# Patient Record
Sex: Male | Born: 1935 | Race: White | Hispanic: No | Smoking: Never smoker
Health system: Southern US, Community
[De-identification: ages and names within clinical notes are randomized; demographics above are authoritative.]

## PROBLEM LIST (undated history)

## (undated) DIAGNOSIS — M1990 Unspecified osteoarthritis, unspecified site: Secondary | ICD-10-CM

## (undated) DIAGNOSIS — C439 Malignant melanoma of skin, unspecified: Secondary | ICD-10-CM

## (undated) DIAGNOSIS — I10 Essential (primary) hypertension: Secondary | ICD-10-CM

## (undated) DIAGNOSIS — Z950 Presence of cardiac pacemaker: Secondary | ICD-10-CM

## (undated) DIAGNOSIS — N189 Chronic kidney disease, unspecified: Secondary | ICD-10-CM

## (undated) DIAGNOSIS — I82409 Acute embolism and thrombosis of unspecified deep veins of unspecified lower extremity: Secondary | ICD-10-CM

## (undated) DIAGNOSIS — D689 Coagulation defect, unspecified: Secondary | ICD-10-CM

## (undated) DIAGNOSIS — I4891 Unspecified atrial fibrillation: Secondary | ICD-10-CM

## (undated) HISTORY — PX: INSERT / REPLACE / REMOVE PACEMAKER: SUR710

## (undated) HISTORY — PX: CHOLECYSTECTOMY: SHX55

---

## 2004-12-12 ENCOUNTER — Ambulatory Visit: Payer: Self-pay | Admitting: Internal Medicine

## 2006-06-12 ENCOUNTER — Ambulatory Visit: Payer: Self-pay | Admitting: Internal Medicine

## 2007-07-12 ENCOUNTER — Ambulatory Visit: Payer: Self-pay | Admitting: Internal Medicine

## 2010-11-22 ENCOUNTER — Ambulatory Visit: Payer: Self-pay

## 2010-11-22 ENCOUNTER — Inpatient Hospital Stay: Payer: Self-pay | Admitting: Surgery

## 2010-11-28 LAB — PATHOLOGY REPORT

## 2011-04-07 ENCOUNTER — Ambulatory Visit: Payer: Self-pay | Admitting: Internal Medicine

## 2011-05-03 ENCOUNTER — Ambulatory Visit: Payer: Self-pay | Admitting: Gastroenterology

## 2011-09-04 ENCOUNTER — Encounter: Payer: Self-pay | Admitting: Sports Medicine

## 2011-10-04 ENCOUNTER — Encounter: Payer: Self-pay | Admitting: Sports Medicine

## 2012-09-17 ENCOUNTER — Inpatient Hospital Stay: Payer: Self-pay | Admitting: Internal Medicine

## 2012-09-17 LAB — CBC
HGB: 11.5 g/dL — ABNORMAL LOW (ref 13.0–18.0)
MCH: 33.5 pg (ref 26.0–34.0)
MCHC: 32.6 g/dL (ref 32.0–36.0)
Platelet: 133 10*3/uL — ABNORMAL LOW (ref 150–440)
RBC: 3.44 10*6/uL — ABNORMAL LOW (ref 4.40–5.90)
WBC: 9.1 10*3/uL (ref 3.8–10.6)

## 2012-09-17 LAB — COMPREHENSIVE METABOLIC PANEL
Albumin: 3.5 g/dL (ref 3.4–5.0)
Alkaline Phosphatase: 67 U/L (ref 50–136)
Anion Gap: 6 — ABNORMAL LOW (ref 7–16)
BUN: 30 mg/dL — ABNORMAL HIGH (ref 7–18)
Bilirubin,Total: 0.7 mg/dL (ref 0.2–1.0)
Calcium, Total: 8.6 mg/dL (ref 8.5–10.1)
Co2: 23 mmol/L (ref 21–32)
EGFR (African American): 43 — ABNORMAL LOW
Osmolality: 292 (ref 275–301)
Potassium: 4.2 mmol/L (ref 3.5–5.1)
Sodium: 143 mmol/L (ref 136–145)

## 2012-09-17 LAB — PROTIME-INR
INR: 2.4
Prothrombin Time: 25.8 secs — ABNORMAL HIGH (ref 11.5–14.7)

## 2012-09-17 LAB — TROPONIN I: Troponin-I: <0.02 ng/mL

## 2012-09-17 LAB — APTT: Activated PTT: 37.1 secs — ABNORMAL HIGH (ref 23.6–35.9)

## 2012-09-17 LAB — PRO B NATRIURETIC PEPTIDE: B-Type Natriuretic Peptide: 2393 pg/mL — ABNORMAL HIGH (ref 0–450)

## 2012-09-18 LAB — BASIC METABOLIC PANEL
Anion Gap: 10 (ref 7–16)
Calcium, Total: 8.7 mg/dL (ref 8.5–10.1)
Creatinine: 2.09 mg/dL — ABNORMAL HIGH (ref 0.60–1.30)
EGFR (African American): 34 — ABNORMAL LOW
EGFR (Non-African Amer.): 29 — ABNORMAL LOW
Potassium: 3.9 mmol/L (ref 3.5–5.1)
Sodium: 144 mmol/L (ref 136–145)

## 2012-09-18 LAB — URINALYSIS, COMPLETE
Bacteria: NONE SEEN
Bilirubin,UR: NEGATIVE
Blood: NEGATIVE
Ketone: NEGATIVE
Nitrite: NEGATIVE
Ph: 5 (ref 4.5–8.0)
Specific Gravity: 1.014 (ref 1.003–1.030)
Squamous Epithelial: NONE SEEN
WBC UR: 2 /HPF (ref 0–5)

## 2012-09-18 LAB — CK TOTAL AND CKMB (NOT AT ARMC)
CK, Total: 47 U/L (ref 35–232)
CK, Total: 60 U/L (ref 35–232)
CK-MB: 0.9 ng/mL (ref 0.5–3.6)
CK-MB: 1.3 ng/mL (ref 0.5–3.6)
CK-MB: 1.9 ng/mL (ref 0.5–3.6)

## 2012-09-18 LAB — PROTIME-INR
INR: 1.7
Prothrombin Time: 19.7 secs — ABNORMAL HIGH (ref 11.5–14.7)

## 2012-09-18 LAB — CBC WITH DIFFERENTIAL/PLATELET
Basophil #: 0 10*3/uL (ref 0.0–0.1)
Eosinophil %: 1.3 %
HCT: 32.5 % — ABNORMAL LOW (ref 40.0–52.0)
HGB: 10.7 g/dL — ABNORMAL LOW (ref 13.0–18.0)
Lymphocyte %: 8.2 %
MCHC: 32.8 g/dL (ref 32.0–36.0)
Monocyte #: 1.1 x10 3/mm — ABNORMAL HIGH (ref 0.2–1.0)
Neutrophil #: 9.7 10*3/uL — ABNORMAL HIGH (ref 1.4–6.5)
Platelet: 126 10*3/uL — ABNORMAL LOW (ref 150–440)
RBC: 3.22 10*6/uL — ABNORMAL LOW (ref 4.40–5.90)
RDW: 15.2 % — ABNORMAL HIGH (ref 11.5–14.5)
WBC: 12 10*3/uL — ABNORMAL HIGH (ref 3.8–10.6)

## 2012-09-18 LAB — TROPONIN I: Troponin-I: 0.52 ng/mL — ABNORMAL HIGH

## 2012-10-02 ENCOUNTER — Emergency Department: Payer: Self-pay | Admitting: Emergency Medicine

## 2012-10-02 LAB — CBC
HCT: 33.6 % — ABNORMAL LOW (ref 40.0–52.0)
MCH: 33.7 pg (ref 26.0–34.0)
MCV: 100 fL (ref 80–100)
RBC: 3.36 10*6/uL — ABNORMAL LOW (ref 4.40–5.90)
RDW: 15.1 % — ABNORMAL HIGH (ref 11.5–14.5)
WBC: 16.5 10*3/uL — ABNORMAL HIGH (ref 3.8–10.6)

## 2013-08-06 DIAGNOSIS — C4359 Malignant melanoma of other part of trunk: Secondary | ICD-10-CM | POA: Insufficient documentation

## 2013-11-07 DIAGNOSIS — G629 Polyneuropathy, unspecified: Secondary | ICD-10-CM

## 2013-11-07 DIAGNOSIS — G608 Other hereditary and idiopathic neuropathies: Secondary | ICD-10-CM | POA: Insufficient documentation

## 2013-11-07 DIAGNOSIS — I10 Essential (primary) hypertension: Secondary | ICD-10-CM | POA: Insufficient documentation

## 2013-11-07 DIAGNOSIS — M179 Osteoarthritis of knee, unspecified: Secondary | ICD-10-CM | POA: Insufficient documentation

## 2013-11-07 DIAGNOSIS — M1710 Unilateral primary osteoarthritis, unspecified knee: Secondary | ICD-10-CM | POA: Insufficient documentation

## 2014-07-24 ENCOUNTER — Observation Stay: Payer: Self-pay | Admitting: Internal Medicine

## 2014-07-27 DIAGNOSIS — E782 Mixed hyperlipidemia: Secondary | ICD-10-CM | POA: Insufficient documentation

## 2014-09-25 NOTE — Op Note (Signed)
PATIENT NAME:  Howard Harmon, Howard Harmon MR#:  [id number] DATE OF BIRTH:  March 01, 1930  DATE OF PROCEDURE:  09/18/2012  SURGEON: Isaias Cowman, MD  PRIMARY CARE PHYSICIAN: Rusty Aus, MD  PREPROCEDURE DIAGNOSIS: Complete heart block.   PROCEDURE: Dual-chamber pacemaker implantation.  POSTPROCEDURE DIAGNOSIS: Atrial sensing with ventricular pacing.   INDICATION: The patient is an 81 year old gentleman with history of hypertension,  hyperlipidemia, DVT and pulmonary emboli. The patient presented to [facility] Emergency Room with a 3-day history of generalized weakness and shortness of breath. EKG revealed complete heart block with a ventricular escape rate in the 30s. The procedure, risks, benefits and alternatives of permanent pacemaker implantation were explained to the patient and informed written consent was obtained.   DESCRIPTION OF PROCEDURE: He was brought to the Operating Room in a fasting state. The left pectoral region was prepped and draped in the usual sterile manner. Anesthesia was obtained with 1% Xylocaine locally. A 6 cm incision was performed over the left pectoral region. Pacemaker pocket was generated by electrocautery and blunt dissection. Access was obtained to the left subclavian vein by fine-needle aspiration. Right ventricular and right atrial leads were positioned in the right ventricular apex and right atrial appendage under fluoroscopic guidance. After proper thresholds were obtained, the leads were sutured in place. The leads were then connected to a dual-chamber rate responsive pacemaker generator (Medtronic Adapta [id number]). The pacemaker pocket was irrigated with gentamicin solution. The pacemaker generator was positioned into the pocket and the pocket was closed with 2-0 and 4-0 Vicryl, respectively. Steri-Strips and pressure dressing were applied.   ____________________________ Isaias Cowman, MD ap:jm D: 09/18/2012 13:38:52 ET T: 09/18/2012 13:44:42  ET JOB#: [id number]  cc: Isaias Cowman, MD, <Dictator> Isaias Cowman MD ELECTRONICALLY SIGNED 10/14/2012 13:42

## 2014-09-25 NOTE — H&P (Signed)
PATIENT NAME:  Howard Harmon, Howard Harmon MR#:  [id number] DATE OF BIRTH:  1930/05/28  DATE OF ADMISSION:  09/17/2012  PRIMARY CARE PHYSICIAN: Emily Filbert, MD   CHIEF COMPLAINT: Generalized weakness, bradycardia.   HISTORY OF PRESENT ILLNESS: The patient is an 81 year old white male who has a history of hypertension, history of neuropathy, history of PE, DVT, hyperlipidemia, history of tachycardia who reports that he was feeling very tired and fatigued for the past 2 to 3 days. He has not had any shortness of breath or chest pain. His daughter is a Marine scientist, so she decided to check his pulse and his vitals, and he was noted to be very bradycardic, so she brought him to the ED.  The patient in the ED was noted to have idioventricular rhythm with heart rate in the 30s, likely has third-degree heart block. The ED physician spoke to Dr. Humphrey Rolls, who was on call, who came and saw the patient.  He recommends the patient be admitted to the ICU and, if needed, can have transcutaneous pacemaker overnight as well as IV dobutamine, if needed. The patient currently feels okay, denies any chest pains or palpitations. No syncope, denies any abdominal pain, nausea, vomiting, diarrhea or fevers.   PAST MEDICAL HISTORY: Significant for: 1. Hypertension.  2. Has chronic neuropathy.  3. History of having DVT and PE.  4. History of hyperlipidemia.  5. History of tachycardia.   PAST SURGICAL HISTORY:  Status post cholecystectomy.   ALLERGIES: None.   MEDICATIONS: Amlodipine 5 daily, metoprolol 12.5 p.o. daily, prednisone 2.5 mg daily, tramadol 200, 1 tab p.o. b.i.d., vitamin B12 1000 mcg daily, vitamin C 1 tab p.o. daily, vitamin D3 1 tab p.o. daily, warfarin 6 mg Monday through Friday, warfarin 3 mg on Saturday and Sunday.   SOCIAL HISTORY: History of smoking a long time ago, does not smoke. He lives with his wife. No alcohol or drug use.   FAMILY HISTORY: Positive for hypertension.   REVIEW OF SYSTEMS:   CONSTITUTIONAL:  Complains of fatigue, weakness. No pain. No weight loss, no weight gain.  EYES: No blurred or double vision. No redness. No inflammation. No glaucoma. No cataracts.  ENT: No tinnitus. No ear pain. No hearing loss. No seasonal or year-round allergies. No difficulty swallowing.  RESPIRATORY: Denies any cough, wheezing. No hemoptysis.  CARDIOVASCULAR: Denies any chest pains, orthopnea or edema. Has a history of tachycardia. Denies any syncope.  GASTROINTESTINAL: No nausea, vomiting, diarrhea. No abdominal pain. No hematemesis. No melena.  GENITOURINARY: Denies any dysuria, hematuria, renal calculus or frequency.  ENDOCRINE:  Denies any polyuria, nocturia or thyroid problems.  HEMATOLOGIC/LYMPHATIC: Denies anemia, easy bruisability or bleeding.  SKIN: No acne. No rash. No changes in mole, hair or skin.  MUSCULOSKELETAL: Denies any pain in the neck, back or shoulder.  NEUROLOGIC: No numbness. No CVA. No TIA.  PSYCHIATRIC: No anxiety. No insomnia. No ADD.   PHYSICAL EXAMINATION: VITAL SIGNS: Temperature 98.6, pulse 39, respirations 20, blood pressure 150/68, O2 99%.  GENERAL: The patient is an elderly white male, well developed.  HEENT: Head atraumatic, normocephalic. Pupils are equally round, reactive to light and accommodation. Extraocular movements are intact. There is no conjunctival pallor. No scleral icterus. Nasal exam shows no drainage or ulceration. Oropharynx: Clear without any exudate.  NECK: No thyromegaly. No carotid bruits.  CARDIOVASCULAR: Regular rate and rhythm. No murmurs, rubs, clicks or gallops. PMI is not displaced.  ABDOMEN: Soft, nontender, nondistended. Positive bowel sounds x 4.  EXTREMITIES: No clubbing, cyanosis or  edema.  SKIN: No rash.  LYMPHATICS: No lymph nodes palpable.  VASCULAR: Good DP, PT pulses.  PSYCHIATRIC: Not anxious or depressed.  NEUROLOGICAL: Awake, alert, oriented x 3. No focal deficits. Cranial nerves II through XII are grossly intact.    EVALUATIONS: EKG shows idioventricular rhythm with some right bundle branch block, inferior infarct.   ASSESSMENT AND PLAN: The patient is an 80 year old white male with history of hypertension, history of tachycardia, PE, DVT who presents with weakness, noted to have severe bradycardia, likely severe bradycardia with third-degree AV block.  The patient was seen by Cardiology.   1. Third-degree arteriovenous block:  At this time the patient, based on his diagnosis, is critically ill and is at risk of cardiac arrest. We will monitor him in the ICU. He may need external pacing.  Also, if his heart drops can use dobutamine, if needed. He was seen by Dr. Humphrey Rolls, of cardiology, who recommends  consulting  Dr. Saralyn Pilar in the a.m. for a pacemaker. He states that, if needed, he can put in a temporary pacemaker in the cath lab as well in the morning.  2. Hypertension:  We will hold his antihypertensives, including his beta blocker. 3. History of pulmonary embolus:  Hold Coumadin.  Vitamin K will be given tonight. The patient may need FFP if decided to put pacemaker in tomorrow. We will repeat INR in the a.m.   CODE STATUS:  FULL.     TIME SPENT: 45 minutes of critical care time spent due to patient's severity of illness; at this time he is critical.   ____________________________ Lafonda Mosses. Posey Pronto, MD shp:cb D: 09/17/2012 22:35:02 ET T: 09/17/2012 23:21:28 ET JOB#: [id number]  cc: Mykeal Carrick H. Posey Pronto, MD, <Dictator> Alric Seton MD ELECTRONICALLY SIGNED 09/29/2012 13:02

## 2014-09-25 NOTE — Discharge Summary (Signed)
PATIENT NAME:  Howard Harmon, HERSCH MR#:  [id number] DATE OF BIRTH:  [birth year]  DATE OF ADMISSION:  09/17/2012 DATE OF DISCHARGE:  09/19/2012  DISCHARGE DIAGNOSES: 1.  Complete heart block with syncope.  2.  Hypertension.  3.  Chronic renal insufficiency.  4.  Severe peripheral neuropathy.   DISCHARGE MEDICATIONS: Amlodipine 5 mg daily, Keflex 250 mg t.i.d., warfarin 6 mg at bedtime, AcipHex 20 mg daily, tramadol 200 mg b.i.d., Vitron-C daily.   REASON FOR ADMISSION: An 81 year old male presents with bradycardia, weakness and syncope. Please see H and P for HPI, past medical history and physical exam.   HOSPITAL COURSE: The patient was admitted, a temporary pacemaker placed. He underwent biventricular pacemaker placement per Dr. Saralyn Pilar without apparent complication.  He did well, felt better.    DISPOSITION:  Discharged to home on the Keflex, starting Coumadin as an outpatient.  ____________________________ Rusty Aus, MD mfm:cb D: 10/01/2012 19:16:36 ET T: 10/01/2012 20:40:48 ET JOB#: [id number]  cc: Rusty Aus, MD, <Dictator> Shirelle Tootle Roselee Culver MD ELECTRONICALLY SIGNED 10/08/2012 8:10

## 2014-09-25 NOTE — Consult Note (Signed)
PATIENT NAME:  Howard Harmon, FRISBEE MR#:  [id number] DATE OF BIRTH:  1929/07/29  DATE OF CONSULTATION:  09/17/2012  CONSULTING PHYSICIAN:  Dionisio David, MD  HISTORY OF PRESENT ILLNESS:  This is an 81 year old white male with a past medical history of hypertension, hyperlipidemia, history of DVT and history of pulmonary emboli. Came into the hospital because for the last 3 days he been feeling very weak, short of breath and occasional dizziness, but no syncope or passing out spells. In the Emergency Room, the patient was found to have heart rate of 32. EKG was done, which revealed idioventricular rhythm at a rate of 32 beats per minute; thus, I was asked to evaluate the patient. The patient denies any chest pain, shortness of breath, but just feels weak, and he is alert and oriented.   PAST MEDICAL HISTORY:  As mentioned, history of hypertension, hyperlipidemia, history of pulmonary emboli, history of DVT diagnosed on Oct 22, 1998, history of pulmonary emboli July 26, 2003 and has been on Coumadin.     MEDICATIONS:  Metoprolol 25 mg half tablet a day, amlodipine 5 mg once a day, Coumadin 6 mg 1 tablet Monday through Friday and half tablet Saturday and Sunday.  Other medications are vitamins.   SOCIAL HISTORY:  Denies EtOH abuse or smoking.   FAMILY HISTORY:  Unremarkable.   PHYSICAL EXAMINATION: GENERAL:  He is alert, oriented x3.  VITAL SIGNS:  His heart rate right now is 35, appears to be idioventricular rhythm. His blood pressure right now 120/70, respirations 14. He is afebrile.  NECK:  Revealed no JVD.  LUNGS:  Good air entry. No rales.  ABDOMEN:  Soft, nontender, positive bowel sounds.  EXTREMITIES:  No pedal edema.  NEUROLOGIC:  The patient appears to be intact.   LABORATORY DATA, DIAGNOSTICS:  EKG shows heart rate 32, idioventricular rhythm with third degree AV block. BUN and creatinine are 30/1.72, glucose is 119. His troponin is less than 0.02. His BNP is 2393. Magnesium is 2. His  hemoglobin is 11.5, hematocrit 35.3, white count 9.1. His INR is 2.4, PT is 25.8.   ASSESSMENT AND PLAN:  The patient has third degree AV block with a heart rate of 32, idioventricular rhythm. The patient appears to be alert and oriented, maintaining his blood pressure. This has been going on for 2 to 3 days. We will advise holding metoprolol, also advise holding Coumadin. His INR is 2.4. We will give vitamin K 10 mg IV. Also, he probably needs a temporary pacemaker, but since he is maintaining his blood pressure, we will get a consult with Dr. Saralyn Pilar and put a permanent pacemaker in.   Thank you very much for the referral.    ____________________________ Dionisio David, MD sak:dmm D: 09/17/2012 22:04:00 ET T: 09/17/2012 22:22:21 ET JOB#: [id number]  cc: Dionisio David, MD, <Dictator> Dionisio David MD ELECTRONICALLY SIGNED 09/27/2012 8:50

## 2014-10-04 NOTE — H&P (Signed)
PATIENT NAME:  Howard Harmon, Howard Harmon MR#:  [id number] DATE OF BIRTH:  11-26-1929  DATE OF ADMISSION:  07/24/2014  PRIMARY CARE PHYSICIAN: Rusty Aus, MD  PRIMARY CARDIOLOGIST: Javier Docker. Ubaldo Glassing, MD  REFERRING EMERGENCY ROOM PHYSICIAN: Yetta Numbers. Karma Greaser, MD   CHIEF COMPLAINT: Chest pain.   HISTORY OF PRESENTING ILLNESS: This is an 81 year old male who has a history of hypertension, chronic neuropathy, DVT, hyperlipidemia, who lives home with wife and active for his life. Yesterday, when he was sitting in the chair, he accidently was sliding off from the front, so he tried to support his body by holding the chair at his back and preventing himself from falling down on the floor. He did not have any major injuries. Today (morning), he noticed the left side of his chest was hurting and the pain was 7 to 8 out of 10, sharp with each deep breath and constant since morning until now. The pain goes down to 3 to 4 out of 10 if he does not take deep breaths and take normal breaths. Concerning for this, family brought him to the Emergency Room. His first troponin is negative, but because of his other history, ER physician decided to bring him in for admission to rule out cardiac event. On further questioning, he denies any cough or fever. He denies any shortness of breath with this episode.   REVIEW OF SYSTEMS:  CONSTITUTIONAL: Negative for fever, fatigue, weakness, pain, or weight loss.  EYES: No blurring, double vision, discharge, or redness.  EARS, NOSE, THROAT: No tinnitus, ear pain, or hearing loss.  RESPIRATORY: No cough, wheezing, hemoptysis, or shortness of breath.  CARDIOVASCULAR: The patient has some chest pain. No orthopnea, edema, arrhythmia, or palpitations.  GASTROINTESTINAL: No nausea, vomiting, diarrhea, or abdominal pain.  GENITOURINARY: No dysuria, hematuria, or increased frequency.  ENDOCRINE: No heat or cold intolerance. No excessive sweating.  SKIN: No acne, rashes, or lesions.   MUSCULOSKELETAL: No pain or swelling in the joints.  NEUROLOGICAL: No numbness, weakness, tremor, or vertigo.  PSYCHIATRIC: No anxiety, insomnia, or bipolar disorder.   PAST MEDICAL HISTORY:  1.  Hypertension.  2.  Chronic neuropathy.  3.  History of DVT and PE.  4.  Hyperlipidemia.  5.  History of bradycardia, status post pacemaker.   PAST SURGICAL HISTORY: Status post cholecystectomy and status post pacemaker placement.   FAMILY HISTORY: Positive for hypertension.   SOCIAL HISTORY: He never smoked. No drinking or illegal drug use. Lives with wife and requires a cane to walk.   HOME MEDICATIONS: Still need to be completed by pharmacy technician.   PHYSICAL EXAMINATION:  VITAL SIGNS: In the ER, temperature 97.7, pulse 70, respirations 20, blood pressure 117/81, and pulse oximetry is 96% on room air.  GENERAL: The patient is fully alert and oriented to time, place, and person. Does not appear in any acute distress.  HEENT: Head and neck atraumatic. Conjunctivae are pink. Oral mucosa moist.  NECK: Supple. No JVD.  RESPIRATORY: Bilateral equal and clear air entry.  CARDIOVASCULAR: S1, S2 present, regular. No murmur. Mild tenderness on his left upper chest. He has a pacemaker present.  ABDOMEN: Soft, nontender. Bowel sounds present. No organomegaly felt.  SKIN: No acne, rashes, or lesions.  NEUROLOGICAL: Power 5/5. No tremor or rigidity. Follows commands. Moves all 4 limbs. Sensation is intact. Legs: No edema.  JOINTS: No swelling or tenderness.  PSYCHIATRIC: He does not appear in any acute psychiatric illness at this time.   IMPORTANT LABORATORY DATA: Echocardiogram  in April 2014 shows left ventricular ejection fraction 75%, impaired relaxation pattern of diastolic filling, moderate elevated pulmonary artery systolic pressure. Other labs: Glucose 119, BUN  16, creatinine 1.56, sodium 141, potassium 4.5, chloride 109, CO2 of 25, calcium 8.7, and magnesium 1.8. Lipase is 102. Total  protein 6.8, albumin 3.2, bilirubin 1.1, alkaline phosphatase 69, SGOT 26, and SGPT 26. Troponin less than 0.02. WBC 15.6, hemoglobin 11.4, platelet count 202,000, MCV is 97. INR is 2.8 and activated PTT 44.1. His creatinine in the past was 1.72 in April 2014.   ASSESSMENT AND PLAN: An 81 year old male who has a past history of hypertension, chronic neuropathy, deep vein thrombosis and pulmonary embolism, hyperlipidemia, tachycardia, and chronic renal failure, who came to the Emergency Room with some chest pain.  1.  Chest pain: We will admit to telemetry, follow serial troponins, and get a cardiology evaluation per his primary cardiologist. Most likely this might be muscular pain, but because of his age, hypertension, and hyperlipidemia issues would like to make sure.  2.  Hypertension: We will continue furosemide as he is taking at home.  3.  Hyperlipidemia: He takes Omega-Q at home. We will check his lipid panel over here.  4.  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 We will continue over here and pharmacy to adjust the dose.  5.  Chronic renal failure: This appears to be stable. We will continue monitoring.   CODE STATUS: FULL CODE. The patient's healthcare POWER OF ATTORNEY would be his wife or his daughter will make decisions. He would like to have all resuscitation in any adverse event.   TOTAL TIME SPENT ON THIS ADMISSION: 50 minutes.    ____________________________ Ceasar Lund Anselm Jungling, MD vgv:ts D: 07/24/2014 21:06:12 ET T: 07/24/2014 21:19:55 ET JOB#: [id number]  cc: Ceasar Lund. Anselm Jungling, MD, <Dictator> Rusty Aus, MD Javier Docker. Ubaldo Glassing, MD Vaughan Basta MD ELECTRONICALLY SIGNED 08/17/2014 12:51

## 2014-10-04 NOTE — H&P (Signed)
PATIENT NAME:  Howard Harmon, Howard Harmon MR#:  [id number] DATE OF BIRTH:  May 16, 1930  DATE OF ADMISSION:  07/24/2014  ADDENDUM  HOME MEDICATIONS:  The patient takes: 1.  Coumadin 6 mg once a day Monday through Friday and takes 1/2 tablet on Saturday and Sunday.  2.  Vitamin D3 one tablet once a day.  3.  Vitamin C 1 tablet once a day.   4.  Vitamin B12 at 1000 mcg once a day.  5.  Tramadol 50 mg oral tablet 2 tablets 3 times a day.  6.  Omega Q 1 capsule once a day in the morning.  7.  Furosemide 20 mg oral once a day as needed for edema.  8.  Aciphex 20 mg oral delayed-release 2 times a day.     ____________________________ Ceasar Lund Anselm Jungling, MD vgv:at D: 07/24/2014 21:26:56 ET T: 07/24/2014 21:51:21 ET JOB#: [id number]  cc: Ceasar Lund. Anselm Jungling, MD, <Dictator> Vaughan Basta MD ELECTRONICALLY SIGNED 08/17/2014 12:51

## 2014-10-04 NOTE — Discharge Summary (Signed)
PATIENT NAME:  Howard Harmon, CAMMARATA MR#:  [id number] DATE OF BIRTH:  03-15-1930  DATE OF ADMISSION:  07/24/2014 DATE OF DISCHARGE:  07/26/2014  DISCHARGE DIAGNOSES:  1.  Chest pain, likely musculoskeletal. 2.  Hypertension.  3.  Chronic kidney disease.   HISTORY OF PRESENT ILLNESS: Please see initial history and physical for details. Briefly, the patient admitted with chest pain after he slipped out of his chair. Chest x-ray and troponins were negative. The patient was seen by cardiology. Most likely this is musculoskeletal pain. He was treated with tramadol. His symptoms improved and he ambulated well.   PLAN: Will be to discharge him home with outpatient follow up with cardiology as well as primary care. He may need a functional stress test in the future given his other risk factors, however, he remains stable.   His only new medication will be Norco in addition to tramadol for his pain.   FOLLOW UP:  He will follow up with Dr. Ubaldo Glassing within 1-2 weeks.   TIME SPENT: This discharge took 35 minutes.   ____________________________ Cheral Marker. Ola Spurr, MD dpf:mc D: 07/26/2014 11:13:15 ET T: 07/26/2014 11:58:52 ET JOB#: [id number]  cc: Cheral Marker. Ola Spurr, MD, <Dictator> Derrik Mceachern Ola Spurr MD ELECTRONICALLY SIGNED 07/28/2014 21:27

## 2014-10-04 NOTE — Consult Note (Signed)
PATIENT NAME:  Howard Harmon, RUNKLE MR#:  [id number] DATE OF BIRTH:  Apr 09, 1930  DATE OF CONSULTATION:  07/25/2014  REFERRING PHYSICIAN:  Doylene Canning, MD  CONSULTING PHYSICIAN:  Alver Leete D. Clayborn Bigness, MD  PRIMARY CARE PHYSICIAN:  Emily Filbert, MD  CARDIOLOGIST:  Javier Docker. Ubaldo Glassing, MD  INDICATION: Chest pain.  HISTORY OF PRESENT ILLNESS:  The patient is an 81 year old male with a history of hypertension, chronic neuropathy, DVT, hyperlipidemia, who lives with his wife, states that he fell while sitting on a chair and accidentally slid off. The patient complains of chest pain and arm pain, especially when he takes a deep breath.  There is no pain with palpating his chest.  He has a history of a permanent pacemaker and was concerned that the pacemaker being pulled out of place.  He denies any blackout spells or syncope. Cardiac enzymes were negative.  EKG was unremarkable, but he was admitted for evaluation because of the left-sided chest pain.   REVIEW OF SYSTEMS: No blackout spells or syncope. No nausea or vomiting. No fever, no chills, no sweats, no weight loss or weight gain. No hemoptysis or hematemesis. No bright red blood per rectum. No vision change or hearing change. Denies sputum production. Denies cough. Complains of recent fall and chest pain.   PAST MEDICAL HISTORY: Hypertension, chronic neuropathy, deep vein thrombosis, hyperlipidemia, bradycardia.   PAST SURGICAL HISTORY: Permanent pacemaker placement, cholecystectomy.   FAMILY HISTORY: Hypertension.   SOCIAL HISTORY: No smoking or alcohol consumption. Lives with his wife. He uses a cane to walk.  MEDICATION:  Reportedly were Coumadin 6 mg Monday through Friday and 1/2 tablet Saturday and Sunday; vitamin D, vitamin C, vitamin B12, all once a day; tramadol 50 mg 2 tablets 3 times a day,  Omega 1 capsule every day, Lasix 20 mg a day, Aciphex 20 mg twice a day.   PHYSICAL EXAMINATION:  VITAL SIGNS: Blood pressure 115/80. Pulse was 70 and  regular, respiratory rate 16, afebrile.  HEENT: Normocephalic, atraumatic. Pupils equal and reactive to light.  NECK: Supple. No significant JVD, bruits or adenopathy.  LUNGS: Clear to auscultation and percussion. No significant wheeze, rhonchi, or rale.  HEART: Regular rate and rhythm. No significant murmur, gallops, or rubs.  ABDOMEN: Benign.  SKIN:  Normal.  LABORATORY DATA:: Echocardiogram in 2014 showed an ejection fraction of 75%.  Glucose 119, BUN of 16, creatinine 1.56, sodium 141, potassium 4.5, chloride 109, CO2 of 25. LFTs negative. Troponin less than 0.2. White count 15.6, hemoglobin 11.4, platelet count of 202,000. INR 2.8, PTT 44, creatinine 1.72 in the past, now is 1.56.   CHEST X-RAY: with pacemaker in place.   ASSESSMENT: Chest pain, atypical, hypertension, hyperlipidemia, bradycardia, and recurrent deep vein thrombosis, chronic renal insufficiency, a recent fall.   PLAN:  1.  Agree with admit and observation for chest pain symptoms, especially with deep inspiration. I am worried that this is a musculoskeletal pain syndrome and pain medications and physical therapy would be helpful as well as muscle relaxants.  I do not recommend invasive evaluation including cardiac catheterization. Would consider a functional study may be a stress test at some point, but I believe this is all related to his fall.  2.  Continue hypertension control with usual medications.  3.  Continue pacemaker control. We will consider evaluation and pacemaker interrogation.  4.  Continue lipid management. He takes Omega-Q, consider statin therapy.  5.  Continue Coumadin for deep vein thrombosis therapy.  6.  Renal insufficiency. Appears  to be at least stage III, would recommend nephrology input. Continue current therapy, increase ambulation. Consider physical therapy for what I consider to be musculoskeletal pain. Do not recommend any invasive cardiac studies at this  point.   ____________________________ Loran Senters. Clayborn Bigness, MD ddc:DT D: 07/25/2014 15:59:43 ET T: 07/25/2014 16:31:06 ET JOB#: [id number]  cc: Ellar Hakala D. Clayborn Bigness, MD, <Dictator> Yolonda Kida MD ELECTRONICALLY SIGNED 08/23/2014 17:32

## 2014-10-04 NOTE — Consult Note (Signed)
Chief Complaint:  Subjective/Chief Complaint Patient states chest pain better but  complains of shoulder discomfort. patient had a fall from a chair and complains of generalized soreness.   VITAL SIGNS/ANCILLARY NOTES: **Vital Signs.:   21-Feb-16 05:32  Vital Signs Type Routine  Temperature Temperature (F) 97.9  Celsius 36.6  Temperature Source oral  Pulse Pulse 78  Respirations Respirations 20  Systolic BP Systolic BP 139  Diastolic BP (mmHg) Diastolic BP (mmHg) 76  Mean BP 97  Pulse Ox % Pulse Ox % 96  Pulse Ox Activity Level  At rest  Oxygen Delivery 1L  *Intake and Output.:   Daily 21-Feb-16 07:00  Grand Totals Intake:  480 Output:  200    Net:  280 24 Hr.:  280  Oral Intake      In:  480  Urine ml     Out:  200  Length of Stay Totals Intake:  480 Output:  200    Net:  280   Brief Assessment:  GEN well developed, well nourished, no acute distress   Cardiac Regular   Respiratory normal resp effort  clear BS   Gastrointestinal Normal   Gastrointestinal details normal Soft  Nontender  Nondistended  No masses palpable   Lab Results: Routine Chem:  20-Feb-16 02:14   Glucose, Serum  127  BUN  22  Creatinine (comp)  1.74  Sodium, Serum 142  Potassium, Serum 4.5  Chloride, Serum  110  CO2, Serum 25  Calcium (Total), Serum 8.6  Anion Gap 7  Osmolality (calc) 288  eGFR (African American)  49  eGFR (Non-African American)  40 (eGFR values <41mL/min/1.73 m2 may be an indication of chronic kidney disease (CKD). Calculated eGFR, using the MRDR Study equation, is useful in  patients with stable renal function. The eGFR calculation will not be reliable in acutely ill patients when serum creatinine is changing rapidly. It is not useful in patients on dialysis. The eGFR calculation may not be applicable to patients at the low and high extremes of body sizes, pregnant women, and vegetarians.)  Cardiac:  20-Feb-16 02:14   CPK-MB, Serum 0.6 (Result(s) reported on 25 Jul 2014 at 02:55AM.)  Troponin I < 0.02 (0.00-0.05 0.05 ng/mL or less: NEGATIVE  Repeat testing in 3-6 hrs  if clinically indicated. >0.05 ng/mL: POTENTIAL  MYOCARDIAL INJURY. Repeat  testing in 3-6 hrs if  clinically indicated. NOTE: An increase or decrease  of 30% or more on serial  testing suggests a  clinically important change)  Routine Hem:  20-Feb-16 02:14   WBC (CBC)  13.7  RBC (CBC)  3.42  Hemoglobin (CBC)  10.4  Hematocrit (CBC)  32.4  Platelet Count (CBC) 167  MCV 95  MCH 30.5  MCHC 32.1  RDW  17.1  Neutrophil % 79.3  Lymphocyte % 8.9  Monocyte % 11.4  Eosinophil % 0.2  Basophil % 0.2  Neutrophil #  10.8  Lymphocyte # 1.2  Monocyte #  1.6  Eosinophil # 0.0  Basophil # 0.0 (Result(s) reported on 25 Jul 2014 at 02:31AM.)   Assessment/Plan:  Assessment/Plan:  Assessment status post fall  musculoskeletal pain  sick sinus syndrome  bradycardia  DJD  hypertension  hyperlipidemia  history of DVT  renal insufficiency .   Plan continue pain control as necessary for shoulder discomfort  recommend increase incentive  spirometry  pain control for chest discomfort   consider evaluation by Nephrology for renal insufficiency  continue hypertension control  continue therapy for hyperlipidemia  increase activity  continue anticoagulation for history of DVT  consider functional study as an outpatient  outpatient follow-up with Cardiology as an outpatient   Electronic Signatures: Yolonda Kida (MD)  (Signed 21-Feb-16 11:42)  Authored: Chief Complaint, VITAL SIGNS/ANCILLARY NOTES, Brief Assessment, Lab Results, Assessment/Plan   Last Updated: 21-Feb-16 11:42 by Lujean Amel D (MD)

## 2014-10-12 ENCOUNTER — Inpatient Hospital Stay
Admission: EM | Admit: 2014-10-12 | Discharge: 2014-10-15 | DRG: 378 | Disposition: A | Discharge status: Home / Self Care | Payer: Medicare Other | Attending: Internal Medicine | Admitting: Internal Medicine

## 2014-10-12 ENCOUNTER — Emergency Department: Payer: Medicare Other

## 2014-10-12 DIAGNOSIS — K648 Other hemorrhoids: Secondary | ICD-10-CM | POA: Diagnosis present

## 2014-10-12 DIAGNOSIS — K5751 Diverticulosis of both small and large intestine without perforation or abscess with bleeding: Principal | ICD-10-CM | POA: Diagnosis present

## 2014-10-12 DIAGNOSIS — Z85820 Personal history of malignant melanoma of skin: Secondary | ICD-10-CM

## 2014-10-12 DIAGNOSIS — M1990 Unspecified osteoarthritis, unspecified site: Secondary | ICD-10-CM | POA: Diagnosis present

## 2014-10-12 DIAGNOSIS — D500 Iron deficiency anemia secondary to blood loss (chronic): Secondary | ICD-10-CM | POA: Diagnosis present

## 2014-10-12 DIAGNOSIS — K635 Polyp of colon: Secondary | ICD-10-CM | POA: Diagnosis present

## 2014-10-12 DIAGNOSIS — Z86711 Personal history of pulmonary embolism: Secondary | ICD-10-CM

## 2014-10-12 DIAGNOSIS — Z79899 Other long term (current) drug therapy: Secondary | ICD-10-CM | POA: Diagnosis not present

## 2014-10-12 DIAGNOSIS — N183 Chronic kidney disease, stage 3 unspecified: Secondary | ICD-10-CM | POA: Diagnosis present

## 2014-10-12 DIAGNOSIS — K922 Gastrointestinal hemorrhage, unspecified: Secondary | ICD-10-CM | POA: Diagnosis present

## 2014-10-12 DIAGNOSIS — D62 Acute posthemorrhagic anemia: Secondary | ICD-10-CM | POA: Diagnosis present

## 2014-10-12 DIAGNOSIS — E785 Hyperlipidemia, unspecified: Secondary | ICD-10-CM | POA: Diagnosis present

## 2014-10-12 DIAGNOSIS — Z86718 Personal history of other venous thrombosis and embolism: Secondary | ICD-10-CM

## 2014-10-12 DIAGNOSIS — G629 Polyneuropathy, unspecified: Secondary | ICD-10-CM | POA: Diagnosis present

## 2014-10-12 DIAGNOSIS — I129 Hypertensive chronic kidney disease with stage 1 through stage 4 chronic kidney disease, or unspecified chronic kidney disease: Secondary | ICD-10-CM | POA: Diagnosis present

## 2014-10-12 HISTORY — DX: Essential (primary) hypertension: I10

## 2014-10-12 HISTORY — DX: Coagulation defect, unspecified: D68.9

## 2014-10-12 HISTORY — DX: Malignant melanoma of skin, unspecified: C43.9

## 2014-10-12 LAB — COMPREHENSIVE METABOLIC PANEL
ALT: 19 U/L (ref 17–63)
AST: 22 U/L (ref 15–41)
Albumin: 3.4 g/dL — ABNORMAL LOW (ref 3.5–5.0)
Alkaline Phosphatase: 52 U/L (ref 38–126)
Anion gap: 9 (ref 5–15)
BUN: 31 mg/dL — ABNORMAL HIGH (ref 6–20)
CALCIUM: 9.1 mg/dL (ref 8.9–10.3)
CO2: 24 mmol/L (ref 22–32)
CREATININE: 1.66 mg/dL — AB (ref 0.61–1.24)
Chloride: 110 mmol/L (ref 101–111)
GFR calc non Af Amer: 37 mL/min — ABNORMAL LOW (ref >60)
GFR, EST AFRICAN AMERICAN: 43 mL/min — AB (ref >60)
GLUCOSE: 133 mg/dL — AB (ref 65–99)
Potassium: 4.7 mmol/L (ref 3.5–5.1)
Sodium: 143 mmol/L (ref 135–145)
Total Bilirubin: 0.9 mg/dL (ref 0.3–1.2)
Total Protein: 5.9 g/dL — ABNORMAL LOW (ref 6.5–8.1)

## 2014-10-12 LAB — CBC
HEMATOCRIT: 30 % — AB (ref 40.0–52.0)
HEMOGLOBIN: 9.5 g/dL — AB (ref 13.0–18.0)
MCH: 30.1 pg (ref 26.0–34.0)
MCHC: 31.8 g/dL — ABNORMAL LOW (ref 32.0–36.0)
MCV: 94.7 fL (ref 80.0–100.0)
Platelets: 176 10*3/uL (ref 150–440)
RBC: 3.17 MIL/uL — ABNORMAL LOW (ref 4.40–5.90)
RDW: 17.2 % — AB (ref 11.5–14.5)
WBC: 11.3 10*3/uL — AB (ref 3.8–10.6)

## 2014-10-12 LAB — PROTIME-INR
INR: 1.12
Prothrombin Time: 14.6 seconds (ref 11.4–15.0)

## 2014-10-12 LAB — ABO/RH: ABO/RH(D): A POS

## 2014-10-12 LAB — TYPE AND SCREEN
ABO/RH(D): A POS
ANTIBODY SCREEN: NEGATIVE

## 2014-10-12 LAB — TROPONIN I: TROPONIN I: 0.04 ng/mL — AB (ref <0.031)

## 2014-10-12 LAB — APTT: aPTT: 25 seconds (ref 24–36)

## 2014-10-12 LAB — HEMOGLOBIN: Hemoglobin: 9 g/dL — ABNORMAL LOW (ref 13.0–18.0)

## 2014-10-12 MED ORDER — SODIUM CHLORIDE 0.9 % IV BOLUS (SEPSIS)
500.0000 mL | Freq: Once | INTRAVENOUS | Status: AC
  Administered 2014-10-12: 500 mL via INTRAVENOUS

## 2014-10-12 MED ORDER — ONDANSETRON HCL 4 MG/2ML IJ SOLN: 4.0000 mg | Freq: Four times a day (QID) | INTRAMUSCULAR | Status: DC | PRN

## 2014-10-12 MED ORDER — ALBUTEROL SULFATE (2.5 MG/3ML) 0.083% IN NEBU
2.5000 mg | INHALATION_SOLUTION | RESPIRATORY_TRACT | Status: DC | PRN
  Filled 2014-10-12: qty 3

## 2014-10-12 MED ORDER — ACETAMINOPHEN 650 MG RE SUPP
650.0000 mg | Freq: Four times a day (QID) | RECTAL | Status: DC | PRN
  Filled 2014-10-12: qty 1

## 2014-10-12 MED ORDER — ACETAMINOPHEN 325 MG PO TABS
650.0000 mg | ORAL_TABLET | Freq: Four times a day (QID) | ORAL | Status: DC | PRN
  Filled 2014-10-12: qty 2

## 2014-10-12 MED ORDER — PANTOPRAZOLE SODIUM 40 MG IV SOLR
40.0000 mg | Freq: Two times a day (BID) | INTRAVENOUS | Status: DC
  Administered 2014-10-13 – 2014-10-14 (×4): 40 mg via INTRAVENOUS
  Filled 2014-10-12 (×6): qty 40

## 2014-10-12 MED ORDER — ONDANSETRON HCL 4 MG PO TABS
4.0000 mg | ORAL_TABLET | Freq: Four times a day (QID) | ORAL | Status: DC | PRN
  Filled 2014-10-12: qty 1

## 2014-10-12 NOTE — ED Notes (Signed)
Pt c/o rectal bleeding with diarrhea for the past 2 days with generalized weakness

## 2014-10-12 NOTE — H&P (Signed)
[facility] [place] NAME: Howard Harmon    MR#:  [id number]  DATE OF BIRTH:  11-20-1929  DATE OF ADMISSION:  10/12/2014  PRIMARY CARE PHYSICIAN: Rusty Aus., MD   REQUESTING/REFERRING PHYSICIAN: Dr, Edd Fabian.  CHIEF COMPLAINT:   Chief Complaint  Patient presents with  . Rectal Bleeding  . Weakness    HISTORY OF PRESENT ILLNESS:  Howard Harmon  is a 81 y.o. male with a known history of DVT, PE, CKD and hypertension. The patient has a history of DVT and PE for 10 years, has been on Coumadin. The patient has had the intermittent rectal bleeding 6 weeks ago, so the Coumadin was discontinued. However, patient does still have a intermittent rectal bleeding, which is fresh blood. The patient has a lot of fresh rectal bleeding multiple times for the past 2 days. He feels dizzy and weak. So he was sent to the ED for further evaluation. The patient hemoglobin is 9.5, but it was 11.3 at the end of April. The patient denies any other symptoms.  PAST MEDICAL HISTORY:   Past Medical History  Diagnosis Date  . Blood clotting tendency   . Melanoma   . Hypertension     PAST SURGICAL HISTORY:   Past Surgical History  Procedure Laterality Date  . Cholecystectomy      SOCIAL HISTORY:   History  Substance Use Topics  . Smoking status: Never Smoker   . Smokeless tobacco: Not on file  . Alcohol Use: No    FAMILY HISTORY:  History reviewed. No pertinent family history. the patient's sister has Crohn's disease.  DRUG ALLERGIES:  No Known Allergies  REVIEW OF SYSTEMS:  CONSTITUTIONAL: No fever, has a generalized weakness and dizziness.  EYES: No blurred or double vision.  EARS, NOSE, AND THROAT: No tinnitus or ear pain.  RESPIRATORY: No cough, shortness of breath, wheezing or hemoptysis.  CARDIOVASCULAR: No chest pain, orthopnea, edema.  GASTROINTESTINAL: No nausea, vomiting, diarrhea or abdominal pain.  GENITOURINARY: No dysuria,  hematuria.  ENDOCRINE: No polyuria, nocturia,  HEMATOLOGY: No anemia, easy bruising or bleeding SKIN: No rash or lesion. MUSCULOSKELETAL: No joint pain or arthritis.   NEUROLOGIC: No tingling, numbness, weakness.  PSYCHIATRY: No anxiety or depression.   MEDICATIONS AT HOME:   Prior to Admission medications   Medication Sig Start Date End Date Taking? Authorizing Provider  ALOE PO Take 90 mLs by mouth daily.   Yes Historical Provider, MD  amLODipine (NORVASC) 5 MG tablet Take 5 mg by mouth daily.   Yes Historical Provider, MD  Cholecalciferol (VITAMIN D3 PO) Take 1 tablet by mouth daily.   Yes Historical Provider, MD  Coenzyme Q10 (CO Q 10 PO) Take 1 capsule by mouth daily.   Yes Historical Provider, MD  Iron-Vitamin C 65-125 MG TABS Take 1 tablet by mouth 2 (two) times daily.   Yes Historical Provider, MD  RABEprazole (ACIPHEX) 20 MG tablet Take 20 mg by mouth 2 (two) times daily.   Yes Historical Provider, MD  traMADol (ULTRAM) 50 MG tablet Take 100 mg by mouth 3 (three) times daily as needed for moderate pain.   Yes Historical Provider, MD  vitamin B-12 (CYANOCOBALAMIN) 1000 MCG tablet Take 1,000 mcg by mouth daily.   Yes Historical Provider, MD      VITAL SIGNS:  Blood pressure 129/72, pulse 69, temperature 97.7 F (36.5 C), temperature source Oral, resp. rate 19, height 5\' 11"  (1.803 m), weight 84.55 kg (186 lb  6.4 oz), SpO2 100 %.  PHYSICAL EXAMINATION:  GENERAL:  81 y.o.-year-old patient lying in the bed with no acute distress.  EYES: Pupils equal, round, reactive to light and accommodation. No scleral icterus. Extraocular muscles intact.  HEENT: Head atraumatic, normocephalic. Oropharynx and nasopharynx clear.  NECK:  Supple, no jugular venous distention. No thyroid enlargement, no tenderness.  LUNGS: Normal breath sounds bilaterally, no wheezing, rales,rhonchi or crepitation. No use of accessory muscles of respiration.  CARDIOVASCULAR: S1, S2 normal. No murmurs, rubs, or  gallops.  ABDOMEN: Soft, nontender, nondistended. Bowel sounds present. No organomegaly or mass.  EXTREMITIES: No pedal edema, cyanosis, or clubbing.  NEUROLOGIC: Cranial nerves II through XII are intact. Muscle strength 5/5 in all extremities. Sensation intact. Gait not checked.  PSYCHIATRIC: The patient is alert and oriented x 3.  SKIN: No obvious rash, lesion, or ulcer.   LABORATORY PANEL:   CBC  Recent Labs Lab 10/12/14 1555  WBC 11.3*  HGB 9.5*  HCT 30.0*  PLT 176   ------------------------------------------------------------------------------------------------------------------  Chemistries   Recent Labs Lab 10/12/14 1555  NA 143  K 4.7  CL 110  CO2 24  GLUCOSE 133*  BUN 31*  CREATININE 1.66*  CALCIUM 9.1  AST 22  ALT 19  ALKPHOS 52  BILITOT 0.9   ------------------------------------------------------------------------------------------------------------------  Cardiac Enzymes  Recent Labs Lab 10/12/14 1829  TROPONINI 0.04*   ------------------------------------------------------------------------------------------------------------------  RADIOLOGY:  Dg Chest Portable 1 View  10/12/2014   CLINICAL DATA:  Rectal bleeding and generalized weakness  EXAM: PORTABLE CHEST - 1 VIEW  COMPARISON:  Portable exam 3546 hours compared to 07/24/2014  FINDINGS: LEFT subclavian transvenous pacemaker leads project over RIGHT atrium and RIGHT ventricle.  Upper normal heart size.  Tortuous aorta.  Mediastinal contours and pulmonary vascularity normal.  Minimal chronic RIGHT basilar atelectasis.  Calcified granuloma LEFT upper lobe.  No definite pulmonary infiltrate, pleural effusion, or pneumothorax.  Bones demineralized with RIGHT glenohumeral degenerative changes noted.  IMPRESSION: No acute abnormalities.  Minimal chronic RIGHT basilar atelectasis.   Electronically Signed   By: Lavonia Dana M.D.   On: 10/12/2014 16:45    EKG:  No orders found for this or any previous  visit.  IMPRESSION AND PLAN:   Rectal bleeding. I will keep patient on nothing by mouth with IV fluid to support, start Protonix IV twice a day, get GI consult and follow-up hemoglobin. The patient may need a PRBC transfusion if hemoglobin continues to drop. Anemia. Follow-up hemoglobin and PRBC transfusion when necessary. CKG stage III. Stable. History of a DVT and PE. The patient has been off Coumadin for 6 weeks. Hypertension. I will hold by mouth hypertension medication. The patient may need IV hydralazine when necessary.    All the records are reviewed and case discussed with ED provider. Management plans discussed with the patient, family and they are in agreement.  CODE STATUS: Full code  TOTAL TIME TAKING CARE OF THIS PATIENT: 68  minutes.    Demetrios Loll M.D on 10/12/2014 at 7:53 PM  Between 7am to 6pm - Pager - [phone]  After 6pm go to [email or web address] - password [email or web address]  [facility]  Office  [phone]  CC: Primary care physician; Rusty Aus., MD

## 2014-10-12 NOTE — ED Notes (Signed)
MD at bedside. 

## 2014-10-12 NOTE — ED Notes (Signed)
Pt c/o bright red blood X 2 days. Denies diarrhea, denies nausea and vomiting. Pt states that when he even sits down to urinate that bright red blood comes out of rectum. Weakness with walking only. Denies SOB, denies CP. Color WNL.

## 2014-10-12 NOTE — ED Provider Notes (Signed)
[facility] Emergency Department Provider Note  ____________________________________________  Time seen: Approximately 4:25 PM  I have reviewed the triage vital signs and the nursing notes.   HISTORY  Chief Complaint Rectal Bleeding and Weakness    HPI Howard Harmon is a 81 y.o. male with history of hypertension, chronic neuropathy, deep vein thrombosis and pulmonary embolism not currently anticoagulated, hyperlipidemia, tachycardia, s/p pacemaker, and chronic renal failure, history of prior GI bleed presents with 2 days of bright red blood per rectum. He feels generally weak. Onset gradual. Severity 7 out of 10. No modifying factors. No associated symptoms other than generalized weakness. No chest pain or difficulty breathing, no hematemesis, no abdominal pain. Was previously anticoagulated however this was discontinued approximately 6 weeks ago due to prior GI bleeding.   Past Medical History  Diagnosis Date  . Blood clotting tendency   . Melanoma   . Hypertension     Patient Active Problem List   Diagnosis Date Noted  . GIB (gastrointestinal bleeding) 10/12/2014  . Anemia 10/12/2014  . CKD (chronic kidney disease) stage 3, GFR 30-59 ml/min 10/12/2014    Past Surgical History  Procedure Laterality Date  . Cholecystectomy      Current Outpatient Rx  Name  Route  Sig  Dispense  Refill  . ALOE PO   Oral   Take 90 mLs by mouth daily.         Marland Kitchen amLODipine (NORVASC) 5 MG tablet   Oral   Take 5 mg by mouth daily.         . Cholecalciferol (VITAMIN D3 PO)   Oral   Take 1 tablet by mouth daily.         . Coenzyme Q10 (CO Q 10 PO)   Oral   Take 1 capsule by mouth daily.         . Iron-Vitamin C 65-125 MG TABS   Oral   Take 1 tablet by mouth 2 (two) times daily.         . RABEprazole (ACIPHEX) 20 MG tablet   Oral   Take 20 mg by mouth 2 (two) times daily.         . traMADol (ULTRAM) 50 MG tablet   Oral   Take 100 mg by  mouth 3 (three) times daily as needed for moderate pain.         . vitamin B-12 (CYANOCOBALAMIN) 1000 MCG tablet   Oral   Take 1,000 mcg by mouth daily.           Allergies Review of patient's allergies indicates no known allergies.  History reviewed. No pertinent family history.  Social History History  Substance Use Topics  . Smoking status: Never Smoker   . Smokeless tobacco: Not on file  . Alcohol Use: No    Review of Systems Constitutional: No fever/chills Eyes: No visual changes. ENT: No sore throat. Cardiovascular: Denies chest pain. Respiratory: Denies shortness of breath. Gastrointestinal: No abdominal pain.  No nausea, no vomiting.  No diarrhea.  No constipation. Genitourinary: Negative for dysuria. Musculoskeletal: Negative for back pain. Skin: Negative for rash. Neurological: Negative for headaches, focal weakness or numbness.  10-point ROS otherwise negative.  ____________________________________________   PHYSICAL EXAM:   Filed Vitals:   10/12/14 1555 10/12/14 1606 10/12/14 1630 10/12/14 1700  BP: 97/47 126/80 114/65 99/73  Pulse: 144  83 54  Temp: 98.1 F (36.7 C)     TempSrc: Oral     Resp: 20 17  23 18  Height: 5\' 11"  (1.803 m)     Weight: 188 lb (85.276 kg)     SpO2: 94%  98% 100%    VITAL SIGNS: ED Triage Vitals  Enc Vitals Group     BP 10/12/14 1555 97/47 mmHg     Pulse Rate 10/12/14 1555 144     Resp 10/12/14 1555 20     Temp 10/12/14 1555 98.1 F (36.7 C)     Temp Source 10/12/14 1555 Oral     SpO2 10/12/14 1555 94 %     Weight 10/12/14 1555 188 lb (85.276 kg)     Height 10/12/14 1555 5\' 11"  (1.803 m)     Head Cir --      Peak Flow --      Pain Score --      Pain Loc --      Pain Edu? --      Excl. in [place]? --     Constitutional: Alert and oriented. Well appearing and in no acute distress. Eyes: Conjunctivae are normal. PERRL. EOMI. Head: Atraumatic. Nose: No congestion/rhinnorhea. Mouth/Throat: Mucous membranes are  moist.  Oropharynx non-erythematous. Neck: No stridor.   Cardiovascular: Normal rate, regular rhythm. Grossly normal heart sounds.  Good peripheral circulation. Respiratory: Normal respiratory effort.  No retractions. Lungs CTAB. Gastrointestinal: Soft and nontender. No distention. No abdominal bruits. No CVA tenderness. Rectal: Brown stool in the rectal vault is strongly guaiac positive Genitourinary: Deferred Musculoskeletal: No lower extremity tenderness nor edema.  No joint effusions. Neurologic:  Normal speech and language. No gross focal neurologic deficits are appreciated. Speech is normal. No gait instability. Skin:  Skin is warm, dry and intact. No rash noted. Psychiatric: Mood and affect are normal. Speech and behavior are normal.  ____________________________________________   LABS (all labs ordered are listed, but only abnormal results are displayed)  Labs Reviewed  CBC - Abnormal; Notable for the following:    WBC 11.3 (*)    RBC 3.17 (*)    Hemoglobin 9.5 (*)    HCT 30.0 (*)    MCHC 31.8 (*)    RDW 17.2 (*)    All other components within normal limits  COMPREHENSIVE METABOLIC PANEL - Abnormal; Notable for the following:    Glucose, Bld 133 (*)    BUN 31 (*)    Creatinine, Ser 1.66 (*)    Total Protein 5.9 (*)    Albumin 3.4 (*)    GFR calc non Af Amer 37 (*)    GFR calc Af Amer 43 (*)    All other components within normal limits  PROTIME-INR  APTT  TROPONIN I  BASIC METABOLIC PANEL  CBC  TYPE AND SCREEN  ABO/RH   ____________________________________________  EKG  ED ECG REPORT   Date: 10/12/2014  EKG Time: 1603  Rate: 82  Rhythm: Demand pacemaker, right bundle branch block  Axis: Normal  Intervals:right bundle branch block  ST&T Change: No acute ST segment change  ____________________________________________  RADIOLOGY  CXR: IMPRESSION: No acute abnormalities.  Minimal chronic RIGHT basilar  atelectasis. ____________________________________________   PROCEDURES  Procedure(s) performed: None  Critical Care performed: No  ____________________________________________   INITIAL IMPRESSION / ASSESSMENT AND PLAN / ED COURSE  Pertinent labs & imaging results that were available during my care of the patient were reviewed by me and considered in my medical decision making (see chart for details).  Howard Harmon is a 81 y.o. male with history of hypertension, chronic neuropathy, deep vein thrombosis and  pulmonary embolism not currently anticoagulated, hyperlipidemia, tachycardia, s/p pacemaker, and chronic renal failure, history of prior GI bleed presents with 2 days of bright red blood per rectum.  ----------------------------------------- 5:46 PM on 10/12/2014 -----------------------------------------  Hemoglobin is 9.5 which is nearly a 1 point drop from 1 week ago. He does have guaiac positive stool in rectal vault, concerning for continued lower GI bleed. Hospitalist will admit. ____________________________________________   FINAL CLINICAL IMPRESSION(S) / ED DIAGNOSES  Final diagnoses:  Lower GI bleed      Joanne Gavel, MD 10/12/14 1747

## 2014-10-13 LAB — CBC
HCT: 28.8 % — ABNORMAL LOW (ref 40.0–52.0)
HEMOGLOBIN: 8.9 g/dL — AB (ref 13.0–18.0)
MCH: 29.6 pg (ref 26.0–34.0)
MCHC: 31 g/dL — AB (ref 32.0–36.0)
MCV: 95.6 fL (ref 80.0–100.0)
Platelets: 175 10*3/uL (ref 150–440)
RBC: 3.02 MIL/uL — ABNORMAL LOW (ref 4.40–5.90)
RDW: 16.8 % — ABNORMAL HIGH (ref 11.5–14.5)
WBC: 13.8 10*3/uL — AB (ref 3.8–10.6)

## 2014-10-13 LAB — BASIC METABOLIC PANEL
ANION GAP: 12 (ref 5–15)
BUN: 29 mg/dL — ABNORMAL HIGH (ref 6–20)
CO2: 20 mmol/L — ABNORMAL LOW (ref 22–32)
Calcium: 8.8 mg/dL — ABNORMAL LOW (ref 8.9–10.3)
Chloride: 111 mmol/L (ref 101–111)
Creatinine, Ser: 1.73 mg/dL — ABNORMAL HIGH (ref 0.61–1.24)
GFR calc Af Amer: 41 mL/min — ABNORMAL LOW (ref >60)
GFR, EST NON AFRICAN AMERICAN: 35 mL/min — AB (ref >60)
Glucose, Bld: 152 mg/dL — ABNORMAL HIGH (ref 65–99)
POTASSIUM: 4.4 mmol/L (ref 3.5–5.1)
SODIUM: 143 mmol/L (ref 135–145)

## 2014-10-13 MED ORDER — TRAMADOL HCL 50 MG PO TABS
50.0000 mg | ORAL_TABLET | Freq: Four times a day (QID) | ORAL | Status: DC | PRN
  Administered 2014-10-13 – 2014-10-15 (×4): 50 mg via ORAL
  Filled 2014-10-13 (×5): qty 1

## 2014-10-13 NOTE — Consult Note (Signed)
GI Inpatient Consult Note  Reason for Consult: rectal bleeding   Attending Requesting Consult:   Howard Harmon  History of Present Illness: Howard Harmon is a 81 y.o. male with PMhx notable for DVT and PE, now off anti-coag due to GI bleeding, p/w severeal days of rectal bleeding. Bright red, 5 times per day for 4 days. Less overnight but on darks stool this am.  Reports stopping anti-coag few months ago due to GI bleeding and tried starting asa one month ago but now stopped that also.  No abd pain. Does also report loose stools for 4 - 5 days.  Blood mostly on outside of stool. No NSAIDS.   Here in hosp, noted to have drop in Hgb to 9.    Last colonoscopy 5 years ago or so he thinks.   No fam hx GI malignancy.   Past Medical History:  Past Medical History  Diagnosis Date  . Blood clotting tendency   . Melanoma   . Hypertension     Problem List: Patient Active Problem List   Diagnosis Date Noted  . GIB (gastrointestinal bleeding) 10/12/2014  . Anemia 10/12/2014  . CKD (chronic kidney disease) stage 3, GFR 30-59 ml/min 10/12/2014    Past Surgical History: Past Surgical History  Procedure Laterality Date  . Cholecystectomy      Allergies: No Known Allergies  Home Medications: Prescriptions prior to admission  Medication Sig Dispense Refill Last Dose  . ALOE PO Take 90 mLs by mouth daily.   10/12/2014 at Unknown time  . amLODipine (NORVASC) 5 MG tablet Take 5 mg by mouth daily.   10/12/2014 at Unknown time  . Cholecalciferol (VITAMIN D3 PO) Take 1 tablet by mouth daily.   10/12/2014 at Unknown time  . Coenzyme Q10 (CO Q 10 PO) Take 1 capsule by mouth daily.   10/12/2014 at Unknown time  . Iron-Vitamin C 65-125 MG TABS Take 1 tablet by mouth 2 (two) times daily.   10/12/2014 at Unknown time  . RABEprazole (ACIPHEX) 20 MG tablet Take 20 mg by mouth 2 (two) times daily.   10/12/2014 at Unknown time  . traMADol (ULTRAM) 50 MG tablet Take 100 mg by mouth 3 (three) times daily as needed for  moderate pain.   10/12/2014 at 0700  . vitamin B-12 (CYANOCOBALAMIN) 1000 MCG tablet Take 1,000 mcg by mouth daily.   10/12/2014 at Unknown time   Home medication reconciliation was completed with the patient.   Scheduled Inpatient Medications:   . pantoprazole (PROTONIX) IV  40 mg Intravenous Q12H    Continuous Inpatient Infusions:     PRN Inpatient Medications:  acetaminophen **OR** acetaminophen, albuterol, ondansetron **OR** ondansetron (ZOFRAN) IV, traMADol  Family History:   The patient's family history is negative for inflammatory bowel disorders, GI malignancy, or solid organ transplantation.  Social History:   reports that he has never smoked. He does not have any smokeless tobacco history on file. He reports that he does not drink alcohol.   Review of Systems: Constitutional: Weight is stable.  Eyes: No changes in vision. ENT: No oral lesions, sore throat.  GI: see HPI.  Heme/Lymph: No easy bruising.  CV: No chest pain.  GU: No hematuria.  Integumentary: No rashes.  Neuro: No headaches.  Psych: No depression/anxiety.  Endocrine: No heat/cold intolerance.  Allergic/Immunologic: No urticaria.  Resp: No cough, SOB.  Musculoskeletal: No joint swelling.    Physical Examination: BP 102/66 mmHg  Pulse 92  Temp(Src) 98 F (36.7 C) (Oral)  Resp 18  Ht 5\' 11"  (1.803 m)  Wt 186 lb 6.4 oz (84.55 kg)  BMI 26.01 kg/m2  SpO2 94% Gen: NAD, alert and oriented x 4 HEENT: PEERLA, EOMI, Neck: supple, no JVD or thyromegaly Chest: CTA bilaterally, no wheezes, crackles, or other adventitious sounds CV: RRR, no m/g/c/r Abd: soft, NT, ND, +BS in all four quadrants; no HSM, guarding, ridigity, or rebound tenderness Ext: no edema, well perfused with 2+ pulses, Skin: no rash or lesions noted Lymph: no LAD  Data: Lab Results  Component Value Date   WBC 13.8* 10/13/2014   HGB 8.9* 10/13/2014   HCT 28.8* 10/13/2014   MCV 95.6 10/13/2014   PLT 175 10/13/2014    Recent  Labs Lab 10/12/14 1555 10/12/14 2043 10/13/14 0504  HGB 9.5* 9.0* 8.9*   Lab Results  Component Value Date   NA 143 10/13/2014   K 4.4 10/13/2014   CL 111 10/13/2014   CO2 20* 10/13/2014   BUN 29* 10/13/2014   CREATININE 1.73* 10/13/2014   Lab Results  Component Value Date   ALT 19 10/12/2014   AST 22 10/12/2014   ALKPHOS 52 10/12/2014   BILITOT 0.9 10/12/2014    Recent Labs Lab 10/12/14 1555 10/12/14 1829  APTT  --  25  INR 1.12  --    Assessment/Plan: Howard Harmon is a 81 y.o. male 4 days of rectal bleeding.   Has hx of DVT and PE and should be on anti-caog but off because of severe LGI bleeds. NO colonoscopy in past several years.  Hgb down to 9.   Suspect the bleeding has resolved, that dark stool today was old blood.   Recommendations: - clear liquids - colon on Thursday - EGD also on Thur since needs to go back on anti-coag, r/o upper source given one dark stool.  - follow Hgb until stable.  - bleeding scan for further significan bleeding.  - consider hematology consult as outpt for hypercoag w/u ( says he has not seen hematology)   Thank you for the consult. Please call with questions or concerns.  Howard, Grace Blight, MD

## 2014-10-13 NOTE — Progress Notes (Signed)
Initial Nutrition Assessment   INTERVENTION:  Medical Food Supplement Therapy: Will recommend supplement on follow once diet able to be progressed  NUTRITION DIAGNOSIS:  Inadequate oral intake related to inability to eat as evidenced by NPO status.  GOAL:  Goal for diet progression as medically able within 5-7 days  MONITOR: Energy Intake Electrolyte and renal Profile Digestive system Anthropometrics  REASON FOR ASSESSMENT:  Malnutrition Screening Tool   ASSESSMENT:  Pt admitted with 4-5 weeks GI bleed, noted bloody stools documented yesterday; GI consulted. PMHx: HTN, melanoma, blood clotting tendency  PO Intake: Pt NPO currently. Pt reports since gall bladder removal 2-3 years ago, pt having taste aversions to food and has been eating less, reported 50-75% less than before. Pt reports drinking 1 Boost per day, but stopping when bleeding started as pt worried that was what may have contributed to bleeding.  Medications: Protonix Labs: Electrolyte and Renal Profile:    Recent Labs Lab 10/12/14 1555 10/13/14 0504  BUN 31* 29*  CREATININE 1.66* 1.73*  NA 143 143  K 4.7 4.4   Glucose Profile: No results for input(s): GLUCAP in the last 72 hours. Protein Profile:  Recent Labs Lab 10/12/14 1555  ALBUMIN 3.4*   Anemia Profile: Hgb 8.9  Height:  Ht Readings from Last 1 Encounters:  10/12/14 5\' 11"  (1.803 m)   Pt reports weight of 230lbs 3 years ago. Pt reports recent weight of 188lbs at MD office outpatient last week.  Weight:  Wt Readings from Last 1 Encounters:  10/12/14 186 lb 6.4 oz (84.55 kg)    Ideal Body Weight:     Wt Readings from Last 10 Encounters:  10/12/14 186 lb 6.4 oz (84.55 kg)    BMI:  Body mass index is 26.01 kg/(m^2).   Nutrition-Focused physical exam completed. Findings are WDL for fat depletion, muscle depletion, and edema.   Estimated Nutritional Needs:  Kcal:  2063-2439kcals, BEE: 1563kcals, (IF 1.1-1.3)(AF 1.2)    Protein:  85-102g/kg (1.0-1.2g/kg)  Fluid:  2115-2592mL of fluid (25-60mL/kg)  Diet Order:  Diet NPO time specified Except for: Sips with Meds  EDUCATION NEEDS:  No education needs identified at this time   Intake/Output Summary (Last 24 hours) at 10/13/14 1107 Last data filed at 10/13/14 0757  Gross per 24 hour  Intake      0 ml  Output      0 ml  Net      0 ml    Last BM:  5/9 bloody  MODERATE Care Level  Dwyane Luo, RD, LDN Pager [phone]

## 2014-10-13 NOTE — Progress Notes (Signed)
Clear liquid per Dr. Rayann Heman

## 2014-10-13 NOTE — Progress Notes (Signed)
Howard Harmon is a 81 y.o. male   SUBJECTIVE:  Patient with 4-5 weeks of GI bleeding, stopped Coumadin one month ago and recently put back on aspirin as he had not been bleeding however the bleeding resumed and continues this morning. Hemoglobin has dropped from 12 down to 8.9 over the last month, hemoglobin of 10.5 one week ago. No abdominal pain, no nausea, no nonsteroidal use.  ______________________________________________________________________  ROS: Review of systems is unremarkable for any active cardiac,respiratory, GI, GU, hematologic, neurologic or psychiatric systems, 10 systems reviewed.  . pantoprazole (PROTONIX) IV  40 mg Intravenous Q12H   acetaminophen **OR** acetaminophen, albuterol, ondansetron **OR** ondansetron (ZOFRAN) IV   Past Medical History  Diagnosis Date  . Blood clotting tendency   . Melanoma   . Hypertension     Past Surgical History  Procedure Laterality Date  . Cholecystectomy      PHYSICAL EXAM:  BP 123/54 mmHg  Pulse 63  Temp(Src) 98.7 F (37.1 C) (Oral)  Resp 18  Ht 5\' 11"  (1.803 m)  Wt 84.55 kg (186 lb 6.4 oz)  BMI 26.01 kg/m2  SpO2 97%  Wt Readings from Last 3 Encounters:  10/12/14 84.55 kg (186 lb 6.4 oz)           BP Readings from Last 3 Encounters:  10/12/14 123/54    Constitutional: NAD Neck: supple, no thyromegaly Respiratory: CTA, no rales or wheezes Cardiovascular: RRR, no murmur, no gallop Abdomen: soft, good BS, nontender Extremities: no edema Neuro: alert and oriented, no focal motor or sensory deficits  ASSESSMENT/PLAN:  Labs and imaging studies were reviewed  GI bleeding-GI consult, IV Protonix, off aspirin and Coumadin History DVT/PE-off Coumadin Osteoarthritis-tramadol when necessary CKD-baseline creatinine 1.6

## 2014-10-13 NOTE — Progress Notes (Signed)
Spoke with patient and daughter Howard Harmon concerning discharge planning during morning rounds. Patient lives at home with spouse who drives. Patient states that he has 4 point cane and Rolator walker at home and that both he and his spouse are on home O2. Patient was attending outpatient PT at [facility]. No CM needs anticipated at this time will continue to follow.

## 2014-10-13 NOTE — Progress Notes (Signed)
Discontinue order for Pulse Ox per Dr. Sabra Heck

## 2014-10-14 LAB — CBC WITH DIFFERENTIAL/PLATELET
Basophils Absolute: 0 10*3/uL (ref 0–0.1)
Basophils Relative: 0 %
EOS PCT: 1 %
Eosinophils Absolute: 0.1 10*3/uL (ref 0–0.7)
HCT: 26.2 % — ABNORMAL LOW (ref 40.0–52.0)
Hemoglobin: 8.5 g/dL — ABNORMAL LOW (ref 13.0–18.0)
LYMPHS ABS: 1.6 10*3/uL (ref 1.0–3.6)
Lymphocytes Relative: 18 %
MCH: 30.7 pg (ref 26.0–34.0)
MCHC: 32.4 g/dL (ref 32.0–36.0)
MCV: 94.6 fL (ref 80.0–100.0)
Monocytes Absolute: 1 10*3/uL (ref 0.2–1.0)
Monocytes Relative: 11 %
Neutro Abs: 6.4 10*3/uL (ref 1.4–6.5)
Neutrophils Relative %: 70 %
Platelets: 151 10*3/uL (ref 150–440)
RBC: 2.76 MIL/uL — AB (ref 4.40–5.90)
RDW: 16.8 % — ABNORMAL HIGH (ref 11.5–14.5)
WBC: 9.1 10*3/uL (ref 3.8–10.6)

## 2014-10-14 LAB — BASIC METABOLIC PANEL
Anion gap: 7 (ref 5–15)
BUN: 23 mg/dL — ABNORMAL HIGH (ref 6–20)
CO2: 26 mmol/L (ref 22–32)
CREATININE: 1.45 mg/dL — AB (ref 0.61–1.24)
Calcium: 8.6 mg/dL — ABNORMAL LOW (ref 8.9–10.3)
Chloride: 109 mmol/L (ref 101–111)
GFR, EST AFRICAN AMERICAN: 50 mL/min — AB (ref >60)
GFR, EST NON AFRICAN AMERICAN: 43 mL/min — AB (ref >60)
Glucose, Bld: 108 mg/dL — ABNORMAL HIGH (ref 65–99)
Potassium: 3.8 mmol/L (ref 3.5–5.1)
Sodium: 142 mmol/L (ref 135–145)

## 2014-10-14 MED ORDER — POLYETHYLENE GLYCOL 3350 17 GM/SCOOP PO POWD
1.0000 | Freq: Once | ORAL | Status: AC
  Administered 2014-10-14: 255 g via ORAL
  Filled 2014-10-14: qty 255

## 2014-10-14 NOTE — Progress Notes (Signed)
Patient ID: Howard Harmon, male   DOB: September 30, 1929, 81 y.o.   MRN: [id number] Howard Harmon is a 81 y.o. male   SUBJECTIVE:  Patient with 4-5 weeks of GI bleeding, stopped Coumadin one month ago and recently put back on aspirin as he had not been bleeding however the bleeding resumed and continues this morning. Hemoglobin has dropped from 12 down to 8.9 over the last month, hemoglobin of 10.5 one week ago. No abdominal pain, no nausea, no nonsteroidal use. No bleeding overnight, generally weak ______________________________________________________________________  ROS: Review of systems is unremarkable for any active cardiac,respiratory, GI, GU, hematologic, neurologic or psychiatric systems, 10 systems reviewed.  . pantoprazole (PROTONIX) IV  40 mg Intravenous Q12H   acetaminophen **OR** acetaminophen, albuterol, ondansetron **OR** ondansetron (ZOFRAN) IV, traMADol   Past Medical History  Diagnosis Date  . Blood clotting tendency   . Melanoma   . Hypertension     Past Surgical History  Procedure Laterality Date  . Cholecystectomy      PHYSICAL EXAM:  BP 111/59 mmHg  Pulse 59  Temp(Src) 98.7 F (37.1 C) (Oral)  Resp 14  Ht 5\' 11"  (1.803 m)  Wt 84.55 kg (186 lb 6.4 oz)  BMI 26.01 kg/m2  SpO2 100%  Wt Readings from Last 3 Encounters:  10/12/14 84.55 kg (186 lb 6.4 oz)           BP Readings from Last 3 Encounters:  10/13/14 111/59    Constitutional: NAD Neck: supple, no thyromegaly Respiratory: CTA, no rales or wheezes Cardiovascular: RRR, no murmur, no gallop Abdomen: soft, good BS, nontender Extremities: no edema Neuro: alert and oriented, no focal motor or sensory deficits  ASSESSMENT/PLAN:  Labs and imaging studies were reviewed  GI bleeding-GI consult, IV Protonix, off aspirin and Coumadin, no recurrent bleeding, endoscopy tomorrow History DVT/PE-off Coumadin, consider aspirin alone post procedure Osteoarthritis-tramadol when necessary CKD-baseline  creatinine 1.6

## 2014-10-14 NOTE — Progress Notes (Signed)
GI Inpatient Follow-up Note  Patient Identification: Howard Harmon is a 81 y.o. male  Subjective:  Scheduled Inpatient Medications:  . pantoprazole (PROTONIX) IV  40 mg Intravenous Q12H  . polyethylene glycol powder  1 Container Oral Once    Continuous Inpatient Infusions:     PRN Inpatient Medications:  acetaminophen **OR** acetaminophen, albuterol, ondansetron **OR** ondansetron (ZOFRAN) IV, traMADol  Review of Systems: Constitutional: Weight is stable.  Eyes: No changes in vision. ENT: No oral lesions, sore throat.  GI: see HPI.  Heme/Lymph: No easy bruising.  CV: No chest pain.  GU: No hematuria.  Integumentary: No rashes.  Neuro: No headaches.  Psych: No depression/anxiety.  Endocrine: No heat/cold intolerance.  Allergic/Immunologic: No urticaria.  Resp: No cough, SOB.  Musculoskeletal: No joint swelling.    Physical Examination: BP 125/62 mmHg  Pulse 61  Temp(Src) 98.2 F (36.8 C) (Oral)  Resp 19  Ht 5\' 11"  (1.803 m)  Wt 186 lb 6.4 oz (84.55 kg)  BMI 26.01 kg/m2  SpO2 100% Gen: NAD, alert and oriented x 4 HEENT: PEERLA, EOMI, Neck: supple, no JVD or thyromegaly Chest: CTA bilaterally, no wheezes, crackles, or other adventitious sounds CV: RRR, no m/g/c/r Abd: soft, NT, ND, +BS in all four quadrants; no HSM, guarding, ridigity, or rebound tenderness Ext: no edema, well perfused with 2+ pulses, Skin: no rash or lesions noted Lymph: no LAD  Data: Lab Results  Component Value Date   WBC 9.1 10/14/2014   HGB 8.5* 10/14/2014   HCT 26.2* 10/14/2014   MCV 94.6 10/14/2014   PLT 151 10/14/2014    Recent Labs Lab 10/12/14 2043 10/13/14 0504 10/14/14 0709  HGB 9.0* 8.9* 8.5*   Lab Results  Component Value Date   NA 142 10/14/2014   K 3.8 10/14/2014   CL 109 10/14/2014   CO2 26 10/14/2014   BUN 23* 10/14/2014   CREATININE 1.45* 10/14/2014   Lab Results  Component Value Date   ALT 19 10/12/2014   AST 22 10/12/2014   ALKPHOS 52 10/12/2014    BILITOT 0.9 10/12/2014    Recent Labs Lab 10/12/14 1555 10/12/14 1829  APTT  --  25  INR 1.12  --    Assessment/Plan: Howard Harmon is a 81 y.o. male with rectal bleeding.  Now resolved. Hgb stable.   Recommendations:  - colonoscopy in am - miralax prep.  - npo mn.   Please call with questions or concerns.  REIN, Grace Blight, MD

## 2014-10-14 NOTE — Plan of Care (Signed)
Problem: Phase I Progression Outcomes Goal: OOB as tolerated unless otherwise ordered Outcome: Progressing Pt OOB to commode with 1 person assistance.  Spoke with patients daughter who asked that staff ambulate with patient at least daily so he does not become deconditioned while in hospital. RN offered and encouraged patient to ambulate and the patient refused to ambulate at this time.  Will continue to offer assistance as needed.  Will monitor.    Problem: Phase II Progression Outcomes Goal: No active bleeding Outcome: Progressing Patient had soft, brown BM today.  No signs/symptoms of acitive bleeding noted.   Goal: Tolerating diet Outcome: Progressing Pt tolerating clear liquid diet.  No N/V noted.  Pt voices desire to eat "real food"

## 2014-10-15 ENCOUNTER — Inpatient Hospital Stay: Payer: Medicare Other | Admitting: Anesthesiology

## 2014-10-15 ENCOUNTER — Encounter
Admission: EM | Disposition: A | Discharge status: Home / Self Care | Payer: Self-pay | Source: Home / Self Care | Attending: Internal Medicine

## 2014-10-15 HISTORY — PX: COLONOSCOPY WITH PROPOFOL: SHX5780

## 2014-10-15 LAB — CBC WITH DIFFERENTIAL/PLATELET
BASOS ABS: 0 10*3/uL (ref 0–0.1)
Basophils Relative: 0 %
EOS ABS: 0.1 10*3/uL (ref 0–0.7)
EOS PCT: 1 %
HCT: 25.5 % — ABNORMAL LOW (ref 40.0–52.0)
Hemoglobin: 8.4 g/dL — ABNORMAL LOW (ref 13.0–18.0)
Lymphocytes Relative: 14 %
Lymphs Abs: 1.3 10*3/uL (ref 1.0–3.6)
MCH: 31 pg (ref 26.0–34.0)
MCHC: 32.9 g/dL (ref 32.0–36.0)
MCV: 94.3 fL (ref 80.0–100.0)
Monocytes Absolute: 1.1 10*3/uL — ABNORMAL HIGH (ref 0.2–1.0)
Monocytes Relative: 11 %
NEUTROS ABS: 7 10*3/uL — AB (ref 1.4–6.5)
NEUTROS PCT: 74 %
PLATELETS: 147 10*3/uL — AB (ref 150–440)
RBC: 2.7 MIL/uL — ABNORMAL LOW (ref 4.40–5.90)
RDW: 16.9 % — AB (ref 11.5–14.5)
WBC: 9.5 10*3/uL (ref 3.8–10.6)

## 2014-10-15 LAB — BASIC METABOLIC PANEL
ANION GAP: 7 (ref 5–15)
BUN: 18 mg/dL (ref 6–20)
CHLORIDE: 107 mmol/L (ref 101–111)
CO2: 26 mmol/L (ref 22–32)
CREATININE: 1.49 mg/dL — AB (ref 0.61–1.24)
Calcium: 8.7 mg/dL — ABNORMAL LOW (ref 8.9–10.3)
GFR, EST AFRICAN AMERICAN: 49 mL/min — AB (ref >60)
GFR, EST NON AFRICAN AMERICAN: 42 mL/min — AB (ref >60)
Glucose, Bld: 109 mg/dL — ABNORMAL HIGH (ref 65–99)
Potassium: 3.9 mmol/L (ref 3.5–5.1)
Sodium: 140 mmol/L (ref 135–145)

## 2014-10-15 SURGERY — COLONOSCOPY WITH PROPOFOL
Anesthesia: General

## 2014-10-15 MED ORDER — LIDOCAINE HCL (CARDIAC) 20 MG/ML IV SOLN
INTRAVENOUS | Status: DC | PRN
  Administered 2014-10-15: 50 mg via INTRAVENOUS

## 2014-10-15 MED ORDER — FENTANYL CITRATE (PF) 100 MCG/2ML IJ SOLN
INTRAMUSCULAR | Status: DC | PRN
  Administered 2014-10-15: 50 ug via INTRAVENOUS

## 2014-10-15 MED ORDER — SODIUM CHLORIDE 0.9 % IV SOLN
INTRAVENOUS | Status: DC
  Administered 2014-10-15: 1000 mL via INTRAVENOUS
  Administered 2014-10-15: 11:00:00 via INTRAVENOUS

## 2014-10-15 MED ORDER — PROPOFOL INFUSION 10 MG/ML OPTIME
INTRAVENOUS | Status: DC | PRN
Start: 2014-10-15 — End: 2014-10-15
  Administered 2014-10-15: 75 ug/kg/min via INTRAVENOUS

## 2014-10-15 MED ORDER — PROPOFOL 10 MG/ML IV BOLUS
INTRAVENOUS | Status: DC | PRN
  Administered 2014-10-15: 75 mg via INTRAVENOUS

## 2014-10-15 MED ORDER — EPHEDRINE SULFATE 50 MG/ML IJ SOLN
INTRAMUSCULAR | Status: DC | PRN
  Administered 2014-10-15: 10 mg via INTRAVENOUS

## 2014-10-15 MED ORDER — PHENYLEPHRINE HCL 10 MG/ML IJ SOLN
INTRAMUSCULAR | Status: DC | PRN
  Administered 2014-10-15: 100 ug via INTRAVENOUS

## 2014-10-15 NOTE — Interval H&P Note (Signed)
History and Physical Interval Note:  10/15/2014 10:19 AM  Howard Harmon  has presented today for surgery, with the diagnosis of lower GI bleed  The various methods of treatment have been discussed with the patient and family. After consideration of risks, benefits and other options for treatment, the patient has consented to  Procedure(s): COLONOSCOPY WITH PROPOFOL (N/A) as a surgical intervention .  The patient's history has been reviewed, patient examined, no change in status, stable for surgery.  I have reviewed the patient's chart and labs.  Questions were answered to the patient's satisfaction.     REIN, MATTHEW GORDON

## 2014-10-15 NOTE — Anesthesia Postprocedure Evaluation (Signed)
  Anesthesia Post-op Note  Patient: Howard Harmon  Procedure(s) Performed: Procedure(s): COLONOSCOPY WITH PROPOFOL (N/A)  Anesthesia type:General  Patient location: PACU  Post pain: Pain level controlled  Post assessment: Post-op Vital signs reviewed, Patient's Cardiovascular Status Stable, Respiratory Function Stable, Patent Airway and No signs of Nausea or vomiting  Post vital signs: Reviewed and stable  Last Vitals:  Filed Vitals:   10/15/14 1209  BP: 121/65  Pulse: 55  Temp: 36.4 C  Resp: 18    Level of consciousness: awake, alert  and patient cooperative  Complications: No apparent anesthesia complications

## 2014-10-15 NOTE — Progress Notes (Signed)
GI Note:  Colon today:   Several polyps, one requiring light cautery.  Few diverticulae R colon.  Mild int hemorrhoids.  Not clear if the bleeding source was the tics or hemorrhoids or neither.   Recs: - ok to restart ASA now - follow up with hematology quickly regarding re-starting anti-coag and whether appropriate anti-coag workup was consult.  - if further bleeding or drop in Hgb, would require EGD and capsule endo - f/u GI clinic with me in next 1 - 2 weeks.

## 2014-10-15 NOTE — Discharge Instructions (Signed)
Hold aspirin for now until appointment

## 2014-10-15 NOTE — Transfer of Care (Signed)
Immediate Anesthesia Transfer of Care Note  Patient: Howard Harmon  Procedure(s) Performed: Procedure(s): COLONOSCOPY WITH PROPOFOL (N/A)  Patient Location: PACU/endo  Anesthesia Type:General  Level of Consciousness: Alert, Awake, Oriented  Airway & Oxygen Therapy: Patient Spontanous Breathing  Post-op Assessment: Report given to RN  Post vital signs: Reviewed and stable  Last Vitals:  Filed Vitals:   10/15/14 1107  BP: 97/55  Pulse: 76  Temp: 36.4 C  Resp: 11    Complications: No apparent anesthesia complications

## 2014-10-15 NOTE — Op Note (Signed)
[facility] Gastroenterology Patient Name: Howard Harmon Procedure Date: 10/15/2014 10:14 AM MRN: [id number] Account #: [id number] Date of Birth: 01-Aug-1929 Admit Type: Inpatient Age: 81 Room: [facility] ENDO ROOM 1 Gender: Male Note Status: Finalized Procedure:         Colonoscopy Indications:       Hematochezia, Acute post hemorrhagic anemia Patient Profile:   This is an 81 year old male. Providers:         Gerrit Heck. Rayann Heman, MD Referring MD:      Rusty Aus, MD (Referring MD) Medicines:         Propofol per Anesthesia Complications:     No immediate complications. Procedure:         Pre-Anesthesia Assessment:                    - Prior to the procedure, a History and Physical was                     performed, and patient medications and allergies were                     reviewed. The patient is competent. The risks and benefits                     of the procedure and the sedation options and risks were                     discussed with the patient. All questions were answered                     and informed consent was obtained. Patient identification                     and proposed procedure were verified by the physician and                     the nurse in the pre-procedure area. Mental Status                     Examination: alert and oriented. Airway Examination:                     normal oropharyngeal airway and neck mobility. Respiratory                     Examination: clear to auscultation. CV Examination: RRR,                     no murmurs, no S3 or S4. Prophylactic Antibiotics: The                     patient does not require prophylactic antibiotics. Prior                     Anticoagulants: The patient has taken no previous                     anticoagulant or antiplatelet agents. ASA Grade                     Assessment: II - A patient with mild systemic disease.                     After reviewing the risks and benefits, the  patient was                deemed in satisfactory condition to undergo the procedure.                     The anesthesia plan was to use monitored anesthesia care                     (MAC). Immediately prior to administration of medications,                     the patient was re-assessed for adequacy to receive                     sedatives. The heart rate, respiratory rate, oxygen                     saturations, blood pressure, adequacy of pulmonary                     ventilation, and response to care were monitored                     throughout the procedure. The physical status of the                     patient was re-assessed after the procedure.                    - Prior to the procedure, a History and Physical was                     performed, and patient medications, allergies and                     sensitivities were reviewed. The patient's tolerance of                     previous anesthesia was reviewed.                    After obtaining informed consent, the colonoscope was                     passed under direct vision. Throughout the procedure, the                     patient's blood pressure, pulse, and oxygen saturations                     were monitored continuously. The Colonoscope was                     introduced through the anus and advanced to the the cecum,                     identified by appendiceal orifice and ileocecal valve. The                     colonoscopy was performed without difficulty. The patient                     tolerated the procedure well. The quality of the bowel                     preparation was excellent. Findings:      The  perianal exam findings include non-thrombosed external hemorrhoids.      A few small-mouthed diverticula were found in the ascending colon.      A 4 mm polyp was found in the proximal transverse colon. The polyp was       sessile. The polyp was removed with a cold snare. Resection and       retrieval were complete.      Two  sessile polyps were found in the distal transverse colon. The polyps       were 3 to 4 mm in size. These polyps were removed with a cold snare.       Resection and retrieval were complete.      A 5 mm polyp was found in the rectum. The polyp was sessile. The polyp       was removed with a hot snare. Resection and retrieval were complete.      Internal hemorrhoids were found during retroflexion. The hemorrhoids       were Grade I (internal hemorrhoids that do not prolapse).      The exam was otherwise without abnormality. Impression:        - Non-thrombosed external hemorrhoids found on perianal                     exam.                    - Diverticulosis in the ascending colon.                    - One 4 mm polyp in the proximal transverse colon.                     Resected and retrieved.                    - Two 3 to 4 mm polyps in the distal transverse colon.                     Resected and retrieved.                    - One 5 mm polyp in the rectum. Resected and retrieved.                    - Internal hemorrhoids.                    - The examination was otherwise normal. Recommendation:    - Observe patient in GI recovery unit.                    - Resume regular diet.                    - Continue present medications.                    - Await pathology results.                    - Safe for discharge from GI bleed standpoint                    - Perform upper endoscopy if further bleeding since                     diverticulosis was very mild.                    -  The findings and recommendations were discussed with the                     patient.                    - The findings and recommendations were discussed with the                     patient's family. Procedure Code(s): --- Professional ---                    [phone], Colonoscopy, flexible; with removal of tumor(s),                     polyp(s), or other lesion(s) by snare technique CPT copyright 2014 American  Medical Association. All rights reserved. The codes documented in this report are preliminary and upon coder review may  be revised to meet current compliance requirements. Mellody Life, MD 10/15/2014 11:02:53 AM This report has been signed electronically. Number of Addenda: 0 Note Initiated On: 10/15/2014 10:14 AM Scope Withdrawal Time: 0 hours 13 minutes 25 seconds  Total Procedure Duration: 0 hours 22 minutes 44 seconds       [facility]

## 2014-10-15 NOTE — Consult Note (Signed)
Patient will be discharged to home today no CM needs identified.

## 2014-10-15 NOTE — H&P (View-Only) (Signed)
GI Inpatient Consult Note  Reason for Consult: rectal bleeding   Attending Requesting Consult:   Howard Harmon  History of Present Illness: Howard Harmon is a 81 y.o. male with PMhx notable for DVT and PE, now off anti-coag due to GI bleeding, p/w severeal days of rectal bleeding. Bright red, 5 times per day for 4 days. Less overnight but on darks stool this am.  Reports stopping anti-coag few months ago due to GI bleeding and tried starting asa one month ago but now stopped that also.  No abd pain. Does also report loose stools for 4 - 5 days.  Blood mostly on outside of stool. No NSAIDS.   Here in hosp, noted to have drop in Hgb to 9.    Last colonoscopy 5 years ago or so he thinks.   No fam hx GI malignancy.   Past Medical History:  Past Medical History  Diagnosis Date  . Blood clotting tendency   . Melanoma   . Hypertension     Problem List: Patient Active Problem List   Diagnosis Date Noted  . GIB (gastrointestinal bleeding) 10/12/2014  . Anemia 10/12/2014  . CKD (chronic kidney disease) stage 3, GFR 30-59 ml/min 10/12/2014    Past Surgical History: Past Surgical History  Procedure Laterality Date  . Cholecystectomy      Allergies: No Known Allergies  Home Medications: Prescriptions prior to admission  Medication Sig Dispense Refill Last Dose  . ALOE PO Take 90 mLs by mouth daily.   10/12/2014 at Unknown time  . amLODipine (NORVASC) 5 MG tablet Take 5 mg by mouth daily.   10/12/2014 at Unknown time  . Cholecalciferol (VITAMIN D3 PO) Take 1 tablet by mouth daily.   10/12/2014 at Unknown time  . Coenzyme Q10 (CO Q 10 PO) Take 1 capsule by mouth daily.   10/12/2014 at Unknown time  . Iron-Vitamin C 65-125 MG TABS Take 1 tablet by mouth 2 (two) times daily.   10/12/2014 at Unknown time  . RABEprazole (ACIPHEX) 20 MG tablet Take 20 mg by mouth 2 (two) times daily.   10/12/2014 at Unknown time  . traMADol (ULTRAM) 50 MG tablet Take 100 mg by mouth 3 (three) times daily as needed for  moderate pain.   10/12/2014 at 0700  . vitamin B-12 (CYANOCOBALAMIN) 1000 MCG tablet Take 1,000 mcg by mouth daily.   10/12/2014 at Unknown time   Home medication reconciliation was completed with the patient.   Scheduled Inpatient Medications:   . pantoprazole (PROTONIX) IV  40 mg Intravenous Q12H    Continuous Inpatient Infusions:     PRN Inpatient Medications:  acetaminophen **OR** acetaminophen, albuterol, ondansetron **OR** ondansetron (ZOFRAN) IV, traMADol  Family History:   The patient's family history is negative for inflammatory bowel disorders, GI malignancy, or solid organ transplantation.  Social History:   reports that he has never smoked. He does not have any smokeless tobacco history on file. He reports that he does not drink alcohol.   Review of Systems: Constitutional: Weight is stable.  Eyes: No changes in vision. ENT: No oral lesions, sore throat.  GI: see HPI.  Heme/Lymph: No easy bruising.  CV: No chest pain.  GU: No hematuria.  Integumentary: No rashes.  Neuro: No headaches.  Psych: No depression/anxiety.  Endocrine: No heat/cold intolerance.  Allergic/Immunologic: No urticaria.  Resp: No cough, SOB.  Musculoskeletal: No joint swelling.    Physical Examination: BP 102/66 mmHg  Pulse 92  Temp(Src) 98 F (36.7 C) (Oral)  Resp 18  Ht 5\' 11"  (1.803 m)  Wt 186 lb 6.4 oz (84.55 kg)  BMI 26.01 kg/m2  SpO2 94% Gen: NAD, alert and oriented x 4 HEENT: PEERLA, EOMI, Neck: supple, no JVD or thyromegaly Chest: CTA bilaterally, no wheezes, crackles, or other adventitious sounds CV: RRR, no m/g/c/r Abd: soft, NT, ND, +BS in all four quadrants; no HSM, guarding, ridigity, or rebound tenderness Ext: no edema, well perfused with 2+ pulses, Skin: no rash or lesions noted Lymph: no LAD  Data: Lab Results  Component Value Date   WBC 13.8* 10/13/2014   HGB 8.9* 10/13/2014   HCT 28.8* 10/13/2014   MCV 95.6 10/13/2014   PLT 175 10/13/2014    Recent  Labs Lab 10/12/14 1555 10/12/14 2043 10/13/14 0504  HGB 9.5* 9.0* 8.9*   Lab Results  Component Value Date   NA 143 10/13/2014   K 4.4 10/13/2014   CL 111 10/13/2014   CO2 20* 10/13/2014   BUN 29* 10/13/2014   CREATININE 1.73* 10/13/2014   Lab Results  Component Value Date   ALT 19 10/12/2014   AST 22 10/12/2014   ALKPHOS 52 10/12/2014   BILITOT 0.9 10/12/2014    Recent Labs Lab 10/12/14 1555 10/12/14 1829  APTT  --  25  INR 1.12  --    Assessment/Plan: Howard Harmon is a 81 y.o. male 4 days of rectal bleeding.   Has hx of DVT and PE and should be on anti-caog but off because of severe LGI bleeds. NO colonoscopy in past several years.  Hgb down to 9.   Suspect the bleeding has resolved, that dark stool today was old blood.   Recommendations: - clear liquids - colon on Thursday - EGD also on Thur since needs to go back on anti-coag, r/o upper source given one dark stool.  - follow Hgb until stable.  - bleeding scan for further significan bleeding.  - consider hematology consult as outpt for hypercoag w/u ( says he has not seen hematology)   Thank you for the consult. Please call with questions or concerns.  REIN, Grace Blight, MD

## 2014-10-15 NOTE — Progress Notes (Signed)
Patient ID: [place], male   DOB: 26-Sep-1929, 81 y.o.   MRN: [id number] Patient ID: [place], male   DOB: 1930-03-02, 81 y.o.   MRN: [id number] [place] is a 81 y.o. male   SUBJECTIVE:  Patient with 4-5 weeks of GI bleeding, stopped Coumadin one month ago and recently put back on aspirin as he had not been bleeding however the bleeding resumed and continues this morning. Hemoglobin has dropped from 12 down to 8.9 over the last month, hemoglobin of 10.5 one week ago. No abdominal pain, no nausea, no nonsteroidal use. No bleeding overnight, generally weak, ready for colonoscopy ______________________________________________________________________  ROS: Review of systems is unremarkable for any active cardiac,respiratory, GI, GU, hematologic, neurologic or psychiatric systems, 10 systems reviewed.  . pantoprazole (PROTONIX) IV  40 mg Intravenous Q12H   acetaminophen **OR** acetaminophen, albuterol, ondansetron **OR** ondansetron (ZOFRAN) IV, traMADol   Past Medical History  Diagnosis Date  . Blood clotting tendency   . Melanoma   . Hypertension     Past Surgical History  Procedure Laterality Date  . Cholecystectomy      PHYSICAL EXAM:  BP 132/68 mmHg  Pulse 70  Temp(Src) 98.4 F (36.9 C) (Oral)  Resp 16  Ht 5\' 11"  (1.803 m)  Wt 84.55 kg (186 lb 6.4 oz)  BMI 26.01 kg/m2  SpO2 100%  Wt Readings from Last 3 Encounters:  10/12/14 84.55 kg (186 lb 6.4 oz)           BP Readings from Last 3 Encounters:  10/15/14 132/68    Constitutional: NAD Neck: supple, no thyromegaly Respiratory: CTA, no rales or wheezes Cardiovascular: RRR, no murmur, no gallop Abdomen: soft, good BS, nontender Extremities: no edema Neuro: alert and oriented, no focal motor or sensory deficits  ASSESSMENT/PLAN:  Labs and imaging studies were reviewed  GI bleeding-GI consult, IV Protonix, off aspirin and Coumadin, no recurrent bleeding, endoscopy today, hgb stable History  DVT/PE-off Coumadin, consider aspirin alone post procedure Osteoarthritis-tramadol when necessary CKD-baseline creatinine 1.6 Home this pm if stable

## 2014-10-15 NOTE — Anesthesia Preprocedure Evaluation (Addendum)
Anesthesia Evaluation  Patient identified by MRN, date of birth, ID band Patient awake    Reviewed: Allergy & Precautions, H&P , NPO status , Patient's Chart, lab work & pertinent test results, reviewed documented beta blocker date and time   Airway Mallampati: II  TM Distance: >3 FB Neck ROM: full    Dental no notable dental hx.    Pulmonary neg pulmonary ROS,  breath sounds clear to auscultation  Pulmonary exam normal       Cardiovascular Exercise Tolerance: Good hypertension, negative cardio ROS  Rhythm:regular Rate:Normal     Neuro/Psych negative neurological ROS  negative psych ROS   GI/Hepatic negative GI ROS, Neg liver ROS,   Endo/Other  negative endocrine ROS  Renal/GU negative Renal ROS  negative genitourinary   Musculoskeletal   Abdominal   Peds  Hematology negative hematology ROS (+) anemia ,   Anesthesia Other Findings   Reproductive/Obstetrics negative OB ROS                            Anesthesia Physical Anesthesia Plan  ASA: II  Anesthesia Plan: General   Post-op Pain Management:    Induction:   Airway Management Planned:   Additional Equipment:   Intra-op Plan:   Post-operative Plan:   Informed Consent: I have reviewed the patients History and Physical, chart, labs and discussed the procedure including the risks, benefits and alternatives for the proposed anesthesia with the patient or authorized representative who has indicated his/her understanding and acceptance.   Dental Advisory Given  Plan Discussed with: CRNA  Anesthesia Plan Comments:         Anesthesia Quick Evaluation

## 2014-10-15 NOTE — Discharge Summary (Signed)
                                                                                    [place], is a 81 y.o. male  DOB 03-12-1930  MRN [id number].  Admission date:  10/12/2014  Admitting Physician  Rusty Aus, MD  Discharge Date:  10/15/2014    Admission Diagnosis  Lower GI bleed [K92.2]  Discharge Diagnoses   Acute lower diverticular GI bleed Osteoarthritis History pulmonary emboli Hypertension CKD 3  Past Medical History  Diagnosis Date  . Blood clotting tendency   . Melanoma   . Hypertension     Past Surgical History  Procedure Laterality Date  . Cholecystectomy    . Insert / replace / remove pacemaker         History of present illness and  Hospital Course:     Kindly see H&P for history of present illness and admission details, please review complete Labs, Consult reports and Test reports for all details in brief  HPI  from the history and physical done on the day of admission    Hospital Course    Patient admitted with persistent lower GI bleed, hgb dropping and stabilizing at 8.4. No chest pain. Colonoscopy showed diverticulosis with evidence for probable diverticular bleeding site. Patient will remain off aspirin until f/u appt,    Discharge Condition: stable   Follow UP  1 week Dr Sabra Heck    Discharge Instructions  and  Discharge Medications  Norvasc 5mg  qd Aciphex 20mg  qd Tramadol 50mg  tid prn pain B12 1050mcg daily   Today   Subjective:   [place] stable post procedure  Objective:   Blood pressure 121/65, pulse 55, temperature 97.5 F (36.4 C), temperature source Oral, resp. rate 18, height 5\' 11"  (1.803 m), weight 84.55 kg (186 lb 6.4 oz), SpO2 100 %.   Exam No abd pain  Total Time in preparing paper work, data evaluation and todays exam - 35 minutes  MILLER,MARK F. M.D on 10/15/2014 at 1:36 PM

## 2014-10-15 NOTE — Progress Notes (Signed)
Pt A and O x 4. VSS Pt voiding with no difficulties. No active GI bleeding. Pt had minimal complaints of pain with meds given to control. Pt went down to have colonscopy done today. Pt was able to eat following coming back to floor. Pt had IV removed. Pt given discharge instructions and voiced that he understood with no further questions. Pt left via wheelchair with auxillary.

## 2014-10-16 ENCOUNTER — Encounter: Payer: Self-pay | Admitting: Gastroenterology

## 2014-10-16 LAB — SURGICAL PATHOLOGY

## 2015-01-01 ENCOUNTER — Encounter: Payer: Self-pay | Admitting: Emergency Medicine

## 2015-01-01 ENCOUNTER — Observation Stay
Admission: EM | Admit: 2015-01-01 | Discharge: 2015-01-02 | Disposition: A | Discharge status: Home / Self Care | Payer: Medicare Other | Attending: Internal Medicine | Admitting: Internal Medicine

## 2015-01-01 DIAGNOSIS — K648 Other hemorrhoids: Secondary | ICD-10-CM | POA: Diagnosis not present

## 2015-01-01 DIAGNOSIS — Z7901 Long term (current) use of anticoagulants: Secondary | ICD-10-CM | POA: Insufficient documentation

## 2015-01-01 DIAGNOSIS — M1990 Unspecified osteoarthritis, unspecified site: Secondary | ICD-10-CM | POA: Diagnosis not present

## 2015-01-01 DIAGNOSIS — Z950 Presence of cardiac pacemaker: Secondary | ICD-10-CM | POA: Diagnosis not present

## 2015-01-01 DIAGNOSIS — I129 Hypertensive chronic kidney disease with stage 1 through stage 4 chronic kidney disease, or unspecified chronic kidney disease: Secondary | ICD-10-CM | POA: Insufficient documentation

## 2015-01-01 DIAGNOSIS — I482 Chronic atrial fibrillation: Secondary | ICD-10-CM | POA: Insufficient documentation

## 2015-01-01 DIAGNOSIS — Z7982 Long term (current) use of aspirin: Secondary | ICD-10-CM | POA: Diagnosis not present

## 2015-01-01 DIAGNOSIS — D509 Iron deficiency anemia, unspecified: Secondary | ICD-10-CM | POA: Insufficient documentation

## 2015-01-01 DIAGNOSIS — T45511A Poisoning by anticoagulants, accidental (unintentional), initial encounter: Secondary | ICD-10-CM

## 2015-01-01 DIAGNOSIS — N189 Chronic kidney disease, unspecified: Secondary | ICD-10-CM | POA: Insufficient documentation

## 2015-01-01 DIAGNOSIS — K922 Gastrointestinal hemorrhage, unspecified: Secondary | ICD-10-CM | POA: Diagnosis not present

## 2015-01-01 DIAGNOSIS — D62 Acute posthemorrhagic anemia: Secondary | ICD-10-CM | POA: Insufficient documentation

## 2015-01-01 DIAGNOSIS — Z85820 Personal history of malignant melanoma of skin: Secondary | ICD-10-CM | POA: Insufficient documentation

## 2015-01-01 DIAGNOSIS — Z86711 Personal history of pulmonary embolism: Secondary | ICD-10-CM | POA: Diagnosis not present

## 2015-01-01 DIAGNOSIS — K625 Hemorrhage of anus and rectum: Secondary | ICD-10-CM | POA: Diagnosis not present

## 2015-01-01 DIAGNOSIS — Z86718 Personal history of other venous thrombosis and embolism: Secondary | ICD-10-CM | POA: Insufficient documentation

## 2015-01-01 DIAGNOSIS — Z9049 Acquired absence of other specified parts of digestive tract: Secondary | ICD-10-CM | POA: Insufficient documentation

## 2015-01-01 DIAGNOSIS — Z8249 Family history of ischemic heart disease and other diseases of the circulatory system: Secondary | ICD-10-CM | POA: Diagnosis not present

## 2015-01-01 DIAGNOSIS — K644 Residual hemorrhoidal skin tags: Secondary | ICD-10-CM | POA: Insufficient documentation

## 2015-01-01 DIAGNOSIS — Z5181 Encounter for therapeutic drug level monitoring: Secondary | ICD-10-CM | POA: Diagnosis present

## 2015-01-01 HISTORY — DX: Unspecified osteoarthritis, unspecified site: M19.90

## 2015-01-01 HISTORY — DX: Unspecified atrial fibrillation: I48.91

## 2015-01-01 HISTORY — DX: Acute embolism and thrombosis of unspecified deep veins of unspecified lower extremity: I82.409

## 2015-01-01 HISTORY — DX: Presence of cardiac pacemaker: Z95.0

## 2015-01-01 HISTORY — DX: Chronic kidney disease, unspecified: N18.9

## 2015-01-01 LAB — PROTIME-INR
INR: 2.74
Prothrombin Time: 29.1 seconds — ABNORMAL HIGH (ref 11.4–15.0)

## 2015-01-01 LAB — COMPREHENSIVE METABOLIC PANEL
ALT: 15 U/L — ABNORMAL LOW (ref 17–63)
AST: 24 U/L (ref 15–41)
Albumin: 3.8 g/dL (ref 3.5–5.0)
Alkaline Phosphatase: 62 U/L (ref 38–126)
Anion gap: 9 (ref 5–15)
BUN: 26 mg/dL — ABNORMAL HIGH (ref 6–20)
CO2: 26 mmol/L (ref 22–32)
Calcium: 9.4 mg/dL (ref 8.9–10.3)
Chloride: 109 mmol/L (ref 101–111)
Creatinine, Ser: 1.75 mg/dL — ABNORMAL HIGH (ref 0.61–1.24)
GFR, EST AFRICAN AMERICAN: 40 mL/min — AB (ref >60)
GFR, EST NON AFRICAN AMERICAN: 34 mL/min — AB (ref >60)
GLUCOSE: 106 mg/dL — AB (ref 65–99)
POTASSIUM: 4.6 mmol/L (ref 3.5–5.1)
Sodium: 144 mmol/L (ref 135–145)
Total Bilirubin: 0.7 mg/dL (ref 0.3–1.2)
Total Protein: 6.5 g/dL (ref 6.5–8.1)

## 2015-01-01 LAB — CBC
HCT: 37.3 % — ABNORMAL LOW (ref 40.0–52.0)
HEMOGLOBIN: 12.1 g/dL — AB (ref 13.0–18.0)
MCH: 30.6 pg (ref 26.0–34.0)
MCHC: 32.5 g/dL (ref 32.0–36.0)
MCV: 94.4 fL (ref 80.0–100.0)
Platelets: 165 10*3/uL (ref 150–440)
RBC: 3.95 MIL/uL — AB (ref 4.40–5.90)
RDW: 19.3 % — AB (ref 11.5–14.5)
WBC: 8.2 10*3/uL (ref 3.8–10.6)

## 2015-01-01 LAB — TYPE AND SCREEN
ABO/RH(D): A POS
Antibody Screen: NEGATIVE

## 2015-01-01 LAB — HEMOGLOBIN
Hemoglobin: 11.6 g/dL — ABNORMAL LOW (ref 13.0–18.0)
Hemoglobin: 11 g/dL — ABNORMAL LOW (ref 13.0–18.0)

## 2015-01-01 LAB — ABO/RH: ABO/RH(D): A POS

## 2015-01-01 MED ORDER — PHYTONADIONE 5 MG PO TABS
5.0000 mg | ORAL_TABLET | Freq: Once | ORAL | Status: AC
  Administered 2015-01-01: 5 mg via ORAL
  Filled 2015-01-01: qty 1

## 2015-01-01 MED ORDER — AMLODIPINE BESYLATE 5 MG PO TABS
5.0000 mg | ORAL_TABLET | Freq: Every day | ORAL | Status: DC
  Administered 2015-01-02: 5 mg via ORAL
  Filled 2015-01-01: qty 1

## 2015-01-01 MED ORDER — VITAMIN D 1000 UNITS PO TABS
1000.0000 [IU] | ORAL_TABLET | Freq: Every day | ORAL | Status: DC
  Administered 2015-01-02: 1000 [IU] via ORAL
  Filled 2015-01-01: qty 1

## 2015-01-01 MED ORDER — PANTOPRAZOLE SODIUM 40 MG IV SOLR
40.0000 mg | Freq: Two times a day (BID) | INTRAVENOUS | Status: DC
  Administered 2015-01-01 (×2): 40 mg via INTRAVENOUS
  Filled 2015-01-01 (×3): qty 40

## 2015-01-01 MED ORDER — TRAMADOL HCL 50 MG PO TABS
100.0000 mg | ORAL_TABLET | Freq: Three times a day (TID) | ORAL | Status: DC | PRN
  Administered 2015-01-01 – 2015-01-02 (×2): 100 mg via ORAL
  Filled 2015-01-01 (×2): qty 2

## 2015-01-01 MED ORDER — VITAMIN B-12 1000 MCG PO TABS
1000.0000 ug | ORAL_TABLET | Freq: Every day | ORAL | Status: DC
  Administered 2015-01-02: 1000 ug via ORAL
  Filled 2015-01-01: qty 1

## 2015-01-01 MED ORDER — FERROUS SULFATE 325 (65 FE) MG PO TABS
325.0000 mg | ORAL_TABLET | Freq: Two times a day (BID) | ORAL | Status: DC
  Administered 2015-01-01 – 2015-01-02 (×2): 325 mg via ORAL
  Filled 2015-01-01 (×2): qty 1

## 2015-01-01 NOTE — Consult Note (Signed)
[facility]  [address], [place], [place] [address] Phone: [phone] Fax : [phone]  Consultation  Referring Provider:     No ref. provider found Primary Care Physician:  Rusty Aus., MD Primary Gastroenterologist:  Dr. Eliott Nine          Reason for Consultation:     Hematochezia  Date of Admission:  01/01/2015 Date of Consultation:  01/01/2015         HPI:   [place] is a 81 y.o. male who comes in with a report of rectal bleeding. The patient had a few episodes of rectal bleeding yesterday one episode of rectal bleeding this morning. Because of the rectal bleeding this morning he decided to come to the hospital. The patient was seen last month by gastrology for rectal bleeding and that time had a colonoscopy that showed him to have internal and external hemorrhoids with a few ascending colon diverticuli and colon polyps. The patient was sent home and has been on Coumadin for a history of clots. The patient then comes back because he was told by his primary care provider that if he has bleeding from more than 2 days he should come to the hospital. The patient is concerned when he saw her bleeding this morning which was the second day. The patient's hemoglobin on admission was 12.1 with repeat of 11.6. The patient denies any abdominal pain nausea vomiting fevers or chills. He also reports that the blood is usually bright red although sometimes he has some clots.  Past Medical History  Diagnosis Date  . Blood clotting tendency   . Melanoma   . Hypertension   . Pacemaker   . DVT (deep venous thrombosis)   . Chronic kidney disease   . Arthritis   . Atrial fibrillation     Past Surgical History  Procedure Laterality Date  . Cholecystectomy    . Insert / replace / remove pacemaker    . Colonoscopy with propofol N/A 10/15/2014    Procedure: COLONOSCOPY WITH PROPOFOL;  Surgeon: Josefine Class, MD;  Location: [facility] ENDOSCOPY;  Service: Endoscopy;   Laterality: N/A;    Prior to Admission medications   Medication Sig Start Date End Date Taking? Authorizing Provider  ALOE PO Take 90 mLs by mouth daily.   Yes Historical Provider, MD  amLODipine (NORVASC) 5 MG tablet Take 5 mg by mouth daily.   Yes Historical Provider, MD  Cholecalciferol (VITAMIN D3 PO) Take 1 tablet by mouth daily.   Yes Historical Provider, MD  Coenzyme Q10 (CO Q 10 PO) Take 1 capsule by mouth daily.   Yes Historical Provider, MD  Iron-Vitamin C 65-125 MG TABS Take 1 tablet by mouth 2 (two) times daily.   Yes Historical Provider, MD  RABEprazole (ACIPHEX) 20 MG tablet Take 20 mg by mouth 2 (two) times daily.   Yes Historical Provider, MD  traMADol (ULTRAM) 50 MG tablet Take 100 mg by mouth 3 (three) times daily as needed for moderate pain.   Yes Historical Provider, MD  vitamin B-12 (CYANOCOBALAMIN) 1000 MCG tablet Take 1,000 mcg by mouth daily.   Yes Historical Provider, MD  aspirin EC 325 MG tablet Take 325 mg by mouth daily.    Historical Provider, MD    Family History  Problem Relation Age of Onset  . CAD Father   . Prostate cancer Brother      History  Substance Use Topics  . Smoking status: Never Smoker   . Smokeless tobacco:  Not on file  . Alcohol Use: No    Allergies as of 01/01/2015  . (No Known Allergies)    Review of Systems:    All systems reviewed and negative except where noted in HPI.   Physical Exam:  Vital signs in last 24 hours: Temp:  [98.2 F (36.8 C)-98.3 F (36.8 C)] 98.2 F (36.8 C) (07/29 1058) Pulse Rate:  [58-89] 89 (07/29 1058) Resp:  [11-19] 16 (07/29 1058) BP: (100-135)/(52-93) 135/79 mmHg (07/29 1058) SpO2:  [95 %-96 %] 96 % (07/29 1058) Weight:  [185 lb (83.915 kg)] 185 lb (83.915 kg) (07/29 0813) Last BM Date: 12/31/14 General:   Pleasant, cooperative in NAD Head:  Normocephalic and atraumatic. Eyes:   No icterus.   Conjunctiva pink. PERRLA. Ears:  Normal auditory acuity. Neck:  Supple; no masses or  thyroidomegaly Lungs: Respirations even and unlabored. Lungs clear to auscultation bilaterally.   No wheezes, crackles, or rhonchi.  Heart:  Regular rate and rhythm;  Without murmur, clicks, rubs or gallops Abdomen:  Soft, nondistended, nontender. Normal bowel sounds. No appreciable masses or hepatomegaly.  No rebound or guarding.  Rectal:  Not performed. Msk:  Symmetrical without gross deformities.  Strength  Extremities:  Without edema, cyanosis or clubbing. Neurologic:  Alert and oriented x3;  grossly normal neurologically. Skin:  Intact without significant lesions or rashes. Cervical Nodes:  No significant cervical adenopathy. Psych:  Alert and cooperative. Normal affect.  LAB RESULTS:  Recent Labs  01/01/15 0835 01/01/15 1255  WBC 8.2  --   HGB 12.1* 11.6*  HCT 37.3*  --   PLT 165  --    BMET  Recent Labs  01/01/15 0835  NA 144  K 4.6  CL 109  CO2 26  GLUCOSE 106*  BUN 26*  CREATININE 1.75*  CALCIUM 9.4   LFT  Recent Labs  01/01/15 0835  PROT 6.5  ALBUMIN 3.8  AST 24  ALT 15*  ALKPHOS 62  BILITOT 0.7   PT/INR  Recent Labs  01/01/15 0835  LABPROT 29.1*  INR 2.74    STUDIES: No results found.    Impression / Plan:   [name] is a 81 y.o. y/o male with a history of lower GI bleeding who is on Coumadin. The patient has had a colonoscopy last month for the same issue and the only spot possible signs of blood loss was internal and external hemorrhoids and diverticulosis. It was suggested that the patient may need an upper endoscopy if he continued to bleed but he insists that the blood is bright in color and with his hemoglobin being 12.1 on admission and it is unlikely that is having bright red blood from an upper GI bleed without being hypotensive and tachycardic. The patient has had no bleeding since this morning and I will follow him during this hospital stay. If he has any massive amounts of bleeding he should undergo a bleeding scan. A  repeat colonoscopy is not recommended at this time.   Thank you for involving me in the care of this patient.        Ollen Bowl, MD  01/01/2015, 3:21 PM

## 2015-01-01 NOTE — ED Notes (Signed)
Pt daughter reports pt with Rectal bleeding since yesterday. Pt daughter reports pt takes warfarin but has not taken it in 2 days due to the bleeding. Pt recently had a bleed and last hbg at the end of June was 10.8. Pt denies pain. Pt called PMD and was advised to come to the ED if bleeding did not stop.

## 2015-01-01 NOTE — H&P (Addendum)
[facility] NAME: [place]    MR#:  [id number]  DATE OF BIRTH:  March 23, 1930  DATE OF ADMISSION:  01/01/2015  PRIMARY CARE PHYSICIAN: Rusty Aus., MD   REQUESTING/REFERRING PHYSICIAN: [email or web address]  CHIEF COMPLAINT:   Chief Complaint  Patient presents with  . Rectal Bleeding    HISTORY OF PRESENT ILLNESS: [place]  is a 81 y.o. 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 For last 2 days again started rectal bleed, frank red blood. No dizziness, Nausea, abdominal pain. Hb stable. INR 2.7- so given for admission.  PAST MEDICAL HISTORY:   Past Medical History  Diagnosis Date  . Blood clotting tendency   . Melanoma   . Hypertension   . Pacemaker   . DVT (deep venous thrombosis)   . Chronic kidney disease   . Arthritis   . Atrial fibrillation     PAST SURGICAL HISTORY:  Past Surgical History  Procedure Laterality Date  . Cholecystectomy    . Insert / replace / remove pacemaker    . Colonoscopy with propofol N/A 10/15/2014    Procedure: COLONOSCOPY WITH PROPOFOL;  Surgeon: Josefine Class, MD;  Location: [facility] ENDOSCOPY;  Service: Endoscopy;  Laterality: N/A;    SOCIAL HISTORY:  History  Substance Use Topics  . Smoking status: Never Smoker   . Smokeless tobacco: Not on file  . Alcohol Use: No    FAMILY HISTORY:  Family History  Problem Relation Age of Onset  . CAD Father   . Prostate cancer Brother     DRUG ALLERGIES: No Known Allergies  REVIEW OF SYSTEMS:   CONSTITUTIONAL: No fever, fatigue or weakness.  EYES: No blurred or double vision.  EARS, NOSE, AND THROAT: No tinnitus or ear pain.  RESPIRATORY: No cough, shortness of breath, wheezing or hemoptysis.  CARDIOVASCULAR: No chest pain, orthopnea, edema.  GASTROINTESTINAL: No nausea, vomiting, diarrhea or abdominal pain. Rectal  bleed. GENITOURINARY: No dysuria, hematuria.  ENDOCRINE: No polyuria, nocturia,  HEMATOLOGY: No anemia, easy bruising or bleeding SKIN: No rash or lesion. MUSCULOSKELETAL: No joint pain or arthritis.   NEUROLOGIC: No tingling, numbness, weakness.  PSYCHIATRY: No anxiety or depression.   MEDICATIONS AT HOME:  Prior to Admission medications   Medication Sig Start Date End Date Taking? Authorizing Provider  ALOE PO Take 90 mLs by mouth daily.   Yes Historical Provider, MD  amLODipine (NORVASC) 5 MG tablet Take 5 mg by mouth daily.   Yes Historical Provider, MD  aspirin EC 325 MG tablet Take 325 mg by mouth daily.   Yes Historical Provider, MD  Cholecalciferol (VITAMIN D3 PO) Take 1 tablet by mouth daily.   Yes Historical Provider, MD  Iron-Vitamin C 65-125 MG TABS Take 1 tablet by mouth 2 (two) times daily.   Yes Historical Provider, MD  RABEprazole (ACIPHEX) 20 MG tablet Take 20 mg by mouth 2 (two) times daily.   Yes Historical Provider, MD  traMADol (ULTRAM) 50 MG tablet Take 100 mg by mouth 3 (three) times daily as needed for moderate pain.   Yes Historical Provider, MD  vitamin B-12 (CYANOCOBALAMIN) 1000 MCG tablet Take 1,000 mcg by mouth daily.   Yes Historical Provider, MD  Coenzyme Q10 (CO Q 10 PO) Take 1 capsule by mouth daily.    Historical Provider, MD      PHYSICAL EXAMINATION:  VITAL SIGNS: Blood pressure 112/78, pulse 61, temperature 98.3 F (36.8 C), temperature source Oral, resp. rate 19, height 5\' 11"  (1.803 m), weight 83.915 kg (185 lb), SpO2 96 %.  GENERAL:  81 y.o.-year-old patient lying in the bed with no acute distress.  EYES: Pupils equal, round, reactive to light and accommodation. No scleral icterus. Extraocular muscles intact.  HEENT: Head atraumatic, normocephalic. Oropharynx and nasopharynx clear.  NECK:  Supple, no jugular venous distention. No thyroid enlargement, no tenderness.  LUNGS: Normal breath sounds bilaterally, no wheezing, rales,rhonchi or  crepitation. No use of accessory muscles of respiration.  CARDIOVASCULAR: S1, S2 normal. No murmurs, rubs, or gallops.  ABDOMEN: Soft, nontender, nondistended. Bowel sounds present. No organomegaly or mass. As per ER- guiac positive, blood on underwear. EXTREMITIES: No pedal edema, cyanosis, or clubbing.  NEUROLOGIC: Cranial nerves II through XII are intact. Muscle strength 5/5 in all extremities. Sensation intact. Gait not checked.  PSYCHIATRIC: The patient is alert and oriented x 3.  SKIN: No obvious rash, lesion, or ulcer.   LABORATORY PANEL:   CBC  Recent Labs Lab 01/01/15 0835  WBC 8.2  HGB 12.1*  HCT 37.3*  PLT 165  MCV 94.4  MCH 30.6  MCHC 32.5  RDW 19.3*   ------------------------------------------------------------------------------------------------------------------  Chemistries   Recent Labs Lab 01/01/15 0835  NA 144  K 4.6  CL 109  CO2 26  GLUCOSE 106*  BUN 26*  CREATININE 1.75*  CALCIUM 9.4  AST 24  ALT 15*  ALKPHOS 62  BILITOT 0.7   ------------------------------------------------------------------------------------------------------------------ estimated creatinine clearance is 34.1 mL/min (by C-G formula based on Cr of 1.75). ------------------------------------------------------------------------------------------------------------------ No results for input(s): TSH, T4TOTAL, T3FREE, THYROIDAB in the last 72 hours.  Invalid input(s): FREET3   Coagulation profile  Recent Labs Lab 01/01/15 0835  INR 2.74   ------------------------------------------------------------------------------------------------------------------- No results for input(s): DDIMER in the last 72 hours. -------------------------------------------------------------------------------------------------------------------  Cardiac Enzymes No results for input(s): CKMB, TROPONINI, MYOGLOBIN in the last 168 hours.  Invalid input(s):  CK ------------------------------------------------------------------------------------------------------------------ Invalid input(s): POCBNP  ---------------------------------------------------------------------------------------------------------------  Urinalysis    Component Value Date/Time   COLORURINE Yellow 09/18/2012 2138   APPEARANCEUR Clear 09/18/2012 2138   LABSPEC 1.014 09/18/2012 2138   PHURINE 5.0 09/18/2012 2138   GLUCOSEU Negative 09/18/2012 2138   HGBUR Negative 09/18/2012 2138   BILIRUBINUR Negative 09/18/2012 2138   KETONESUR Negative 09/18/2012 2138   PROTEINUR Negative 09/18/2012 2138   NITRITE Negative 09/18/2012 2138   LEUKOCYTESUR Trace 09/18/2012 2138     RADIOLOGY: No results found.  IMPRESSION AND PLAN:  * Rectal bleed   Monitor Hb 8 hrly.   Consult GI.   No NEed for transfusion now.   Hold coumadin for now. Vit K oral given.   PPI BID and Liquid diet.  * Hx of recurrent DVT   He need anticoagulant- hold for now due to active bleed.  * CKD- stable.  * HTN   Cont amlodipin All the records are reviewed and case discussed with ED provider. Management plans discussed with the patient, family and they are in agreement.  CODE STATUS: Full   TOTAL TIME TAKING CARE OF THIS PATIENT: 50 minutes.    Vaughan Basta M.D on 01/01/2015   Between 7am to 6pm - Pager - [phone]  After 6pm go to [email or web address] - password [email or web address] [facility]  [facility]  Office  [phone]  CC: Primary care physician; Rusty Aus., MD

## 2015-01-01 NOTE — ED Provider Notes (Signed)
[facility] Emergency Department Provider Note  ____________________________________________  Time seen: 2  I have reviewed the triage vital signs and the nursing notes.   HISTORY  Chief Complaint Rectal Bleeding     HPI Howard Harmon is a 81 y.o. male who is on Coumadin due to a history of DVT and pulmonary emboli 5 years ago. He presents today with rectal bleeding that began yesterday. Because the rectal bleeding began yesterday, he did not take his morning dose of Coumadin, thus his last dose was 2 days ago. He had his INR checkedapproximately one month ago. He does not recall if any change in his dosing was made at that time.  The patient has had similar symptoms in the past. He had rectal bleeding proximally 3 months ago. At that time he had a colonoscopy by Dr. Vira Agar. His primary physician is Emily Filbert.  The patient denies any abdominal pain or nausea or vomiting.  He does have a history of anemia. He is currently taking "double dose" iron.    Past Medical History  Diagnosis Date  . Blood clotting tendency   . Melanoma   . Hypertension   . Pacemaker     Patient Active Problem List   Diagnosis Date Noted  . GIB (gastrointestinal bleeding) 10/12/2014  . Anemia 10/12/2014  . CKD (chronic kidney disease) stage 3, GFR 30-59 ml/min 10/12/2014    Past Surgical History  Procedure Laterality Date  . Cholecystectomy    . Insert / replace / remove pacemaker    . Colonoscopy with propofol N/A 10/15/2014    Procedure: COLONOSCOPY WITH PROPOFOL;  Surgeon: Josefine Class, MD;  Location: [facility] ENDOSCOPY;  Service: Endoscopy;  Laterality: N/A;    Current Outpatient Rx  Name  Route  Sig  Dispense  Refill  . ALOE PO   Oral   Take 90 mLs by mouth daily.         Marland Kitchen amLODipine (NORVASC) 5 MG tablet   Oral   Take 5 mg by mouth daily.         Marland Kitchen aspirin EC 325 MG tablet   Oral   Take 325 mg by mouth daily.         .  Cholecalciferol (VITAMIN D3 PO)   Oral   Take 1 tablet by mouth daily.         . Iron-Vitamin C 65-125 MG TABS   Oral   Take 1 tablet by mouth 2 (two) times daily.         . RABEprazole (ACIPHEX) 20 MG tablet   Oral   Take 20 mg by mouth 2 (two) times daily.         . traMADol (ULTRAM) 50 MG tablet   Oral   Take 100 mg by mouth 3 (three) times daily as needed for moderate pain.         . vitamin B-12 (CYANOCOBALAMIN) 1000 MCG tablet   Oral   Take 1,000 mcg by mouth daily.         . Coenzyme Q10 (CO Q 10 PO)   Oral   Take 1 capsule by mouth daily.           Allergies Review of patient's allergies indicates no known allergies.  No family history on file.  Social History History  Substance Use Topics  . Smoking status: Never Smoker   . Smokeless tobacco: Not on file  . Alcohol Use: No    Review of Systems  Constitutional: Negative for fever. ENT: Negative for sore throat. Cardiovascular: Negative for chest pain. Respiratory: Negative for shortness of breath. Gastrointestinal: Negative for abdominal pain, vomiting and diarrhea. Positive for rectal bleeding. Genitourinary: Negative for dysuria. Musculoskeletal: No myalgias or injuries. Skin: Negative for rash. Neurological: Negative for headaches   10-point ROS otherwise negative.  ____________________________________________   PHYSICAL EXAM:  VITAL SIGNS: ED Triage Vitals  Enc Vitals Group     BP 01/01/15 0813 100/52 mmHg     Pulse Rate 01/01/15 0813 63     Resp 01/01/15 0813 18     Temp 01/01/15 0813 98.3 F (36.8 C)     Temp Source 01/01/15 0813 Oral     SpO2 01/01/15 0813 95 %     Weight 01/01/15 0813 185 lb (83.915 kg)     Height 01/01/15 0813 5\' 11"  (1.803 m)     Head Cir --      Peak Flow --      Pain Score 01/01/15 0814 0     Pain Loc --      Pain Edu? --      Excl. in [place]? --     Constitutional: Alert and oriented. Well appearing and in no distress. ENT   Head:  Normocephalic and atraumatic.   Nose: No congestion/rhinnorhea.   Mouth/Throat: Mucous membranes are moist. Cardiovascular: Normal rate, irregular rhythm, no murmur noted Respiratory:  Normal respiratory effort, no tachypnea.    Breath sounds are clear and equal bilaterally.  Gastrointestinal: Soft and nontender. No distention.  Rectal: Nontender. Gross blood present around the anal area as well as on his undergarment. Scant blood noted on gloved finger following rectal exam. Heme positive by Hemoccult. Back: No muscle spasm, no tenderness, no CVA tenderness. Musculoskeletal: No deformity noted. Nontender with normal range of motion in all extremities. The right leg appears larger than the left. The patient does have a knee brace on the right knee as well as an elastic knee sleeve pulled up to his distal thigh on the right.  Neurologic:  Normal speech and language. No gross focal neurologic deficits are appreciated.  Skin:  Skin is warm, dry. No rash noted. Psychiatric: Mood and affect are normal. Speech and behavior are normal.  ____________________________________________    LABS (pertinent positives/negatives)  Labs Reviewed  COMPREHENSIVE METABOLIC PANEL - Abnormal; Notable for the following:    Glucose, Bld 106 (*)    BUN 26 (*)    Creatinine, Ser 1.75 (*)    ALT 15 (*)    GFR calc non Af Amer 34 (*)    GFR calc Af Amer 40 (*)    All other components within normal limits  CBC - Abnormal; Notable for the following:    RBC 3.95 (*)    Hemoglobin 12.1 (*)    HCT 37.3 (*)    RDW 19.3 (*)    All other components within normal limits  PROTIME-INR - Abnormal; Notable for the following:    Prothrombin Time 29.1 (*)    All other components within normal limits  TYPE AND SCREEN     ____________________________________________  ____________________________________________    INITIAL IMPRESSION / ASSESSMENT AND PLAN / ED COURSE  Pertinent labs & imaging results that were  available during my care of the patient were reviewed by me and considered in my medical decision making (see chart for details).  Pleasant, well-appearing 81 year old male in no acute distress. He is on Coumadin and currently has an INR of 2.74, despite not  having taken the medication in 48 hours. He is having notable rectal bleeding, although there is no heavy flow at this time.  His hemoglobin is a reasonable 12.1.  The patient has artery taken the importance of stopping his Coumadin. I expect his INR to begin to drop now that we are 48 hours past his last dose. We will seek admission to the hospital for ongoing rechecks of his blood count And his INR. I will treat him with vitamin K 1 now.  ----------------------------------------- 9:33 AM on 01/01/2015 -----------------------------------------  I discussed the case with Dr.Vishoni, hospitalist, who will see the patient emergency department for consideration for admission.  ____________________________________________   FINAL CLINICAL IMPRESSION(S) / ED DIAGNOSES  Final diagnoses:  Anticoagulated on Coumadin  Anticoagulant overdosage, accidental or unintentional, initial encounter  Rectal bleeding      Ahmed Prima, MD 01/01/15 [phone]

## 2015-01-01 NOTE — ED Notes (Signed)
Pt complaining of rectal bleeding x1 day , hx of same , on blood thinner, pt alert and oriented x4 .

## 2015-01-02 DIAGNOSIS — K625 Hemorrhage of anus and rectum: Secondary | ICD-10-CM | POA: Diagnosis not present

## 2015-01-02 LAB — BASIC METABOLIC PANEL
Anion gap: 6 (ref 5–15)
BUN: 19 mg/dL (ref 6–20)
CALCIUM: 8.9 mg/dL (ref 8.9–10.3)
CO2: 27 mmol/L (ref 22–32)
CREATININE: 1.36 mg/dL — AB (ref 0.61–1.24)
Chloride: 107 mmol/L (ref 101–111)
GFR calc Af Amer: 54 mL/min — ABNORMAL LOW (ref >60)
GFR, EST NON AFRICAN AMERICAN: 46 mL/min — AB (ref >60)
Glucose, Bld: 89 mg/dL (ref 65–99)
Potassium: 4.6 mmol/L (ref 3.5–5.1)
Sodium: 140 mmol/L (ref 135–145)

## 2015-01-02 LAB — CBC
HCT: 33.6 % — ABNORMAL LOW (ref 40.0–52.0)
Hemoglobin: 11 g/dL — ABNORMAL LOW (ref 13.0–18.0)
MCH: 30.5 pg (ref 26.0–34.0)
MCHC: 32.8 g/dL (ref 32.0–36.0)
MCV: 92.8 fL (ref 80.0–100.0)
Platelets: 141 10*3/uL — ABNORMAL LOW (ref 150–440)
RBC: 3.62 MIL/uL — ABNORMAL LOW (ref 4.40–5.90)
RDW: 19.3 % — AB (ref 11.5–14.5)
WBC: 8.5 10*3/uL (ref 3.8–10.6)

## 2015-01-02 LAB — PROTIME-INR
INR: 1.72
PROTHROMBIN TIME: 20.3 s — AB (ref 11.4–15.0)

## 2015-01-02 NOTE — Discharge Instructions (Signed)
Bloody Stools  Bloody stools often mean that there is a problem in the digestive tract. Your caregiver may use the term "melena" to describe black, tarry, and bad smelling stools or "hematochezia" to describe red or maroon-colored stools. Blood seen in the stool can be caused by bleeding anywhere along the intestinal tract.   A black stool usually means that blood is coming from the upper part of the gastrointestinal tract (esophagus, stomach, or small bowel). Passing maroon-colored stools or bright red blood usually means that blood is coming from lower down in the large bowel or the rectum. However, sometimes massive bleeding in the stomach or small intestine can cause bright red bloody stools.   Consuming black licorice, lead, iron pills, medicines containing bismuth subsalicylate, or blueberries can also cause black stools. Your caregiver can test black stools to see if blood is present.  It is important that the cause of the bleeding be found. Treatment can then be started, and the problem can be corrected. Rectal bleeding may not be serious, but you should not assume everything is okay until you know the cause. It is very important to follow up with your caregiver or a specialist in gastrointestinal problems.  CAUSES   Blood in the stools can come from various underlying causes. Often, the cause is not found during your first visit. Testing is often needed to discover the cause of bleeding in the gastrointestinal tract. Causes range from simple to serious or even life-threatening. Possible causes include:  · Hemorrhoids. These are veins that are full of blood (engorged) in the rectum. They cause pain, inflammation, and may bleed.  · Anal fissures. These are areas of painful tearing which may bleed. They are often caused by passing hard stool.  · Diverticulosis. These are pouches that form on the colon over time, with age, and may bleed significantly.  · Diverticulitis. This is inflammation in areas with  diverticulosis. It can cause pain, fever, and bloody stools, although bleeding is rare.  · Proctitis and colitis. These are inflamed areas of the rectum or colon. They may cause pain, fever, and bloody stools.  · Polyps and cancer. Colon cancer is a leading cause of preventable cancer death. It often starts out as precancerous polyps that can be removed during a colonoscopy, preventing progression into cancer. Sometimes, polyps and cancer may cause rectal bleeding.  · Gastritis and ulcers. Bleeding from the upper gastrointestinal tract (near the stomach) may travel through the intestines and produce black, sometimes tarry, often bad smelling stools. In certain cases, if the bleeding is fast enough, the stools may not be black, but red and the condition may be life-threatening.  SYMPTOMS   You may have stools that are bright red and bloody, that are normal color with blood on them, or that are dark black and tarry. In some cases, you may only have blood in the toilet bowl. Any of these cases need medical care. You may also have:  · Pain at the anus or anywhere in the rectum.  · Lightheadedness or feeling faint.  · Extreme weakness.  · Nausea or vomiting.  · Fever.  DIAGNOSIS  Your caregiver may use the following methods to find the cause of your bleeding:  · Taking a medical history. Age is important. Older people tend to develop polyps and cancer more often. If there is anal pain and a hard, large stool associated with bleeding, a tear of the anus may be the cause. If blood drips into the toilet after a bowel movement, bleeding hemorrhoids may be the   problem. The color and frequency of the bleeding are additional considerations. In most cases, the medical history provides clues, but seldom the final answer.  · A visual and finger (digital) exam. Your caregiver will inspect the anal area, looking for tears and hemorrhoids. A finger exam can provide information when there is tenderness or a growth inside. In men, the  prostate is also examined.  · Endoscopy. Several types of small, long scopes (endoscopes) are used to view the colon.  ¨ In the office, your caregiver may use a rigid, or more commonly, a flexible viewing sigmoidoscope. This exam is called flexible sigmoidoscopy. It is performed in 5 to 10 minutes.  ¨ A more thorough exam is accomplished with a colonoscope. It allows your caregiver to view the entire 5 to 6 foot long colon. Medicine to help you relax (sedative) is usually given for this exam. Frequently, a bleeding lesion may be present beyond the reach of the sigmoidoscope. So, a colonoscopy may be the best exam to start with. Both exams are usually done on an outpatient basis. This means the patient does not stay overnight in the hospital or surgery center.  ¨ An upper endoscopy may be needed to examine your stomach. Sedation is used and a flexible endoscope is put in your mouth, down to your stomach.  · A barium enema X-ray. This is an X-ray exam. It uses liquid barium inserted by enema into the rectum. This test alone may not identify an actual bleeding point. X-rays highlight abnormal shadows, such as those made by lumps (tumors), diverticuli, or colitis.  TREATMENT   Treatment depends on the cause of your bleeding.   · For bleeding from the stomach or colon, the caregiver doing your endoscopy or colonoscopy may be able to stop the bleeding as part of the procedure.  · Inflammation or infection of the colon can be treated with medicines.  · Many rectal problems can be treated with creams, suppositories, or warm baths.  · Surgery is sometimes needed.  · Blood transfusions are sometimes needed if you have lost a lot of blood.  · For any bleeding problem, let your caregiver know if you take aspirin or other blood thinners regularly.  HOME CARE INSTRUCTIONS   · Take any medicines exactly as prescribed.  · Keep your stools soft by eating a diet high in fiber. Prunes (1 to 3 a day) work well for many people.  · Drink  enough water and fluids to keep your urine clear or pale yellow.  · Take sitz baths if advised. A sitz bath is when you sit in a bathtub with warm water for 10 to 15 minutes to soak, soothe, and cleanse the rectal area.  · If enemas or suppositories are advised, be sure you know how to use them. Tell your caregiver if you have problems with this.  · Monitor your bowel movements to look for signs of improvement or worsening.  SEEK MEDICAL CARE IF:   · You do not improve in the time expected.  · Your condition worsens after initial improvement.  · You develop any new symptoms.  SEEK IMMEDIATE MEDICAL CARE IF:   · You develop severe or prolonged rectal bleeding.  · You vomit blood.  · You feel weak or faint.  · You have a fever.  MAKE SURE YOU:  · Understand these instructions.  · Will watch your condition.  · Will get help right away if you are not doing well or get worse.    Document Released: 05/12/2002 Document Revised: 08/14/2011 Document Reviewed: 10/07/2010  Exit[employer]® Patient Information ©2015 [employer]. This information is not intended to replace advice given to you by your health care provider. Make sure you discuss any questions you have with your health care provider.

## 2015-01-02 NOTE — Discharge Summary (Signed)
Howard Harmon, is a 81 y.o. male  DOB 1929/07/06  MRN [id number].  Admission date:  01/01/2015  Admitting Physician  Howard Basta, MD  Discharge Date:  01/02/2015    Admission Diagnosis  Rectal bleeding [K62.5] Anticoagulated on Coumadin [Z51.81, Z79.01] Anticoagulant overdosage, accidental or unintentional, initial encounter [T45.511A]  Discharge Diagnoses    Acute lower GI bleed with acute blood loss anemia Iron deficiency anemia Chronic atrial fibrillation Chronic kidney disease Hypertension Osteoarthritis   Past Medical History  Diagnosis Date  . Blood clotting tendency   . Melanoma   . Hypertension   . Pacemaker   . DVT (deep venous thrombosis)   . Chronic kidney disease   . Arthritis   . Atrial fibrillation     Past Surgical History  Procedure Laterality Date  . Cholecystectomy    . Insert / replace / remove pacemaker    . Colonoscopy with propofol N/A 10/15/2014    Procedure: COLONOSCOPY WITH PROPOFOL;  Surgeon: Howard Class, MD;  Location: [facility] ENDOSCOPY;  Service: Endoscopy;  Laterality: N/A;       History of present illness and  Hospital Course:     Kindly see H&P for history of present illness and admission details, please review complete Labs, Consult reports and Test reports for all details in brief  HPI  from the history and physical done on the day of admission    Hospital Course    Patient was admitted with lower GI bleed. He's had this before and abated off aspirin and Coumadin. Coumadin was restarted about 3 weeks ago, now with recurrent lower GI bleeding. Endoscopy was declined by GI. Doubt upper GI source. No bleeding this morning. Hemoglobin stable at 11. We'll go home off aspirin and off Coumadin with plans to restart baby aspirin in the future as long as bleeding continues off.   Discharge Condition: Stable   Follow UP  Dr. Sabra Harmon one  week    Discharge Instructions  and  Discharge Medications  Amlodipine 5 mg daily CoQ10 daily Vitron C tab twice a day AcipHex 20 g twice a day Tramadol 100 mg 3 times a day when necessary pain Vitamin B12 daily Vitamin D3 1000 units daily   Today   Subjective:   Howard Harmon today is asymptomatic, denies any GI bleeding, ready to go home   Objective:   Blood pressure 113/74, pulse 59, temperature 98.1 F (36.7 C), temperature source Oral, resp. rate 17, height 5\' 11"  (1.803 m), weight 83.915 kg (185 lb), SpO2 97 %.   Exam Awake Alert, Oriented x 3, No new F.N deficits, Normal affect [place].AT,PERRAL Supple Neck,No JVD, No cervical lymphadenopathy appriciated.  Symmetrical Chest wall movement, Good air movement bilaterally, CTAB RRR,No Gallops,Rubs or new Murmurs, Abd Soft, Non tender, No rebound. No Cyanosis, Clubbing or edema, No new Rash or bruise  Total Time in preparing paper work, data evaluation and todays exam - 35 minutes  Howard Harmon F. M.D on 01/02/2015 at 8:47 AM

## 2015-01-02 NOTE — Progress Notes (Signed)
  [facility]  [address], [place], [place] [address] Phone: [phone] Fax : [phone]  Subjective: The patient came in with a stable hemoglobin and a lower GI bleed. He has had no further GI bleeding. The patient is tolerating food by mouth's well.  Objective: Vital signs in last 24 hours: Temp:  [98.1 F (36.7 C)-98.4 F (36.9 C)] 98.1 F (36.7 C) (07/30 0811) Pulse Rate:  [59-89] 59 (07/30 0811) Resp:  [16-17] 17 (07/30 0811) BP: (108-135)/(66-79) 113/74 mmHg (07/30 0811) SpO2:  [95 %-98 %] 97 % (07/30 0811) Last BM Date: 12/31/14 No LMP for male patient. Body mass index is 25.81 kg/(m^2). General:   Alert,  Well-developed, well-nourished, pleasant and cooperative in NAD Head:  Normocephalic and atraumatic. Eyes:  Sclera clear, no icterus.   Conjunctiva pink. Neurologic:  Alert and  oriented x4;  grossly normal neurologically. Skin:  Intact without significant lesions or rashes. Psych:  Alert and cooperative. Normal mood and affect.  Intake/Output from previous day: 07/29 0701 - 07/30 0700 In: 1200 [P.O.:1200] Out: 675 [Urine:675]  Lab Results:  Recent Labs  01/01/15 0835 01/01/15 1255 01/01/15 1909 01/02/15 0317  WBC 8.2  --   --  8.5  HGB 12.1* 11.6* 11.0* 11.0*  HCT 37.3*  --   --  33.6*  PLT 165  --   --  141*   BMET  Recent Labs  01/01/15 0835 01/02/15 0317  NA 144 140  K 4.6 4.6  CL 109 107  CO2 26 27  GLUCOSE 106* 89  BUN 26* 19  CREATININE 1.75* 1.36*  CALCIUM 9.4 8.9   LFT  Recent Labs  01/01/15 0835  PROT 6.5  ALBUMIN 3.8  AST 24  ALT 15*  ALKPHOS 62  BILITOT 0.7   PT/INR  Recent Labs  01/01/15 0835 01/02/15 0317  LABPROT 29.1* 20.3*  INR 2.74 1.72     Studies/Results: No results found.  Assessment: Active Problems:   GIB (gastrointestinal bleeding)   Rectal bleed      Plan: The patient's clinical course has been stable. The patient states he is about to be sent home today. He is  tolerating food and has had no further bleeding. The patient will follow up with outpatient gastrology.      Daren Allen Norris  01/02/2015, 10:31 AM

## 2015-01-02 NOTE — Progress Notes (Signed)
VSS, pt had no complaints of pain or nausea, tolerating diet, pt received d/c orders, instructions reviewed w/ patient and family with all questions answered.  IV removed with dressing dry and intact and pt escorted out via wheelchair. Delbert Harness, RN.

## 2015-01-26 DIAGNOSIS — M51379 Other intervertebral disc degeneration, lumbosacral region without mention of lumbar back pain or lower extremity pain: Secondary | ICD-10-CM | POA: Insufficient documentation

## 2015-01-26 DIAGNOSIS — M5137 Other intervertebral disc degeneration, lumbosacral region: Secondary | ICD-10-CM | POA: Insufficient documentation

## 2015-04-01 DIAGNOSIS — M48062 Spinal stenosis, lumbar region with neurogenic claudication: Secondary | ICD-10-CM | POA: Insufficient documentation

## 2015-11-09 ENCOUNTER — Other Ambulatory Visit: Payer: Self-pay | Admitting: Internal Medicine

## 2015-11-09 ENCOUNTER — Ambulatory Visit
Admission: RE | Admit: 2015-11-09 | Discharge: 2015-11-09 | Disposition: A | Discharge status: Home / Self Care | Payer: Medicare Other | Source: Ambulatory Visit | Attending: Internal Medicine | Admitting: Internal Medicine

## 2015-11-09 DIAGNOSIS — J9611 Chronic respiratory failure with hypoxia: Secondary | ICD-10-CM

## 2015-11-09 DIAGNOSIS — R938 Abnormal findings on diagnostic imaging of other specified body structures: Secondary | ICD-10-CM | POA: Insufficient documentation

## 2015-11-09 DIAGNOSIS — J90 Pleural effusion, not elsewhere classified: Secondary | ICD-10-CM | POA: Insufficient documentation

## 2015-11-09 DIAGNOSIS — R591 Generalized enlarged lymph nodes: Secondary | ICD-10-CM | POA: Insufficient documentation

## 2015-11-09 DIAGNOSIS — I2510 Atherosclerotic heart disease of native coronary artery without angina pectoris: Secondary | ICD-10-CM | POA: Insufficient documentation

## 2015-11-09 LAB — POCT I-STAT CREATININE: Creatinine, Ser: 1.6 mg/dL — ABNORMAL HIGH (ref 0.61–1.24)

## 2015-11-09 MED ORDER — IOPAMIDOL (ISOVUE-370) INJECTION 76%
75.0000 mL | Freq: Once | INTRAVENOUS | Status: AC | PRN
  Administered 2015-11-09: 75 mL via INTRAVENOUS

## 2015-11-10 DIAGNOSIS — I700 Atherosclerosis of aorta: Secondary | ICD-10-CM | POA: Insufficient documentation

## 2016-02-27 NOTE — Progress Notes (Signed)
[place]  [phone]) [phone] Fax:(336) [phone]  ID: Howard Harmon OB: 1930/05/12  MR#: [id number]  [id number]  Patient Care Team: Rusty Aus, MD as PCP - General (Internal Medicine)  CHIEF COMPLAINT: Iron deficiency anemia.  INTERVAL HISTORY: Patient is an 82 year old male with a long-standing history of iron deficiency anemia. He recently was on oral iron, but could not tolerate it secondary to constipation. Currently, his only complaint is of bilateral knee pain. He otherwise feels well and is asymptomatic. He does not complain of weakness or fatigue. He has no neurologic complaints. He denies any recent fevers or illnesses. He has no chest pain or shortness of breath. He denies any nausea, vomiting, constipation, or diarrhea. He has no melena or hematochezia. He has no urinary complaints. Patient otherwise feels well and offers no further specific complaints.  REVIEW OF SYSTEMS:   Review of Systems  Constitutional: Negative.  Negative for fever, malaise/fatigue and weight loss.  Respiratory: Negative.  Negative for cough and shortness of breath.   Cardiovascular: Negative.  Negative for chest pain and leg swelling.  Gastrointestinal: Negative.  Negative for abdominal pain, blood in stool and melena.  Genitourinary: Negative.   Musculoskeletal: Positive for joint pain.  Neurological: Negative.  Negative for weakness.  Psychiatric/Behavioral: Negative.  The patient is not nervous/anxious.     As per HPI. Otherwise, a complete review of systems is negative.  PAST MEDICAL HISTORY: Past Medical History:  Diagnosis Date  . Arthritis   . Atrial fibrillation ([place])   . Blood clotting tendency   . Chronic kidney disease   . DVT (deep venous thrombosis) ([place])   . Hypertension   . Melanoma ([place])   . Pacemaker     PAST SURGICAL HISTORY: Past Surgical History:  Procedure Laterality Date  . CHOLECYSTECTOMY    . COLONOSCOPY WITH PROPOFOL N/A  10/15/2014   Procedure: COLONOSCOPY WITH PROPOFOL;  Surgeon: Josefine Class, MD;  Location: [facility] ENDOSCOPY;  Service: Endoscopy;  Laterality: N/A;  . INSERT / REPLACE / REMOVE PACEMAKER      FAMILY HISTORY: Family History  Problem Relation Age of Onset  . CAD Father   . Prostate cancer Brother   . Breast cancer Sister     ADVANCED DIRECTIVES (Y/N):  N  HEALTH MAINTENANCE: Social History  Substance Use Topics  . Smoking status: Never Smoker  . Smokeless tobacco: Never Used  . Alcohol use No     Colonoscopy:  PAP:  Bone density:  Lipid panel:  No Known Allergies  Current Outpatient Prescriptions  Medication Sig Dispense Refill  . aspirin EC 81 MG tablet Take 81 mg by mouth daily.    . Cholecalciferol (VITAMIN D3 PO) Take 1 tablet by mouth daily.    Marland Kitchen docusate sodium (COLACE) 100 MG capsule Take 100-200 mg by mouth daily.    . Iron-Vitamin C 65-125 MG TABS Take 1-2 tablets by mouth daily.    Marland Kitchen LACTASE ENZYME PO Take 1 tablet by mouth as needed.    . OMEGA-3 FATTY ACIDS PO Take 1-2 capsules by mouth daily.    . RABEprazole (ACIPHEX) 20 MG tablet Take 20 mg by mouth daily.     Jordan Hawks LEAVES PO Take 470 mg by mouth daily.    . traMADol (ULTRAM) 50 MG tablet Take 100 mg by mouth 3 (three) times daily as needed for moderate pain.    . vitamin B-12 (CYANOCOBALAMIN) 1000 MCG tablet Take 1,000 mcg by mouth daily.  No current facility-administered medications for this visit.     OBJECTIVE: Vitals:   02/28/16 1457  BP: (!) 111/50  Pulse: 73  Resp: 18  Temp: (!) 95.5 F (35.3 C)     Body mass index is 25.8 kg/m.    ECOG FS:1 - Symptomatic but completely ambulatory  General: Well-developed, well-nourished, no acute distress. Eyes: Pink conjunctiva, anicteric sclera. HEENT: Normocephalic, moist mucous membranes, clear oropharnyx. Lungs: Clear to auscultation bilaterally. Heart: Regular rate and rhythm. No rubs, murmurs, or gallops. Abdomen: Soft, nontender,  nondistended. No organomegaly noted, normoactive bowel sounds. Musculoskeletal: No edema, cyanosis, or clubbing. Neuro: Alert, answering all questions appropriately. Cranial nerves grossly intact. Skin: No rashes or petechiae noted. Psych: Normal affect. Lymphatics: No cervical, calvicular, axillary or inguinal LAD.   LAB RESULTS:  Lab Results  Component Value Date   NA 140 01/02/2015   K 4.6 01/02/2015   CL 107 01/02/2015   CO2 27 01/02/2015   GLUCOSE 89 01/02/2015   BUN 19 01/02/2015   CREATININE 1.60 (H) 11/09/2015   CALCIUM 8.9 01/02/2015   PROT 6.5 01/01/2015   ALBUMIN 3.8 01/01/2015   AST 24 01/01/2015   ALT 15 (L) 01/01/2015   ALKPHOS 62 01/01/2015   BILITOT 0.7 01/01/2015   GFRNONAA 46 (L) 01/02/2015   GFRAA 54 (L) 01/02/2015    Lab Results  Component Value Date   WBC 7.4 02/28/2016   NEUTROABS 7.0 (H) 10/15/2014   HGB 7.3 (L) 02/28/2016   HCT 23.9 (L) 02/28/2016   MCV 88.4 02/28/2016   PLT 145 (L) 02/28/2016     STUDIES: No results found.  ASSESSMENT: Iron deficiency anemia  PLAN:   1. Iron deficiency anemia: Patient reports he cannot tolerate oral iron supplementation. Patient's iron stores and hemoglobin are significantly decreased today. The remainder of his laboratory work is either negative or within normal limits. Return to clinic later this week for 510 mg IV Feraheme. Patient will then return to clinic next week for a second infusion. Previously, colonoscopy in May 2016 did not reveal any significant pathology. Patient's last EGD was in November 2012. Patient will then return to clinic in 3 months with repeat laboratory work and further evaluation. 2. Thrombocytopenia: Mild, monitor. 3. Chronic renal insufficiency: Patient's creatinine appears his baseline, monitor.  Patient expressed understanding and was in agreement with this plan. He also understands that He can call clinic at any time with any questions, concerns, or complaints.    Lloyd Huger, MD   03/01/2016 10:18 PM

## 2016-02-28 ENCOUNTER — Encounter: Payer: Self-pay | Admitting: Oncology

## 2016-02-28 ENCOUNTER — Inpatient Hospital Stay: Payer: Medicare Other | Attending: Oncology | Admitting: Oncology

## 2016-02-28 ENCOUNTER — Inpatient Hospital Stay: Payer: Medicare Other

## 2016-02-28 VITALS — BP 111/50 | HR 73 | Temp 95.5°F | Resp 18 | Wt 185.0 lb

## 2016-02-28 DIAGNOSIS — I4891 Unspecified atrial fibrillation: Secondary | ICD-10-CM | POA: Diagnosis not present

## 2016-02-28 DIAGNOSIS — N189 Chronic kidney disease, unspecified: Secondary | ICD-10-CM | POA: Insufficient documentation

## 2016-02-28 DIAGNOSIS — Z7982 Long term (current) use of aspirin: Secondary | ICD-10-CM | POA: Insufficient documentation

## 2016-02-28 DIAGNOSIS — M25562 Pain in left knee: Secondary | ICD-10-CM | POA: Insufficient documentation

## 2016-02-28 DIAGNOSIS — Z79899 Other long term (current) drug therapy: Secondary | ICD-10-CM

## 2016-02-28 DIAGNOSIS — D509 Iron deficiency anemia, unspecified: Secondary | ICD-10-CM

## 2016-02-28 DIAGNOSIS — M25561 Pain in right knee: Secondary | ICD-10-CM | POA: Diagnosis not present

## 2016-02-28 DIAGNOSIS — I129 Hypertensive chronic kidney disease with stage 1 through stage 4 chronic kidney disease, or unspecified chronic kidney disease: Secondary | ICD-10-CM | POA: Insufficient documentation

## 2016-02-28 DIAGNOSIS — M1990 Unspecified osteoarthritis, unspecified site: Secondary | ICD-10-CM | POA: Insufficient documentation

## 2016-02-28 DIAGNOSIS — Z8042 Family history of malignant neoplasm of prostate: Secondary | ICD-10-CM | POA: Diagnosis not present

## 2016-02-28 DIAGNOSIS — D696 Thrombocytopenia, unspecified: Secondary | ICD-10-CM | POA: Diagnosis not present

## 2016-02-28 DIAGNOSIS — Z803 Family history of malignant neoplasm of breast: Secondary | ICD-10-CM | POA: Diagnosis not present

## 2016-02-28 DIAGNOSIS — Z85820 Personal history of malignant melanoma of skin: Secondary | ICD-10-CM

## 2016-02-28 LAB — IRON AND TIBC
IRON: 21 ug/dL — AB (ref 45–182)
Saturation Ratios: 6 % — ABNORMAL LOW (ref 17.9–39.5)
TIBC: 368 ug/dL (ref 250–450)
UIBC: 347 ug/dL

## 2016-02-28 LAB — CBC
HEMATOCRIT: 23.9 % — AB (ref 40.0–52.0)
Hemoglobin: 7.3 g/dL — ABNORMAL LOW (ref 13.0–18.0)
MCH: 27.1 pg (ref 26.0–34.0)
MCHC: 30.7 g/dL — AB (ref 32.0–36.0)
MCV: 88.4 fL (ref 80.0–100.0)
Platelets: 145 10*3/uL — ABNORMAL LOW (ref 150–440)
RBC: 2.7 MIL/uL — ABNORMAL LOW (ref 4.40–5.90)
RDW: 19.6 % — AB (ref 11.5–14.5)
WBC: 7.4 10*3/uL (ref 3.8–10.6)

## 2016-02-28 LAB — VITAMIN B12: Vitamin B-12: 4166 pg/mL — ABNORMAL HIGH (ref 180–914)

## 2016-02-28 LAB — FERRITIN: Ferritin: 23 ng/mL — ABNORMAL LOW (ref 24–336)

## 2016-02-28 LAB — FOLATE: FOLATE: 38 ng/mL (ref >5.9)

## 2016-02-28 LAB — DAT, POLYSPECIFIC AHG (ARMC ONLY): POLYSPECIFIC AHG TEST: NEGATIVE

## 2016-02-28 LAB — LACTATE DEHYDROGENASE: LDH: 158 U/L (ref 98–192)

## 2016-02-28 NOTE — Progress Notes (Signed)
New evaluation for IDA. States has intermittent, chronic knee pain.

## 2016-02-29 ENCOUNTER — Inpatient Hospital Stay: Payer: Medicare Other

## 2016-02-29 VITALS — BP 126/64 | HR 60 | Temp 97.0°F | Resp 18

## 2016-02-29 DIAGNOSIS — D509 Iron deficiency anemia, unspecified: Secondary | ICD-10-CM

## 2016-02-29 LAB — HAPTOGLOBIN: Haptoglobin: 224 mg/dL — ABNORMAL HIGH (ref 34–200)

## 2016-02-29 MED ORDER — SODIUM CHLORIDE 0.9 % IV SOLN
Freq: Once | INTRAVENOUS | Status: AC
  Administered 2016-02-29: 15:00:00 via INTRAVENOUS
  Filled 2016-02-29: qty 1000

## 2016-02-29 MED ORDER — SODIUM CHLORIDE 0.9 % IV SOLN
510.0000 mg | Freq: Once | INTRAVENOUS | Status: AC
  Administered 2016-02-29: 510 mg via INTRAVENOUS
  Filled 2016-02-29: qty 17

## 2016-03-06 ENCOUNTER — Ambulatory Visit: Payer: Medicare Other

## 2016-03-07 ENCOUNTER — Inpatient Hospital Stay: Payer: Medicare Other | Attending: Oncology

## 2016-03-07 VITALS — BP 131/65 | HR 60 | Temp 97.1°F | Resp 18

## 2016-03-07 DIAGNOSIS — D696 Thrombocytopenia, unspecified: Secondary | ICD-10-CM | POA: Diagnosis not present

## 2016-03-07 DIAGNOSIS — D509 Iron deficiency anemia, unspecified: Secondary | ICD-10-CM | POA: Diagnosis not present

## 2016-03-07 MED ORDER — SODIUM CHLORIDE 0.9 % IV SOLN
Freq: Once | INTRAVENOUS | Status: AC
  Administered 2016-03-07: 15:00:00 via INTRAVENOUS
  Filled 2016-03-07: qty 1000

## 2016-03-07 MED ORDER — FERUMOXYTOL INJECTION 510 MG/17 ML
510.0000 mg | Freq: Once | INTRAVENOUS | Status: AC
  Administered 2016-03-07: 510 mg via INTRAVENOUS
  Filled 2016-03-07: qty 17

## 2016-03-13 ENCOUNTER — Ambulatory Visit: Payer: Medicare Other

## 2016-05-30 ENCOUNTER — Other Ambulatory Visit: Payer: Self-pay | Admitting: Oncology

## 2016-05-30 NOTE — Progress Notes (Signed)
[place]  Telephone:(336) [phone] Fax:(336) [phone]  ID: Howard Harmon OB: [birth year]  MR#: [id number]  [id number]  Patient Care Team: Howard Aus, MD as PCP - General (Internal Medicine)  CHIEF COMPLAINT: Iron deficiency anemia.  INTERVAL HISTORY: Patient returns to clinic today for repeat laboratory work and further evaluation. He recently was on oral iron, but could not tolerate it secondary to constipation. Currently, his only complaint is of bilateral knee pain. He otherwise feels well and is asymptomatic. He does complain of weakness and fatigue. He has no neurologic complaints. He denies any recent fevers or illnesses. He has no chest pain or shortness of breath. He denies any nausea, vomiting, constipation, or diarrhea. He has no melena or hematochezia. He has no urinary complaints. Patient otherwise feels well and offers no further specific complaints.  REVIEW OF SYSTEMS:   Review of Systems  Constitutional: Negative for fever, malaise/fatigue and weight loss.  Respiratory: Negative.  Negative for cough and shortness of breath.   Cardiovascular: Negative.  Negative for chest pain and leg swelling.  Gastrointestinal: Negative.  Negative for abdominal pain, blood in stool and melena.  Genitourinary: Negative.   Musculoskeletal: Positive for joint pain.  Neurological: Negative.  Negative for weakness.  Psychiatric/Behavioral: Negative.  The patient is not nervous/anxious.     As per HPI. Otherwise, a complete review of systems is negative.  PAST MEDICAL HISTORY: Past Medical History:  Diagnosis Date  . Arthritis   . Atrial fibrillation ([place])   . Blood clotting tendency ([place])   . Chronic kidney disease   . DVT (deep venous thrombosis) ([place])   . Hypertension   . Melanoma ([place])   . Pacemaker     PAST SURGICAL HISTORY: Past Surgical History:  Procedure Laterality Date  . CHOLECYSTECTOMY    . COLONOSCOPY WITH PROPOFOL N/A 10/15/2014   Procedure: COLONOSCOPY WITH PROPOFOL;  Surgeon: Howard Class, MD;  Location: [facility] ENDOSCOPY;  Service: Endoscopy;  Laterality: N/A;  . INSERT / REPLACE / REMOVE PACEMAKER      FAMILY HISTORY: Family History  Problem Relation Age of Onset  . CAD Father   . Prostate cancer Brother   . Breast cancer Sister     ADVANCED DIRECTIVES (Y/N):  N  HEALTH MAINTENANCE: Social History  Substance Use Topics  . Smoking status: Never Smoker  . Smokeless tobacco: Never Used  . Alcohol use No     Colonoscopy:  PAP:  Bone density:  Lipid panel:  No Known Allergies  Current Outpatient Prescriptions  Medication Sig Dispense Refill  . aspirin EC 81 MG tablet Take 81 mg by mouth daily.    . Cholecalciferol (VITAMIN D3 PO) Take 1 tablet by mouth daily.    Marland Kitchen docusate sodium (COLACE) 100 MG capsule Take 100-200 mg by mouth daily.    . Iron-Vitamin C 65-125 MG TABS Take 1-2 tablets by mouth daily.    Marland Kitchen LACTASE ENZYME PO Take 1 tablet by mouth as needed.    . OMEGA-3 FATTY ACIDS PO Take 1-2 capsules by mouth daily.    . RABEprazole (ACIPHEX) 20 MG tablet Take 20 mg by mouth daily.     Howard Harmon LEAVES PO Take 470 mg by mouth daily.    . traMADol (ULTRAM) 50 MG tablet Take 100 mg by mouth 3 (three) times daily as needed for moderate pain.    . vitamin B-12 (CYANOCOBALAMIN) 1000 MCG tablet Take 1,000 mcg by mouth daily.     No current facility-administered  medications for this visit.     OBJECTIVE: Vitals:   05/31/16 1408  BP: 121/65  Pulse: (!) 54  Resp: 18  Temp: 98.1 F (36.7 C)     There is no height or weight on file to calculate BMI.    ECOG FS:1 - Symptomatic but completely ambulatory  General: Well-developed, well-nourished, no acute distress. Eyes: Pink conjunctiva, anicteric sclera. Lungs: Clear to auscultation bilaterally. Heart: Regular rate and rhythm. No rubs, murmurs, or gallops. Abdomen: Soft, nontender, nondistended. No organomegaly noted, normoactive bowel  sounds. Musculoskeletal: No edema, cyanosis, or clubbing. Neuro: Alert, answering all questions appropriately. Cranial nerves grossly intact. Skin: No rashes or petechiae noted. Psych: Normal affect.   LAB RESULTS:  Lab Results  Component Value Date   NA 140 01/02/2015   K 4.6 01/02/2015   CL 107 01/02/2015   CO2 27 01/02/2015   GLUCOSE 89 01/02/2015   BUN 19 01/02/2015   CREATININE 1.60 (H) 11/09/2015   CALCIUM 8.9 01/02/2015   PROT 6.5 01/01/2015   ALBUMIN 3.8 01/01/2015   AST 24 01/01/2015   ALT 15 (L) 01/01/2015   ALKPHOS 62 01/01/2015   BILITOT 0.7 01/01/2015   GFRNONAA 46 (L) 01/02/2015   GFRAA 54 (L) 01/02/2015    Lab Results  Component Value Date   WBC 8.8 05/31/2016   NEUTROABS 6.3 05/31/2016   HGB 12.6 (L) 05/31/2016   HCT 37.8 (L) 05/31/2016   MCV 97.4 05/31/2016   PLT 135 (L) 05/31/2016   Lab Results  Component Value Date   IRON 86 05/31/2016   TIBC 357 05/31/2016   IRONPCTSAT 24 05/31/2016   Lab Results  Component Value Date   FERRITIN 33 05/31/2016     STUDIES: No results found.  ASSESSMENT: Iron deficiency anemia  PLAN:   1. Iron deficiency anemia: Patient reports he cannot tolerate oral iron supplementation. Patient's iron stores and hemoglobin are signifcantly better then his last visit. The remainder of his laboratory work is either negative or within normal limits. Patient will not require IV iron today. Patient will then return to clinic in 3 months for labs, MD and consideration of feraheme. Previously, colonoscopy in May 2016 did not reveal any significant pathology. Patient's last EGD was in November 2012. 2. Thrombocytopenia: Mild, monitor. 3. Chronic renal insufficiency: Patient's creatinine appears his baseline, monitor.  Patient expressed understanding and was in agreement with this plan. He also understands that He can call clinic at any time with any questions, concerns, or complaints.   Howard Casa, NP  05/31/2016  Patient was seen and evaluated independently and I agree with the assessment and plan as dictated above. Patient does not require additional IV Feraheme today. He last received treatment in October 2017.  Howard Huger, MD 06/02/16 12:22 PM

## 2016-05-31 ENCOUNTER — Other Ambulatory Visit: Payer: Self-pay

## 2016-05-31 ENCOUNTER — Inpatient Hospital Stay: Payer: Medicare Other | Attending: Oncology | Admitting: Oncology

## 2016-05-31 ENCOUNTER — Inpatient Hospital Stay: Payer: Medicare Other

## 2016-05-31 VITALS — BP 121/65 | HR 54 | Temp 98.1°F | Resp 18

## 2016-05-31 DIAGNOSIS — Z950 Presence of cardiac pacemaker: Secondary | ICD-10-CM | POA: Diagnosis not present

## 2016-05-31 DIAGNOSIS — I4891 Unspecified atrial fibrillation: Secondary | ICD-10-CM | POA: Insufficient documentation

## 2016-05-31 DIAGNOSIS — Z85820 Personal history of malignant melanoma of skin: Secondary | ICD-10-CM | POA: Diagnosis not present

## 2016-05-31 DIAGNOSIS — Z7982 Long term (current) use of aspirin: Secondary | ICD-10-CM | POA: Diagnosis not present

## 2016-05-31 DIAGNOSIS — D509 Iron deficiency anemia, unspecified: Secondary | ICD-10-CM

## 2016-05-31 DIAGNOSIS — M25562 Pain in left knee: Secondary | ICD-10-CM | POA: Insufficient documentation

## 2016-05-31 DIAGNOSIS — D696 Thrombocytopenia, unspecified: Secondary | ICD-10-CM | POA: Diagnosis not present

## 2016-05-31 DIAGNOSIS — N189 Chronic kidney disease, unspecified: Secondary | ICD-10-CM | POA: Diagnosis not present

## 2016-05-31 DIAGNOSIS — Z8042 Family history of malignant neoplasm of prostate: Secondary | ICD-10-CM | POA: Diagnosis not present

## 2016-05-31 DIAGNOSIS — M25561 Pain in right knee: Secondary | ICD-10-CM | POA: Insufficient documentation

## 2016-05-31 DIAGNOSIS — Z79899 Other long term (current) drug therapy: Secondary | ICD-10-CM | POA: Insufficient documentation

## 2016-05-31 DIAGNOSIS — I129 Hypertensive chronic kidney disease with stage 1 through stage 4 chronic kidney disease, or unspecified chronic kidney disease: Secondary | ICD-10-CM | POA: Diagnosis not present

## 2016-05-31 DIAGNOSIS — Z803 Family history of malignant neoplasm of breast: Secondary | ICD-10-CM

## 2016-05-31 LAB — CBC WITH DIFFERENTIAL/PLATELET
BASOS ABS: 0.3 10*3/uL — AB (ref 0–0.1)
BASOS PCT: 3 %
Eosinophils Absolute: 0.3 10*3/uL (ref 0–0.7)
Eosinophils Relative: 3 %
HEMATOCRIT: 37.8 % — AB (ref 40.0–52.0)
Hemoglobin: 12.6 g/dL — ABNORMAL LOW (ref 13.0–18.0)
LYMPHS PCT: 15 %
Lymphs Abs: 1.3 10*3/uL (ref 1.0–3.6)
MCH: 32.5 pg (ref 26.0–34.0)
MCHC: 33.4 g/dL (ref 32.0–36.0)
MCV: 97.4 fL (ref 80.0–100.0)
MONO ABS: 0.7 10*3/uL (ref 0.2–1.0)
Monocytes Relative: 8 %
NEUTROS ABS: 6.3 10*3/uL (ref 1.4–6.5)
Neutrophils Relative %: 71 %
PLATELETS: 135 10*3/uL — AB (ref 150–440)
RBC: 3.88 MIL/uL — AB (ref 4.40–5.90)
RDW: 19.5 % — AB (ref 11.5–14.5)
WBC: 8.8 10*3/uL (ref 3.8–10.6)

## 2016-05-31 LAB — FERRITIN: Ferritin: 33 ng/mL (ref 24–336)

## 2016-05-31 LAB — IRON AND TIBC
Iron: 86 ug/dL (ref 45–182)
SATURATION RATIOS: 24 % (ref 17.9–39.5)
TIBC: 357 ug/dL (ref 250–450)
UIBC: 271 ug/dL

## 2016-05-31 NOTE — Progress Notes (Signed)
Complains of bilateral knee pain.

## 2016-06-14 DIAGNOSIS — Z86718 Personal history of other venous thrombosis and embolism: Secondary | ICD-10-CM | POA: Insufficient documentation

## 2016-08-29 ENCOUNTER — Other Ambulatory Visit: Payer: Self-pay

## 2016-08-29 DIAGNOSIS — D509 Iron deficiency anemia, unspecified: Secondary | ICD-10-CM

## 2016-08-29 NOTE — Progress Notes (Signed)
[place]  Telephone:(336) [phone] Fax:(336) [phone]  ID: Howard Harmon OB: 1929-10-29  MR#: [id number]  ZJQ#:[id number]  Patient Care Team: Rusty Aus, MD as PCP - General (Internal Medicine)  CHIEF COMPLAINT: Iron deficiency anemia.  INTERVAL HISTORY: Patient returns to clinic today for repeat laboratory work and further evaluation. He continues to have joint pain, but otherwise feels well and is asymptomatic. He does complain of weakness and fatigue. He has no neurologic complaints. He denies any recent fevers or illnesses. He has no chest pain or shortness of breath. He denies any nausea, vomiting, constipation, or diarrhea. He has no melena or hematochezia. He has no urinary complaints. Patient offers no further specific complaints today.  REVIEW OF SYSTEMS:   Review of Systems  Constitutional: Negative for fever, malaise/fatigue and weight loss.  Respiratory: Negative.  Negative for cough and shortness of breath.   Cardiovascular: Negative.  Negative for chest pain and leg swelling.  Gastrointestinal: Negative.  Negative for abdominal pain, blood in stool and melena.  Genitourinary: Negative.   Musculoskeletal: Positive for joint pain.  Neurological: Negative.  Negative for weakness.  Psychiatric/Behavioral: Negative.  The patient is not nervous/anxious.     As per HPI. Otherwise, a complete review of systems is negative.  PAST MEDICAL HISTORY: Past Medical History:  Diagnosis Date  . Arthritis   . Atrial fibrillation ([place])   . Blood clotting tendency ([place])   . Chronic kidney disease   . DVT (deep venous thrombosis) ([place])   . Hypertension   . Melanoma ([place])   . Pacemaker     PAST SURGICAL HISTORY: Past Surgical History:  Procedure Laterality Date  . CHOLECYSTECTOMY    . COLONOSCOPY WITH PROPOFOL N/A 10/15/2014   Procedure: COLONOSCOPY WITH PROPOFOL;  Surgeon: Josefine Class, MD;  Location: [facility] ENDOSCOPY;  Service: Endoscopy;   Laterality: N/A;  . INSERT / REPLACE / REMOVE PACEMAKER      FAMILY HISTORY: Family History  Problem Relation Age of Onset  . CAD Father   . Prostate cancer Brother   . Breast cancer Sister     ADVANCED DIRECTIVES (Y/N):  N  HEALTH MAINTENANCE: Social History  Substance Use Topics  . Smoking status: Never Smoker  . Smokeless tobacco: Never Used  . Alcohol use No     Colonoscopy:  PAP:  Bone density:  Lipid panel:  No Known Allergies  Current Outpatient Prescriptions  Medication Sig Dispense Refill  . aspirin EC 81 MG tablet Take 81 mg by mouth daily.    . Cholecalciferol (VITAMIN D3 PO) Take 1 tablet by mouth daily.    Marland Kitchen docusate sodium (COLACE) 100 MG capsule Take 100-200 mg by mouth daily.    . Iron-Vitamin C 65-125 MG TABS Take 1-2 tablets by mouth daily.    Marland Kitchen LACTASE ENZYME PO Take 1 tablet by mouth as needed.    . OMEGA-3 FATTY ACIDS PO Take 1-2 capsules by mouth daily.    . RABEprazole (ACIPHEX) 20 MG tablet Take 20 mg by mouth daily.     Jordan Hawks LEAVES PO Take 470 mg by mouth daily.    . traMADol (ULTRAM) 50 MG tablet Take 100 mg by mouth 3 (three) times daily as needed for moderate pain.    . vitamin B-12 (CYANOCOBALAMIN) 1000 MCG tablet Take 1,000 mcg by mouth daily.     No current facility-administered medications for this visit.     OBJECTIVE: Vitals:   08/31/16 1433  BP: (!) 93/56  Pulse: 75  Temp: 98.5 F (36.9 C)     Body mass index is 25.52 kg/m.    ECOG FS:1 - Symptomatic but completely ambulatory  General: Well-developed, well-nourished, no acute distress. Eyes: Pink conjunctiva, anicteric sclera. Lungs: Clear to auscultation bilaterally. Heart: Regular rate and rhythm. No rubs, murmurs, or gallops. Abdomen: Soft, nontender, nondistended. No organomegaly noted, normoactive bowel sounds. Musculoskeletal: No edema, cyanosis, or clubbing. Neuro: Alert, answering all questions appropriately. Cranial nerves grossly intact. Skin: No rashes or  petechiae noted. Psych: Normal affect.   LAB RESULTS:  Lab Results  Component Value Date   NA 140 01/02/2015   K 4.6 01/02/2015   CL 107 01/02/2015   CO2 27 01/02/2015   GLUCOSE 89 01/02/2015   BUN 19 01/02/2015   CREATININE 1.60 (H) 11/09/2015   CALCIUM 8.9 01/02/2015   PROT 6.5 01/01/2015   ALBUMIN 3.8 01/01/2015   AST 24 01/01/2015   ALT 15 (L) 01/01/2015   ALKPHOS 62 01/01/2015   BILITOT 0.7 01/01/2015   GFRNONAA 46 (L) 01/02/2015   GFRAA 54 (L) 01/02/2015    Lab Results  Component Value Date   WBC 8.8 08/31/2016   NEUTROABS 5.4 08/31/2016   HGB 12.7 (L) 08/31/2016   HCT 37.6 (L) 08/31/2016   MCV 101.4 (H) 08/31/2016   PLT 145 (L) 08/31/2016   Lab Results  Component Value Date   IRON 88 08/31/2016   TIBC 354 08/31/2016   IRONPCTSAT 25 08/31/2016   Lab Results  Component Value Date   FERRITIN 25 08/31/2016     STUDIES: No results found.  ASSESSMENT: Iron deficiency anemia  PLAN:   1. Iron deficiency anemia: Patient reports he cannot tolerate oral iron supplementation. Patient'sHemoglobin and iron stores are within normal limits. Previously, the remainder of his laboratory work is either negative or within normal limits. Patient will not require IV iron today. He last received IV iron on March 07, 2016. Return to clinic in 4 months for repeat laboratory work and further evaluation. Colonoscopy in May 2016 did not reveal any significant pathology. Patient's last EGD was in November 2012. 2. Thrombocytopenia: Mild, monitor. 3. Chronic renal insufficiency: Patient's creatinine appears his baseline, monitor. 4. Elevated MCV: Mild, monitor. Previously, B-12 and folate were within normal limits.  Patient expressed understanding and was in agreement with this plan. He also understands that He can call clinic at any time with any questions, concerns, or complaints.    Lloyd Huger, MD 09/05/16 9:58 PM

## 2016-08-31 ENCOUNTER — Inpatient Hospital Stay: Payer: Medicare Other

## 2016-08-31 ENCOUNTER — Inpatient Hospital Stay (HOSPITAL_BASED_OUTPATIENT_CLINIC_OR_DEPARTMENT_OTHER): Payer: Medicare Other | Admitting: Oncology

## 2016-08-31 ENCOUNTER — Inpatient Hospital Stay: Payer: Medicare Other | Attending: Oncology

## 2016-08-31 ENCOUNTER — Encounter: Payer: Self-pay | Admitting: Oncology

## 2016-08-31 VITALS — BP 93/56 | HR 75 | Temp 98.5°F | Wt 183.0 lb

## 2016-08-31 DIAGNOSIS — Z79899 Other long term (current) drug therapy: Secondary | ICD-10-CM

## 2016-08-31 DIAGNOSIS — Z85820 Personal history of malignant melanoma of skin: Secondary | ICD-10-CM

## 2016-08-31 DIAGNOSIS — I129 Hypertensive chronic kidney disease with stage 1 through stage 4 chronic kidney disease, or unspecified chronic kidney disease: Secondary | ICD-10-CM | POA: Insufficient documentation

## 2016-08-31 DIAGNOSIS — Z803 Family history of malignant neoplasm of breast: Secondary | ICD-10-CM

## 2016-08-31 DIAGNOSIS — Z950 Presence of cardiac pacemaker: Secondary | ICD-10-CM | POA: Insufficient documentation

## 2016-08-31 DIAGNOSIS — N189 Chronic kidney disease, unspecified: Secondary | ICD-10-CM | POA: Diagnosis not present

## 2016-08-31 DIAGNOSIS — Z7982 Long term (current) use of aspirin: Secondary | ICD-10-CM | POA: Insufficient documentation

## 2016-08-31 DIAGNOSIS — R5383 Other fatigue: Secondary | ICD-10-CM | POA: Diagnosis not present

## 2016-08-31 DIAGNOSIS — I4891 Unspecified atrial fibrillation: Secondary | ICD-10-CM | POA: Insufficient documentation

## 2016-08-31 DIAGNOSIS — R531 Weakness: Secondary | ICD-10-CM | POA: Diagnosis not present

## 2016-08-31 DIAGNOSIS — D509 Iron deficiency anemia, unspecified: Secondary | ICD-10-CM

## 2016-08-31 DIAGNOSIS — R7989 Other specified abnormal findings of blood chemistry: Secondary | ICD-10-CM | POA: Diagnosis not present

## 2016-08-31 DIAGNOSIS — D696 Thrombocytopenia, unspecified: Secondary | ICD-10-CM

## 2016-08-31 DIAGNOSIS — Z8042 Family history of malignant neoplasm of prostate: Secondary | ICD-10-CM | POA: Insufficient documentation

## 2016-08-31 DIAGNOSIS — M2550 Pain in unspecified joint: Secondary | ICD-10-CM

## 2016-08-31 DIAGNOSIS — Z86718 Personal history of other venous thrombosis and embolism: Secondary | ICD-10-CM | POA: Insufficient documentation

## 2016-08-31 DIAGNOSIS — I495 Sick sinus syndrome: Secondary | ICD-10-CM | POA: Insufficient documentation

## 2016-08-31 LAB — CBC WITH DIFFERENTIAL/PLATELET
BASOS ABS: 0 10*3/uL (ref 0–0.1)
BASOS PCT: 0 %
Eosinophils Absolute: 0.3 10*3/uL (ref 0–0.7)
Eosinophils Relative: 4 %
HEMATOCRIT: 37.6 % — AB (ref 40.0–52.0)
Hemoglobin: 12.7 g/dL — ABNORMAL LOW (ref 13.0–18.0)
LYMPHS PCT: 26 %
Lymphs Abs: 2.3 10*3/uL (ref 1.0–3.6)
MCH: 34.1 pg — ABNORMAL HIGH (ref 26.0–34.0)
MCHC: 33.6 g/dL (ref 32.0–36.0)
MCV: 101.4 fL — AB (ref 80.0–100.0)
MONO ABS: 0.8 10*3/uL (ref 0.2–1.0)
Monocytes Relative: 9 %
Neutro Abs: 5.4 10*3/uL (ref 1.4–6.5)
Neutrophils Relative %: 61 %
Platelets: 145 10*3/uL — ABNORMAL LOW (ref 150–440)
RBC: 3.71 MIL/uL — AB (ref 4.40–5.90)
RDW: 14.6 % — AB (ref 11.5–14.5)
WBC: 8.8 10*3/uL (ref 3.8–10.6)

## 2016-08-31 LAB — IRON AND TIBC
Iron: 88 ug/dL (ref 45–182)
SATURATION RATIOS: 25 % (ref 17.9–39.5)
TIBC: 354 ug/dL (ref 250–450)
UIBC: 266 ug/dL

## 2016-08-31 LAB — FERRITIN: FERRITIN: 25 ng/mL (ref 24–336)

## 2016-12-12 ENCOUNTER — Emergency Department: Payer: Medicare Other

## 2016-12-12 ENCOUNTER — Encounter: Payer: Self-pay | Admitting: Emergency Medicine

## 2016-12-12 ENCOUNTER — Emergency Department
Admission: EM | Admit: 2016-12-12 | Discharge: 2016-12-12 | Disposition: A | Discharge status: Home / Self Care | Payer: Medicare Other | Attending: Emergency Medicine | Admitting: Emergency Medicine

## 2016-12-12 DIAGNOSIS — Z79899 Other long term (current) drug therapy: Secondary | ICD-10-CM | POA: Diagnosis not present

## 2016-12-12 DIAGNOSIS — R05 Cough: Secondary | ICD-10-CM | POA: Diagnosis present

## 2016-12-12 DIAGNOSIS — Z950 Presence of cardiac pacemaker: Secondary | ICD-10-CM | POA: Insufficient documentation

## 2016-12-12 DIAGNOSIS — N183 Chronic kidney disease, stage 3 (moderate): Secondary | ICD-10-CM | POA: Diagnosis not present

## 2016-12-12 DIAGNOSIS — R042 Hemoptysis: Secondary | ICD-10-CM | POA: Diagnosis not present

## 2016-12-12 DIAGNOSIS — Z7982 Long term (current) use of aspirin: Secondary | ICD-10-CM | POA: Diagnosis not present

## 2016-12-12 DIAGNOSIS — I129 Hypertensive chronic kidney disease with stage 1 through stage 4 chronic kidney disease, or unspecified chronic kidney disease: Secondary | ICD-10-CM | POA: Insufficient documentation

## 2016-12-12 LAB — CBC
HCT: 36.9 % — ABNORMAL LOW (ref 40.0–52.0)
Hemoglobin: 12.7 g/dL — ABNORMAL LOW (ref 13.0–18.0)
MCH: 34.4 pg — ABNORMAL HIGH (ref 26.0–34.0)
MCHC: 34.3 g/dL (ref 32.0–36.0)
MCV: 100.3 fL — ABNORMAL HIGH (ref 80.0–100.0)
Platelets: 152 10*3/uL (ref 150–440)
RBC: 3.68 MIL/uL — ABNORMAL LOW (ref 4.40–5.90)
RDW: 15.6 % — AB (ref 11.5–14.5)
WBC: 7.8 10*3/uL (ref 3.8–10.6)

## 2016-12-12 LAB — BASIC METABOLIC PANEL
ANION GAP: 8 (ref 5–15)
BUN: 24 mg/dL — AB (ref 6–20)
CALCIUM: 9.6 mg/dL (ref 8.9–10.3)
CO2: 26 mmol/L (ref 22–32)
Chloride: 107 mmol/L (ref 101–111)
Creatinine, Ser: 1.58 mg/dL — ABNORMAL HIGH (ref 0.61–1.24)
GFR calc Af Amer: 44 mL/min — ABNORMAL LOW (ref >60)
GFR, EST NON AFRICAN AMERICAN: 38 mL/min — AB (ref >60)
GLUCOSE: 102 mg/dL — AB (ref 65–99)
POTASSIUM: 4.7 mmol/L (ref 3.5–5.1)
SODIUM: 141 mmol/L (ref 135–145)

## 2016-12-12 MED ORDER — IOPAMIDOL (ISOVUE-370) INJECTION 76%
75.0000 mL | Freq: Once | INTRAVENOUS | Status: AC | PRN
  Administered 2016-12-12: 60 mL via INTRAVENOUS

## 2016-12-12 NOTE — ED Triage Notes (Signed)
Cough dry recently. Today coughed up blood.  No pain.  Nad.  Normally wears oxygen at night at home

## 2016-12-12 NOTE — ED Notes (Signed)
ED Provider at bedside. 

## 2016-12-12 NOTE — ED Provider Notes (Signed)
[facility] Emergency Department Provider Note   ____________________________________________    I have reviewed the triage vital signs and the nursing notes.   HISTORY  Chief Complaint Hemoptysis     HPI Howard Harmon is a 83 y.o. male who presents with complaints of hemoptysis. Patient reports he has had an infrequent cough over the last day. Today he coughed and his daughter reports that blood was on the wall. Patient feels well has no shortness of breath. No pleurisy. He does have a history of DVTs but is unable to tolerate anticoagulation. No chest pain. No recent travel. No calf pain or swelling. No throat pain.   Past Medical History:  Diagnosis Date  . Arthritis   . Atrial fibrillation ([place])   . Blood clotting tendency ([place])   . Chronic kidney disease   . DVT (deep venous thrombosis) ([place])   . Hypertension   . Melanoma ([place])   . Pacemaker     Patient Active Problem List   Diagnosis Date Noted  . Atrial fibrillation ([place]) 08/31/2016  . Sinoatrial node dysfunction ([place]) 08/31/2016  . History of DVT of lower extremity 06/14/2016  . Aortic atherosclerosis ([place]) 11/10/2015  . Chronic respiratory failure with hypoxia ([place]) 11/09/2015  . Lumbar stenosis with neurogenic claudication 04/01/2015  . DDD (degenerative disc disease), lumbosacral 01/26/2015  . Rectal bleed 01/01/2015  . GIB (gastrointestinal bleeding) 10/12/2014  . Iron deficiency anemia 10/12/2014  . CKD (chronic kidney disease), stage III 10/12/2014  . 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  . HTN (hypertension) 11/07/2013  . OA (osteoarthritis) of knee 11/07/2013  . Peripheral sensory neuropathy 11/07/2013  . Malignant melanoma of skin of chest ([place]) 08/06/2013    Past Surgical History:  Procedure Laterality Date  . CHOLECYSTECTOMY    . COLONOSCOPY WITH PROPOFOL N/A 10/15/2014   Procedure: COLONOSCOPY WITH PROPOFOL;  Surgeon: Josefine Class, MD;  Location: [facility]  ENDOSCOPY;  Service: Endoscopy;  Laterality: N/A;  . INSERT / REPLACE / REMOVE PACEMAKER      Prior to Admission medications   Medication Sig Start Date End Date Taking? Authorizing Provider  aspirin EC 81 MG tablet Take 81 mg by mouth daily.    [provider]  Cholecalciferol (VITAMIN D3 PO) Take 1 tablet by mouth daily.    [provider]  docusate sodium (COLACE) 100 MG capsule Take 100-200 mg by mouth daily.    [provider]  Iron-Vitamin C 65-125 MG TABS Take 1-2 tablets by mouth daily.    [provider]  LACTASE ENZYME PO Take 1 tablet by mouth as needed.    [provider]  OMEGA-3 FATTY ACIDS PO Take 1-2 capsules by mouth daily.    [provider]  RABEprazole (ACIPHEX) 20 MG tablet Take 20 mg by mouth daily.     [provider]  SENNA LEAVES PO Take 470 mg by mouth daily.    [provider]  traMADol (ULTRAM) 50 MG tablet Take 100 mg by mouth 3 (three) times daily as needed for moderate pain.    [provider]  vitamin B-12 (CYANOCOBALAMIN) 1000 MCG tablet Take 1,000 mcg by mouth daily.    [provider]     Allergies Patient has no known allergies.  Family History  Problem Relation Age of Onset  . CAD Father   . Prostate cancer Brother   . Breast cancer Sister     Social History Social History  Substance Use Topics  . Smoking  status: Never Smoker  . Smokeless tobacco: Never Used  . Alcohol use No    Review of Systems  Constitutional: No fever/chills Eyes: No visual changes.  ENT: No sore throat. Cardiovascular: Denies chest pain. Respiratory: Denies shortness of breath.As above Gastrointestinal: No abdominal pain.  No nausea, no vomiting.   Genitourinary: Negative for dysuria. Musculoskeletal: Negative for back pain. Skin: Negative for rash. Neurological: Negative for headaches or weakness   ____________________________________________   PHYSICAL  EXAM:  VITAL SIGNS: ED Triage Vitals  Enc Vitals Group     BP 12/12/16 1134 132/60     Pulse Rate 12/12/16 1134 (!) 59     Resp 12/12/16 1134 16     Temp 12/12/16 1134 98.1 F (36.7 C)     Temp Source 12/12/16 1134 Oral     SpO2 12/12/16 1134 98 %     Weight 12/12/16 1136 81.6 kg (180 lb)     Height 12/12/16 1136 1.803 m (5\' 11" )     Head Circumference --      Peak Flow --      Pain Score 12/12/16 1135 0     Pain Loc --      Pain Edu? --      Excl. in [place]? --     Constitutional: Alert and oriented. No acute distress. Pleasant and interactive Eyes: Conjunctivae are normal.  Head: Atraumatic. Nose: No congestion/rhinnorhea. Dried blood in the right naris Mouth/Throat: Mucous membranes are moist.  Pharynx normal  Cardiovascular: Normal rate. Grossly normal heart sounds.  Good peripheral circulation. Respiratory: Normal respiratory effort.  No retractions. Lungs CTAB. Gastrointestinal: Soft and nontender. No distention.  No CVA tenderness. Genitourinary: deferred Musculoskeletal: No lower extremity tenderness nor edema.  Warm and well perfused Neurologic:  Normal speech and language. No gross focal neurologic deficits are appreciated.  Skin:  Skin is warm, dry and intact. No rash noted. Psychiatric: Mood and affect are normal. Speech and behavior are normal.  ____________________________________________   LABS (all labs ordered are listed, but only abnormal results are displayed)  Labs Reviewed  BASIC METABOLIC PANEL - Abnormal; Notable for the following:       Result Value   Glucose, Bld 102 (*)    BUN 24 (*)    Creatinine, Ser 1.58 (*)    GFR calc non Af Amer 38 (*)    GFR calc Af Amer 44 (*)    All other components within normal limits  CBC - Abnormal; Notable for the following:    RBC 3.68 (*)    Hemoglobin 12.7 (*)    HCT 36.9 (*)    MCV 100.3 (*)    MCH 34.4 (*)    RDW 15.6 (*)    All other components within normal limits    ____________________________________________  EKG  ED ECG REPORT I, Lavonia Drafts, the attending physician, personally viewed and interpreted this ECG.  Date: 12/12/2016  Rate: 60 Rhythm: Ventricular paced rhythm QRS Axis: normal Intervals: normal ST/T Wave abnormalities: normal  ____________________________________________  RADIOLOGY  Chest x-ray unremarkable CT angiography negative for PE or pneumonia ____________________________________________   PROCEDURES  Procedure(s) performed: No    Critical Care performed: No ____________________________________________   INITIAL IMPRESSION / ASSESSMENT AND PLAN / ED COURSE  Pertinent labs & imaging results that were available during my care of the patient were reviewed by me and considered in my medical decision making (see chart for details).  Patient with negative CTA. I suspect hemoptysis was related to epistaxis running down  his throat although he did not realize this was occurring. Discussed this with patient and daughter and they are comfortable going home with strict return precautions.    ____________________________________________   FINAL CLINICAL IMPRESSION(S) / ED DIAGNOSES  Final diagnoses:  Hemoptysis      NEW MEDICATIONS STARTED DURING THIS VISIT:  Discharge Medication List as of 12/12/2016  3:18 PM       Note:  This document was prepared using Dragon voice recognition software and may include unintentional dictation errors.    Lavonia Drafts, MD 12/12/16 [phone]

## 2016-12-12 NOTE — ED Notes (Signed)
Pt states coughed up blood this am, describes tissues with blood and a blood clot, denies persistant cough . Pt hx of blood clots, denies blood thinner use. Pt A&OX4, denies any pain or symptoms at this time

## 2016-12-27 NOTE — Progress Notes (Signed)
[place]  Telephone:(336) [phone] Fax:(336) [phone]  ID: Billie Lade OB: [birth year]  MR#: [id number]  TIR#:[id number]  Patient Care Team: Rusty Aus, MD as PCP - General (Internal Medicine)  CHIEF COMPLAINT: Iron deficiency anemia.  INTERVAL HISTORY: Patient returns to clinic today for repeat laboratory work and further evaluation. He continues to have joint pain. He continues to have chronic fatigue. He has no neurologic complaints. He denies any recent fevers or illnesses. He has no chest pain or shortness of breath. He denies any nausea, vomiting, constipation, or diarrhea. He has no melena or hematochezia. He has no urinary complaints. Patient offers no further specific complaints today.  REVIEW OF SYSTEMS:   Review of Systems  Constitutional: Positive for malaise/fatigue. Negative for fever and weight loss.  Respiratory: Negative.  Negative for cough and shortness of breath.   Cardiovascular: Negative.  Negative for chest pain and leg swelling.  Gastrointestinal: Negative.  Negative for abdominal pain, blood in stool and melena.  Genitourinary: Negative.   Musculoskeletal: Positive for joint pain.  Skin: Negative.  Negative for rash.  Neurological: Negative.  Negative for weakness.  Psychiatric/Behavioral: Negative.  The patient is not nervous/anxious.     As per HPI. Otherwise, a complete review of systems is negative.  PAST MEDICAL HISTORY: Past Medical History:  Diagnosis Date  . Arthritis   . Atrial fibrillation ([place])   . Blood clotting tendency ([place])   . Chronic kidney disease   . DVT (deep venous thrombosis) ([place])   . Hypertension   . Melanoma ([place])   . Pacemaker     PAST SURGICAL HISTORY: Past Surgical History:  Procedure Laterality Date  . CHOLECYSTECTOMY    . COLONOSCOPY WITH PROPOFOL N/A 10/15/2014   Procedure: COLONOSCOPY WITH PROPOFOL;  Surgeon: Josefine Class, MD;  Location: [facility] ENDOSCOPY;  Service: Endoscopy;   Laterality: N/A;  . INSERT / REPLACE / REMOVE PACEMAKER      FAMILY HISTORY: Family History  Problem Relation Age of Onset  . CAD Father   . Prostate cancer Brother   . Breast cancer Sister     ADVANCED DIRECTIVES (Y/N):  N  HEALTH MAINTENANCE: Social History  Substance Use Topics  . Smoking status: Never Smoker  . Smokeless tobacco: Never Used  . Alcohol use No     Colonoscopy:  PAP:  Bone density:  Lipid panel:  No Known Allergies  Current Outpatient Prescriptions  Medication Sig Dispense Refill  . aspirin EC 81 MG tablet Take 81 mg by mouth daily.    . Cholecalciferol (VITAMIN D3 PO) Take 1 tablet by mouth daily.    Marland Kitchen docusate sodium (COLACE) 100 MG capsule Take 100-200 mg by mouth daily.    . Iron-Vitamin C 65-125 MG TABS Take 1-2 tablets by mouth daily.    Marland Kitchen LACTASE ENZYME PO Take 1 tablet by mouth as needed.    . OMEGA-3 FATTY ACIDS PO Take 1-2 capsules by mouth daily.    . RABEprazole (ACIPHEX) 20 MG tablet Take 20 mg by mouth daily.     Jordan Hawks LEAVES PO Take 470 mg by mouth daily.    . traMADol (ULTRAM) 50 MG tablet Take 50 mg by mouth 2 (two) times daily.     . vitamin B-12 (CYANOCOBALAMIN) 1000 MCG tablet Take 1,000 mcg by mouth daily.     No current facility-administered medications for this visit.     OBJECTIVE: Vitals:   12/28/16 1347  BP: (!) 156/91  Pulse: 72  Resp: 20  Temp: (!) 97 F (36.1 C)     Body mass index is 25.09 kg/m.    ECOG FS:1 - Symptomatic but completely ambulatory  General: Well-developed, well-nourished, no acute distress. Eyes: Pink conjunctiva, anicteric sclera. Lungs: Clear to auscultation bilaterally. Heart: Regular rate and rhythm. No rubs, murmurs, or gallops. Abdomen: Soft, nontender, nondistended. No organomegaly noted, normoactive bowel sounds. Musculoskeletal: No edema, cyanosis, or clubbing. Neuro: Alert, answering all questions appropriately. Cranial nerves grossly intact. Skin: No rashes or petechiae  noted. Psych: Normal affect.   LAB RESULTS:  Lab Results  Component Value Date   NA 141 12/12/2016   K 4.7 12/12/2016   CL 107 12/12/2016   CO2 26 12/12/2016   GLUCOSE 102 (H) 12/12/2016   BUN 24 (H) 12/12/2016   CREATININE 1.58 (H) 12/12/2016   CALCIUM 9.6 12/12/2016   PROT 6.5 01/01/2015   ALBUMIN 3.8 01/01/2015   AST 24 01/01/2015   ALT 15 (L) 01/01/2015   ALKPHOS 62 01/01/2015   BILITOT 0.7 01/01/2015   GFRNONAA 38 (L) 12/12/2016   GFRAA 44 (L) 12/12/2016    Lab Results  Component Value Date   WBC 8.4 12/28/2016   NEUTROABS 5.6 12/28/2016   HGB 12.5 (L) 12/28/2016   HCT 36.5 (L) 12/28/2016   MCV 100.2 (H) 12/28/2016   PLT 139 (L) 12/28/2016   Lab Results  Component Value Date   IRON 63 12/28/2016   TIBC 355 12/28/2016   IRONPCTSAT 18 12/28/2016   Lab Results  Component Value Date   FERRITIN 24 12/28/2016     STUDIES: Dg Chest 2 View  Result Date: 12/12/2016 CLINICAL DATA:  Productive cough for 2 days EXAM: CHEST  2 VIEW COMPARISON:  Chest radiograph Oct 12, 2014 and chest CT November 09, 2015 FINDINGS: There is mild scarring in the left base. There is no appreciable edema or consolidation. Heart size is within normal limits. The pulmonary vascularity is normal. There is aortic tortuosity with aortic atherosclerosis. There is a pacemaker with lead tips in the right atrium and right ventricle. No pneumothorax. 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 There is degenerative change in the thoracic spine. IMPRESSION: Left base scarring. No edema or consolidation. Pacemaker leads attached to right atrium and right ventricle. Tortuous aorta. Aortic atherosclerosis. 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 Advanced arthropathy in both shoulders. Aortic Atherosclerosis (ICD10-I70.0). Electronically Signed   By: Lowella Grip III M.D.   On: 12/12/2016 12:10   Ct  Angio Chest Pe W And/or Wo Contrast  Result Date: 12/12/2016 CLINICAL DATA:  83 y/o  M; hemoptysis with history of PE. EXAM: CT ANGIOGRAPHY CHEST WITH CONTRAST TECHNIQUE: Multidetector CT imaging of the chest was performed using the standard protocol during bolus administration of intravenous contrast. Multiplanar CT image reconstructions and MIPs were obtained to evaluate the vascular anatomy. CONTRAST:  60 cc Isovue 370 COMPARISON:  11/09/2015 CT of the chest. FINDINGS: Cardiovascular: Satisfactory opacification of the pulmonary arteries to the segmental level. No evidence of pulmonary embolism. Ascending thoracic aorta measures up to 4.2 cm. Mild calcific atherosclerosis. Normal caliber main pulmonary artery. Mild cardiomegaly. Moderate coronary artery calcification. Mediastinum/Nodes: No enlarged mediastinal, hilar, or axillary lymph nodes. Thyroid gland, trachea, and esophagus demonstrate no significant findings. Lungs/Pleura: Stable 3 mm left upper lobe pulmonary nodule (6:41), likely benign. Stable linear opacity in left upper lobe along the major fissure, probably scarring,  series 6, image 34. Calcified granulomata. Minor atelectasis in the lung bases. Upper Abdomen: Cholecystectomy.  Right kidney nephrolithiasis. Musculoskeletal: No chest wall abnormality. No acute or significant osseous findings. Chronic right anterolateral rib fractures. Review of the MIP images confirms the above findings. IMPRESSION: 1. No acute pulmonary embolus identified. 2. Stable ascending thoracic aortic aneurysm measuring 4.2 cm. Recommend annual imaging followup by CTA or MRA. This recommendation follows 2010 ACCF/AHA/AATS/ACR/ASA/SCA/SCAI/SIR/STS/SVM Guidelines for the Diagnosis and Management of Patients with Thoracic Aortic Disease. Circulation. 2010; 121: Y590-B311. 3. Mild cardiomegaly and moderate coronary artery calcification. 4. No acute pulmonary process identified. 5. Right kidney nephrolithiasis. Electronically  Signed   By: Kristine Garbe M.D.   On: 12/12/2016 15:09    ASSESSMENT: Iron deficiency anemia  PLAN:   1. Iron deficiency anemia: Patient reports he cannot tolerate oral iron supplementation. Patient's hemoglobin and iron stores continue to be essentially within normal limits. Previously, the remainder of his laboratory work was either negative or within normal limits. Patient does not require IV iron today. He last received IV iron on March 07, 2016. Return to clinic in 4 months for repeat laboratory work and further evaluation. Colonoscopy in May 2016 did not reveal any significant pathology. Patient's last EGD was in November 2012. 2. Thrombocytopenia: Mild, monitor. 3. Chronic renal insufficiency: Patient's creatinine appears his baseline, monitor. 4. Elevated MCV: Mild, monitor. Previously, B-12 and folate were within normal limits. 5. Hypertension: Patient's blood pressure is mildly elevated today. Continue treatment and evaluation per PCP.  Patient expressed understanding and was in agreement with this plan. He also understands that He can call clinic at any time with any questions, concerns, or complaints.    Lloyd Huger, MD 12/29/16 9:13 PM

## 2016-12-28 ENCOUNTER — Inpatient Hospital Stay: Payer: Medicare Other

## 2016-12-28 ENCOUNTER — Inpatient Hospital Stay: Payer: Medicare Other | Attending: Oncology | Admitting: Oncology

## 2016-12-28 VITALS — BP 156/91 | HR 72 | Temp 97.0°F | Resp 20 | Wt 179.9 lb

## 2016-12-28 DIAGNOSIS — Z79899 Other long term (current) drug therapy: Secondary | ICD-10-CM | POA: Insufficient documentation

## 2016-12-28 DIAGNOSIS — R5382 Chronic fatigue, unspecified: Secondary | ICD-10-CM | POA: Diagnosis not present

## 2016-12-28 DIAGNOSIS — Z86711 Personal history of pulmonary embolism: Secondary | ICD-10-CM | POA: Insufficient documentation

## 2016-12-28 DIAGNOSIS — N186 End stage renal disease: Secondary | ICD-10-CM | POA: Diagnosis not present

## 2016-12-28 DIAGNOSIS — Z86718 Personal history of other venous thrombosis and embolism: Secondary | ICD-10-CM | POA: Insufficient documentation

## 2016-12-28 DIAGNOSIS — Z8042 Family history of malignant neoplasm of prostate: Secondary | ICD-10-CM | POA: Diagnosis not present

## 2016-12-28 DIAGNOSIS — Z803 Family history of malignant neoplasm of breast: Secondary | ICD-10-CM | POA: Diagnosis not present

## 2016-12-28 DIAGNOSIS — Z85820 Personal history of malignant melanoma of skin: Secondary | ICD-10-CM | POA: Diagnosis not present

## 2016-12-28 DIAGNOSIS — R918 Other nonspecific abnormal finding of lung field: Secondary | ICD-10-CM | POA: Diagnosis not present

## 2016-12-28 DIAGNOSIS — I712 Thoracic aortic aneurysm, without rupture: Secondary | ICD-10-CM | POA: Diagnosis not present

## 2016-12-28 DIAGNOSIS — I517 Cardiomegaly: Secondary | ICD-10-CM | POA: Diagnosis not present

## 2016-12-28 DIAGNOSIS — I129 Hypertensive chronic kidney disease with stage 1 through stage 4 chronic kidney disease, or unspecified chronic kidney disease: Secondary | ICD-10-CM | POA: Diagnosis not present

## 2016-12-28 DIAGNOSIS — I4891 Unspecified atrial fibrillation: Secondary | ICD-10-CM | POA: Diagnosis not present

## 2016-12-28 DIAGNOSIS — Z9049 Acquired absence of other specified parts of digestive tract: Secondary | ICD-10-CM | POA: Insufficient documentation

## 2016-12-28 DIAGNOSIS — M1990 Unspecified osteoarthritis, unspecified site: Secondary | ICD-10-CM

## 2016-12-28 DIAGNOSIS — Z950 Presence of cardiac pacemaker: Secondary | ICD-10-CM | POA: Diagnosis not present

## 2016-12-28 DIAGNOSIS — Z7982 Long term (current) use of aspirin: Secondary | ICD-10-CM | POA: Diagnosis not present

## 2016-12-28 DIAGNOSIS — N200 Calculus of kidney: Secondary | ICD-10-CM | POA: Insufficient documentation

## 2016-12-28 DIAGNOSIS — N189 Chronic kidney disease, unspecified: Secondary | ICD-10-CM | POA: Diagnosis not present

## 2016-12-28 DIAGNOSIS — R718 Other abnormality of red blood cells: Secondary | ICD-10-CM

## 2016-12-28 DIAGNOSIS — D509 Iron deficiency anemia, unspecified: Secondary | ICD-10-CM | POA: Diagnosis not present

## 2016-12-28 DIAGNOSIS — D696 Thrombocytopenia, unspecified: Secondary | ICD-10-CM | POA: Insufficient documentation

## 2016-12-28 DIAGNOSIS — R5381 Other malaise: Secondary | ICD-10-CM | POA: Diagnosis not present

## 2016-12-28 LAB — CBC WITH DIFFERENTIAL/PLATELET
BASOS ABS: 0 10*3/uL (ref 0–0.1)
Basophils Relative: 0 %
Eosinophils Absolute: 0.2 10*3/uL (ref 0–0.7)
Eosinophils Relative: 3 %
HEMATOCRIT: 36.5 % — AB (ref 40.0–52.0)
Hemoglobin: 12.5 g/dL — ABNORMAL LOW (ref 13.0–18.0)
Lymphocytes Relative: 21 %
Lymphs Abs: 1.7 10*3/uL (ref 1.0–3.6)
MCH: 34.5 pg — ABNORMAL HIGH (ref 26.0–34.0)
MCHC: 34.4 g/dL (ref 32.0–36.0)
MCV: 100.2 fL — ABNORMAL HIGH (ref 80.0–100.0)
MONO ABS: 0.8 10*3/uL (ref 0.2–1.0)
Monocytes Relative: 10 %
NEUTROS ABS: 5.6 10*3/uL (ref 1.4–6.5)
Neutrophils Relative %: 66 %
Platelets: 139 10*3/uL — ABNORMAL LOW (ref 150–440)
RBC: 3.64 MIL/uL — AB (ref 4.40–5.90)
RDW: 15.3 % — AB (ref 11.5–14.5)
WBC: 8.4 10*3/uL (ref 3.8–10.6)

## 2016-12-28 LAB — IRON AND TIBC
Iron: 63 ug/dL (ref 45–182)
SATURATION RATIOS: 18 % (ref 17.9–39.5)
TIBC: 355 ug/dL (ref 250–450)
UIBC: 292 ug/dL

## 2016-12-28 LAB — FERRITIN: Ferritin: 24 ng/mL (ref 24–336)

## 2016-12-28 NOTE — Progress Notes (Signed)
Patient reports feeling tired today.  

## 2017-01-04 ENCOUNTER — Ambulatory Visit: Payer: Medicare Other

## 2017-01-08 ENCOUNTER — Ambulatory Visit: Payer: Medicare Other | Admitting: Oncology

## 2017-01-08 ENCOUNTER — Ambulatory Visit: Payer: Medicare Other

## 2017-01-08 ENCOUNTER — Other Ambulatory Visit: Payer: Medicare Other

## 2017-05-03 ENCOUNTER — Inpatient Hospital Stay: Payer: Medicare Other

## 2017-05-03 ENCOUNTER — Inpatient Hospital Stay: Payer: Medicare Other | Admitting: Oncology

## 2017-09-25 ENCOUNTER — Other Ambulatory Visit: Payer: Self-pay | Admitting: Urgent Care

## 2017-09-25 ENCOUNTER — Encounter: Payer: Self-pay | Admitting: Oncology

## 2017-09-25 DIAGNOSIS — N183 Chronic kidney disease, stage 3 unspecified: Secondary | ICD-10-CM

## 2017-09-25 DIAGNOSIS — D509 Iron deficiency anemia, unspecified: Secondary | ICD-10-CM

## 2017-09-26 ENCOUNTER — Other Ambulatory Visit: Payer: Self-pay

## 2017-09-26 ENCOUNTER — Inpatient Hospital Stay: Payer: Medicare Other

## 2017-09-26 ENCOUNTER — Inpatient Hospital Stay: Payer: Medicare Other | Attending: Oncology | Admitting: Oncology

## 2017-09-26 VITALS — BP 127/71 | HR 70 | Temp 96.9°F | Resp 18 | Wt 184.8 lb

## 2017-09-26 DIAGNOSIS — N183 Chronic kidney disease, stage 3 unspecified: Secondary | ICD-10-CM

## 2017-09-26 DIAGNOSIS — R718 Other abnormality of red blood cells: Secondary | ICD-10-CM

## 2017-09-26 DIAGNOSIS — D649 Anemia, unspecified: Secondary | ICD-10-CM

## 2017-09-26 DIAGNOSIS — D696 Thrombocytopenia, unspecified: Secondary | ICD-10-CM | POA: Diagnosis not present

## 2017-09-26 DIAGNOSIS — D509 Iron deficiency anemia, unspecified: Secondary | ICD-10-CM

## 2017-09-26 DIAGNOSIS — I10 Essential (primary) hypertension: Secondary | ICD-10-CM

## 2017-09-26 LAB — CBC WITH DIFFERENTIAL/PLATELET
BASOS ABS: 0 10*3/uL (ref 0–0.1)
Basophils Relative: 0 %
Eosinophils Absolute: 0.1 10*3/uL (ref 0–0.7)
Eosinophils Relative: 1 %
HEMATOCRIT: 26.8 % — AB (ref 40.0–52.0)
HEMOGLOBIN: 9 g/dL — AB (ref 13.0–18.0)
LYMPHS PCT: 20 %
Lymphs Abs: 2 10*3/uL (ref 1.0–3.6)
MCH: 33.8 pg (ref 26.0–34.0)
MCHC: 33.7 g/dL (ref 32.0–36.0)
MCV: 100.3 fL — AB (ref 80.0–100.0)
MONO ABS: 0.9 10*3/uL (ref 0.2–1.0)
Monocytes Relative: 9 %
NEUTROS ABS: 7 10*3/uL — AB (ref 1.4–6.5)
NEUTROS PCT: 70 %
Platelets: 149 10*3/uL — ABNORMAL LOW (ref 150–440)
RBC: 2.67 MIL/uL — ABNORMAL LOW (ref 4.40–5.90)
RDW: 16.7 % — AB (ref 11.5–14.5)
WBC: 10.1 10*3/uL (ref 3.8–10.6)

## 2017-09-26 LAB — IRON AND TIBC
IRON: 48 ug/dL (ref 45–182)
Saturation Ratios: 13 % — ABNORMAL LOW (ref 17.9–39.5)
TIBC: 364 ug/dL (ref 250–450)
UIBC: 316 ug/dL

## 2017-09-26 LAB — FERRITIN: FERRITIN: 12 ng/mL — AB (ref 24–336)

## 2017-09-26 MED ORDER — SODIUM CHLORIDE 0.9 % IV SOLN
Freq: Once | INTRAVENOUS | Status: AC
  Administered 2017-09-26: 13:00:00 via INTRAVENOUS
  Filled 2017-09-26: qty 1000

## 2017-09-26 MED ORDER — SODIUM CHLORIDE 0.9 % IV SOLN
510.0000 mg | Freq: Once | INTRAVENOUS | Status: AC
  Administered 2017-09-26: 510 mg via INTRAVENOUS
  Filled 2017-09-26: qty 17

## 2017-09-26 NOTE — Progress Notes (Signed)
Here for follow up" doing well overall "

## 2017-09-27 ENCOUNTER — Encounter: Payer: Self-pay | Admitting: Gastroenterology

## 2017-09-28 NOTE — Progress Notes (Signed)
[place]  Telephone:(336) [phone] Fax:(336) [phone]  ID: Howard Harmon OB: 1929-12-11  MR#: [id number]  SWN#:[id number]  Patient Care Team: Rusty Aus, MD as PCP - General (Internal Medicine)  CHIEF COMPLAINT: Iron deficiency anemia.  INTERVAL HISTORY: Patient returns to clinic today for repeat laboratory work, further evaluation, and consideration of additional IV iron.  He continues to have chronic weakness and fatigue, but otherwise feels well. He has no neurologic complaints. He denies any recent fevers or illnesses. He has no chest pain or shortness of breath. He denies any nausea, vomiting, constipation, or diarrhea. He has no melena or hematochezia. He has no urinary complaints.  Patient offers no specific complaints today.  REVIEW OF SYSTEMS:   Review of Systems  Constitutional: Positive for malaise/fatigue. Negative for fever and weight loss.  Respiratory: Negative.  Negative for cough and shortness of breath.   Cardiovascular: Negative.  Negative for chest pain and leg swelling.  Gastrointestinal: Negative.  Negative for abdominal pain, blood in stool and melena.  Genitourinary: Negative.  Negative for hematuria.  Musculoskeletal: Positive for joint pain.  Skin: Negative.  Negative for rash.  Neurological: Positive for weakness. Negative for sensory change and focal weakness.  Psychiatric/Behavioral: Negative.  The patient is not nervous/anxious.     As per HPI. Otherwise, a complete review of systems is negative.  PAST MEDICAL HISTORY: Past Medical History:  Diagnosis Date  . Arthritis   . Atrial fibrillation ([place])   . Blood clotting tendency ([place])   . Chronic kidney disease   . DVT (deep venous thrombosis) ([place])   . Hypertension   . Melanoma ([place])   . Pacemaker     PAST SURGICAL HISTORY: Past Surgical History:  Procedure Laterality Date  . CHOLECYSTECTOMY    . COLONOSCOPY WITH PROPOFOL N/A 10/15/2014   Procedure: COLONOSCOPY WITH  PROPOFOL;  Surgeon: Josefine Class, MD;  Location: [facility] ENDOSCOPY;  Service: Endoscopy;  Laterality: N/A;  . INSERT / REPLACE / REMOVE PACEMAKER      FAMILY HISTORY: Family History  Problem Relation Age of Onset  . CAD Father   . Prostate cancer Brother   . Breast cancer Sister     ADVANCED DIRECTIVES (Y/N):  N  HEALTH MAINTENANCE: Social History   Tobacco Use  . Smoking status: Never Smoker  . Smokeless tobacco: Never Used  Substance Use Topics  . Alcohol use: No  . Drug use: Not on file     Colonoscopy:  PAP:  Bone density:  Lipid panel:  Allergies  Allergen Reactions  . Warfarin Other (See Comments)    Bleeding diff to stop    Current Outpatient Medications  Medication Sig Dispense Refill  . aspirin EC 81 MG tablet Take 81 mg by mouth daily.    . Cholecalciferol (VITAMIN D3 PO) Take 1 tablet by mouth daily.    Marland Kitchen docusate sodium (COLACE) 100 MG capsule Take 100-200 mg by mouth daily.    . furosemide (LASIX) 20 MG tablet Take by mouth.    Marland Kitchen LACTASE ENZYME PO Take 1 tablet by mouth as needed.    . OMEGA-3 FATTY ACIDS PO Take 1-2 capsules by mouth daily.    . RABEprazole (ACIPHEX) 20 MG tablet Take 20 mg by mouth daily.     Jordan Hawks LEAVES PO Take 470 mg by mouth daily.    . traMADol (ULTRAM) 50 MG tablet Take 50 mg by mouth 2 (two) times daily.     . vitamin B-12 (CYANOCOBALAMIN)  1000 MCG tablet Take 1,000 mcg by mouth daily.    . Iron-Vitamin C 65-125 MG TABS Take 1-2 tablets by mouth daily.     No current facility-administered medications for this visit.     OBJECTIVE: Vitals:   09/26/17 1220  BP: 127/71  Pulse: 70  Resp: 18  Temp: (!) 96.9 F (36.1 C)     Body mass index is 25.77 kg/m.    ECOG FS:1 - Symptomatic but completely ambulatory  General: Well-developed, well-nourished, no acute distress. Eyes: Pink conjunctiva, anicteric sclera. Lungs: Clear to auscultation bilaterally. Heart: Regular rate and rhythm. No rubs, murmurs, or  gallops. Abdomen: Soft, nontender, nondistended. No organomegaly noted, normoactive bowel sounds. Musculoskeletal: No edema, cyanosis, or clubbing. Neuro: Alert, answering all questions appropriately. Cranial nerves grossly intact. Skin: No rashes or petechiae noted. Psych: Normal affect.  LAB RESULTS:  Lab Results  Component Value Date   NA 141 12/12/2016   K 4.7 12/12/2016   CL 107 12/12/2016   CO2 26 12/12/2016   GLUCOSE 102 (H) 12/12/2016   BUN 24 (H) 12/12/2016   CREATININE 1.58 (H) 12/12/2016   CALCIUM 9.6 12/12/2016   PROT 6.5 01/01/2015   ALBUMIN 3.8 01/01/2015   AST 24 01/01/2015   ALT 15 (L) 01/01/2015   ALKPHOS 62 01/01/2015   BILITOT 0.7 01/01/2015   GFRNONAA 38 (L) 12/12/2016   GFRAA 44 (L) 12/12/2016    Lab Results  Component Value Date   WBC 10.1 09/26/2017   NEUTROABS 7.0 (H) 09/26/2017   HGB 9.0 (L) 09/26/2017   HCT 26.8 (L) 09/26/2017   MCV 100.3 (H) 09/26/2017   PLT 149 (L) 09/26/2017   Lab Results  Component Value Date   IRON 48 09/26/2017   TIBC 364 09/26/2017   IRONPCTSAT 13 (L) 09/26/2017   Lab Results  Component Value Date   FERRITIN 12 (L) 09/26/2017     STUDIES: No results found.  ASSESSMENT: Iron deficiency anemia  PLAN:   1. Iron deficiency anemia: Patient reports he cannot tolerate oral iron supplementation.  Patient's hemoglobin and iron stores have trended down. Previously, the remainder of his laboratory work was either negative or within normal limits.  Proceed with 510 mg IV Feraheme today.  Return to clinic in 1 week for a second infusion and then in 2 months with repeat laboratory work and further evaluation.  Patient is also been instructed to contact his GI doctor for further evaluation.  His last colonoscopy in May 2016 did not reveal any significant pathology. Patient's last EGD was in November 2012. 2. Thrombocytopenia: Chronic.  Mild, monitor. 3. Elevated MCV: Previously, B-12 and folate were within normal limits.   Given his iron deficiency anemia, he may have a component of MDS which would require a bone marrow biopsy to diagnose.  This is not necessary at this time.  Monitor. 4. Hypertension: Patient's blood pressure is within normal limits today.  Approximately 30 minutes was spent in discussion of which greater than 50% was consultation.   Patient expressed understanding and was in agreement with this plan. He also understands that He can call clinic at any time with any questions, concerns, or complaints.    Lloyd Huger, MD 09/28/17 3:45 PM

## 2017-09-30 ENCOUNTER — Encounter: Payer: Self-pay | Admitting: Gastroenterology

## 2017-10-02 ENCOUNTER — Inpatient Hospital Stay: Payer: Medicare Other

## 2017-10-02 ENCOUNTER — Ambulatory Visit: Payer: Medicare Other | Admitting: Oncology

## 2017-10-02 ENCOUNTER — Other Ambulatory Visit: Payer: Medicare Other

## 2017-10-02 ENCOUNTER — Ambulatory Visit: Payer: Medicare Other

## 2017-10-02 VITALS — BP 118/54 | HR 80 | Temp 97.9°F | Resp 18

## 2017-10-02 DIAGNOSIS — D509 Iron deficiency anemia, unspecified: Secondary | ICD-10-CM | POA: Diagnosis not present

## 2017-10-02 MED ORDER — SODIUM CHLORIDE 0.9 % IV SOLN
Freq: Once | INTRAVENOUS | Status: AC
  Administered 2017-10-02: 13:00:00 via INTRAVENOUS
  Filled 2017-10-02: qty 1000

## 2017-10-02 MED ORDER — SODIUM CHLORIDE 0.9 % IV SOLN
510.0000 mg | Freq: Once | INTRAVENOUS | Status: AC
  Administered 2017-10-02: 510 mg via INTRAVENOUS
  Filled 2017-10-02: qty 17

## 2017-10-02 NOTE — Progress Notes (Signed)
Primary Care Physician: Rusty Aus, MD  Primary Gastroenterologist:  Dr. Lucilla Lame  No chief complaint on file.   HPI: Howard Harmon is a 84 y.o. male here for follow-up of rectal bleeding.  The patient had been the hospital back in 2016 and at that time had rectal bleeding. The patient had a colonoscopy back in 2016 by Dr. Rayann Heman at the [facility].  The patient was found to have a few polyps and some diverticulosis but no active bleeding was seen.  The patient now has had a low iron and ferritin with a low hemoglobin.  The patient's daughter states that the patient is now concerned because he is having this rectal bleeding quite frequently.  The patient states that he had a large amount of rectal bleeding last week that lasted approximately 4 days.  That precipitated his daughter to contact us about being seen immediately.  The patient denies any abdominal pain nausea vomiting fevers or chills.  Current Outpatient Medications  Medication Sig Dispense Refill  . aspirin EC 81 MG tablet Take 81 mg by mouth daily.    . Cholecalciferol (VITAMIN D3 PO) Take 1 tablet by mouth daily.    Marland Kitchen docusate sodium (COLACE) 100 MG capsule Take 100-200 mg by mouth daily.    . furosemide (LASIX) 20 MG tablet Take by mouth.    . Iron-Vitamin C 65-125 MG TABS Take 1-2 tablets by mouth daily.    Marland Kitchen LACTASE ENZYME PO Take 1 tablet by mouth as needed.    . OMEGA-3 FATTY ACIDS PO Take 1-2 capsules by mouth daily.    . RABEprazole (ACIPHEX) 20 MG tablet Take 20 mg by mouth daily.     Jordan Hawks LEAVES PO Take 470 mg by mouth daily.    . traMADol (ULTRAM) 50 MG tablet Take 50 mg by mouth 2 (two) times daily.     . vitamin B-12 (CYANOCOBALAMIN) 1000 MCG tablet Take 1,000 mcg by mouth daily.     No current facility-administered medications for this visit.     Allergies as of 10/03/2017 - Review Complete 09/26/2017  Allergen Reaction Noted  . Warfarin Other (See Comments) 01/06/2015     ROS:  General: Negative for anorexia, weight loss, fever, chills, fatigue, weakness. ENT: Negative for hoarseness, difficulty swallowing , nasal congestion. CV: Negative for chest pain, angina, palpitations, dyspnea on exertion, peripheral edema.  Respiratory: Negative for dyspnea at rest, dyspnea on exertion, cough, sputum, wheezing.  GI: See history of present illness. GU:  Negative for dysuria, hematuria, urinary incontinence, urinary frequency, nocturnal urination.  Endo: Negative for unusual weight change.    Physical Examination:   There were no vitals taken for this visit.  General: Well-nourished, well-developed in no acute distress.  Eyes: No icterus. Conjunctivae pink. Mouth: Oropharyngeal mucosa moist and pink , no lesions erythema or exudate. Lungs: Clear to auscultation bilaterally. Non-labored. Heart: Regular rate and rhythm, no murmurs rubs or gallops.  Abdomen: Bowel sounds are normal, nontender, nondistended, no hepatosplenomegaly or masses, no abdominal bruits or hernia , no rebound or guarding.   Extremities: No lower extremity edema. No clubbing or deformities. Neuro: Alert and oriented x 3.  Grossly intact. Skin: Warm and dry, no jaundice.   Psych: Alert and cooperative, normal mood and affect.  Labs:    Imaging Studies: No results found.  Assessment and Plan:   Howard Harmon is a 84 y.o. y/o male who has a history of rectal bleeding and had a  colonoscopy in 2016 with diverticulosis found by another gastroenterologist.  The patient had an episode that he reports to be a large amount of bright red blood with clots last week that lasted 4 days until it cleared up.  He has had no further bleeding.  The patient is not sure if it is associated with constipation or straining but states he sometimes has to strain to have bowel movements.  The patient was offered a colonoscopy due to his recurrent lower GI bleeding but states that he would rather try stool  softeners and see if his bleeding stops.  The patient has been explained the plan and agrees with it.    Lucilla Lame, MD. Marval Regal   Note: This dictation was prepared with Dragon dictation along with smaller phrase technology. Any transcriptional errors that result from this process are unintentional.

## 2017-10-03 ENCOUNTER — Encounter: Payer: Self-pay | Admitting: Gastroenterology

## 2017-10-03 ENCOUNTER — Ambulatory Visit: Payer: Medicare Other | Admitting: Gastroenterology

## 2017-10-03 VITALS — BP 118/67 | HR 60 | Ht 71.0 in | Wt 185.0 lb

## 2017-10-03 DIAGNOSIS — K921 Melena: Secondary | ICD-10-CM

## 2017-10-10 ENCOUNTER — Other Ambulatory Visit: Payer: Self-pay

## 2017-10-10 ENCOUNTER — Telehealth: Payer: Self-pay | Admitting: Gastroenterology

## 2017-10-10 DIAGNOSIS — K921 Melena: Secondary | ICD-10-CM

## 2017-10-10 MED ORDER — NA SULFATE-K SULFATE-MG SULF 17.5-3.13-1.6 GM/177ML PO SOLN: 1.0000 | ORAL | 0 refills | Status: DC

## 2017-10-10 NOTE — Telephone Encounter (Signed)
Pt daughter is calling to set up whatever Dr. Allen Norris suggested for pt to do pt is bleeding again please call daughter  cb [phone] Wing Schoch

## 2017-10-10 NOTE — Telephone Encounter (Signed)
Pt's daughter, Otho Perl notified per Dr. Dorothey Baseman office visit note 1 week ago, he recommended pt to have a colonoscopy due to rectal bleeding. Due to recent bleeding again he has been scheduled for a colonoscopy at [facility] on 10/23/17.

## 2017-10-11 ENCOUNTER — Ambulatory Visit: Payer: Medicare Other | Admitting: Gastroenterology

## 2017-10-17 ENCOUNTER — Ambulatory Visit: Payer: Medicare Other | Admitting: Gastroenterology

## 2017-10-18 ENCOUNTER — Telehealth: Payer: Self-pay

## 2017-10-18 NOTE — Telephone Encounter (Signed)
IF the bleeding is a large amount or he is feeling dizzy or lightheaded then he should go to the ER.

## 2017-10-18 NOTE — Telephone Encounter (Signed)
Pt's daughter called stating her father is having rectal bleeding again and there are clots in his depends. He is having a colonoscopy on Tuesday. She is looking advice. She is wondering if he needs to go to the ER.

## 2017-10-19 NOTE — Telephone Encounter (Signed)
Pt's daughter has been advised

## 2017-10-23 ENCOUNTER — Other Ambulatory Visit: Payer: Self-pay

## 2017-10-23 ENCOUNTER — Inpatient Hospital Stay: Payer: Medicare Other

## 2017-10-23 ENCOUNTER — Encounter: Payer: Self-pay | Admitting: *Deleted

## 2017-10-23 ENCOUNTER — Inpatient Hospital Stay
Admission: AD | Admit: 2017-10-23 | Discharge: 2017-10-25 | DRG: 812 | Disposition: A | Discharge status: Home / Self Care | Payer: Medicare Other | Source: Ambulatory Visit | Attending: Internal Medicine | Admitting: Internal Medicine

## 2017-10-23 ENCOUNTER — Ambulatory Visit: Payer: Medicare Other | Admitting: Certified Registered"

## 2017-10-23 ENCOUNTER — Encounter
Admission: AD | Disposition: A | Discharge status: Home / Self Care | Payer: Self-pay | Source: Ambulatory Visit | Attending: Internal Medicine

## 2017-10-23 ENCOUNTER — Encounter: Payer: Self-pay | Admitting: Internal Medicine

## 2017-10-23 DIAGNOSIS — Z9049 Acquired absence of other specified parts of digestive tract: Secondary | ICD-10-CM | POA: Diagnosis not present

## 2017-10-23 DIAGNOSIS — D62 Acute posthemorrhagic anemia: Secondary | ICD-10-CM | POA: Diagnosis present

## 2017-10-23 DIAGNOSIS — Z888 Allergy status to other drugs, medicaments and biological substances status: Secondary | ICD-10-CM | POA: Diagnosis not present

## 2017-10-23 DIAGNOSIS — Z86718 Personal history of other venous thrombosis and embolism: Secondary | ICD-10-CM | POA: Diagnosis not present

## 2017-10-23 DIAGNOSIS — D649 Anemia, unspecified: Secondary | ICD-10-CM | POA: Diagnosis not present

## 2017-10-23 DIAGNOSIS — R0902 Hypoxemia: Secondary | ICD-10-CM | POA: Diagnosis present

## 2017-10-23 DIAGNOSIS — Z8249 Family history of ischemic heart disease and other diseases of the circulatory system: Secondary | ICD-10-CM

## 2017-10-23 DIAGNOSIS — N183 Chronic kidney disease, stage 3 (moderate): Secondary | ICD-10-CM | POA: Diagnosis present

## 2017-10-23 DIAGNOSIS — Z8042 Family history of malignant neoplasm of prostate: Secondary | ICD-10-CM | POA: Diagnosis not present

## 2017-10-23 DIAGNOSIS — Z950 Presence of cardiac pacemaker: Secondary | ICD-10-CM | POA: Diagnosis not present

## 2017-10-23 DIAGNOSIS — I129 Hypertensive chronic kidney disease with stage 1 through stage 4 chronic kidney disease, or unspecified chronic kidney disease: Secondary | ICD-10-CM | POA: Diagnosis present

## 2017-10-23 DIAGNOSIS — I482 Chronic atrial fibrillation: Secondary | ICD-10-CM | POA: Diagnosis present

## 2017-10-23 DIAGNOSIS — Z85820 Personal history of malignant melanoma of skin: Secondary | ICD-10-CM | POA: Diagnosis not present

## 2017-10-23 DIAGNOSIS — R001 Bradycardia, unspecified: Secondary | ICD-10-CM | POA: Diagnosis present

## 2017-10-23 DIAGNOSIS — K641 Second degree hemorrhoids: Secondary | ICD-10-CM | POA: Diagnosis not present

## 2017-10-23 DIAGNOSIS — Z803 Family history of malignant neoplasm of breast: Secondary | ICD-10-CM

## 2017-10-23 DIAGNOSIS — D122 Benign neoplasm of ascending colon: Secondary | ICD-10-CM | POA: Diagnosis present

## 2017-10-23 DIAGNOSIS — K921 Melena: Secondary | ICD-10-CM

## 2017-10-23 DIAGNOSIS — D500 Iron deficiency anemia secondary to blood loss (chronic): Secondary | ICD-10-CM | POA: Diagnosis not present

## 2017-10-23 HISTORY — PX: COLONOSCOPY WITH PROPOFOL: SHX5780

## 2017-10-23 LAB — COMPREHENSIVE METABOLIC PANEL
ALT: 13 U/L — ABNORMAL LOW (ref 17–63)
AST: 23 U/L (ref 15–41)
Albumin: 3.4 g/dL — ABNORMAL LOW (ref 3.5–5.0)
Alkaline Phosphatase: 64 U/L (ref 38–126)
Anion gap: 9 (ref 5–15)
BUN: 20 mg/dL (ref 6–20)
CO2: 23 mmol/L (ref 22–32)
Calcium: 8.5 mg/dL — ABNORMAL LOW (ref 8.9–10.3)
Chloride: 112 mmol/L — ABNORMAL HIGH (ref 101–111)
Creatinine, Ser: 1.79 mg/dL — ABNORMAL HIGH (ref 0.61–1.24)
GFR calc Af Amer: 38 mL/min — ABNORMAL LOW (ref >60)
GFR, EST NON AFRICAN AMERICAN: 33 mL/min — AB (ref >60)
Glucose, Bld: 99 mg/dL (ref 65–99)
POTASSIUM: 4 mmol/L (ref 3.5–5.1)
SODIUM: 144 mmol/L (ref 135–145)
TOTAL PROTEIN: 5.7 g/dL — AB (ref 6.5–8.1)
Total Bilirubin: 0.8 mg/dL (ref 0.3–1.2)

## 2017-10-23 LAB — URINALYSIS, COMPLETE (UACMP) WITH MICROSCOPIC
Bacteria, UA: NONE SEEN
Bilirubin Urine: NEGATIVE
GLUCOSE, UA: NEGATIVE mg/dL
Ketones, ur: NEGATIVE mg/dL
Leukocytes, UA: NEGATIVE
Nitrite: NEGATIVE
PH: 5 (ref 5.0–8.0)
Protein, ur: NEGATIVE mg/dL
SPECIFIC GRAVITY, URINE: 1.015 (ref 1.005–1.030)

## 2017-10-23 LAB — CBC
HCT: 21.2 % — ABNORMAL LOW (ref 40.0–52.0)
Hemoglobin: 7 g/dL — ABNORMAL LOW (ref 13.0–18.0)
MCH: 35.2 pg — ABNORMAL HIGH (ref 26.0–34.0)
MCHC: 33.1 g/dL (ref 32.0–36.0)
MCV: 106.5 fL — AB (ref 80.0–100.0)
Platelets: 179 10*3/uL (ref 150–440)
RBC: 1.99 MIL/uL — ABNORMAL LOW (ref 4.40–5.90)
RDW: 19.6 % — ABNORMAL HIGH (ref 11.5–14.5)
WBC: 6.6 10*3/uL (ref 3.8–10.6)

## 2017-10-23 LAB — TROPONIN I
TROPONIN I: 0.05 ng/mL — AB (ref <0.03)
Troponin I: 0.05 ng/mL (ref <0.03)
Troponin I: 0.05 ng/mL (ref <0.03)

## 2017-10-23 LAB — PREPARE RBC (CROSSMATCH)

## 2017-10-23 SURGERY — COLONOSCOPY WITH PROPOFOL
Anesthesia: General

## 2017-10-23 MED ORDER — SENNOSIDES-DOCUSATE SODIUM 8.6-50 MG PO TABS: 1.0000 | ORAL_TABLET | Freq: Every evening | ORAL | Status: DC | PRN

## 2017-10-23 MED ORDER — LIDOCAINE HCL (PF) 1 % IJ SOLN
INTRAMUSCULAR | Status: AC
  Filled 2017-10-23: qty 2

## 2017-10-23 MED ORDER — ONDANSETRON HCL 4 MG/2ML IJ SOLN
4.0000 mg | Freq: Four times a day (QID) | INTRAMUSCULAR | Status: DC | PRN
  Filled 2017-10-23: qty 2

## 2017-10-23 MED ORDER — ACETAMINOPHEN 650 MG RE SUPP
650.0000 mg | Freq: Four times a day (QID) | RECTAL | Status: DC | PRN
  Filled 2017-10-23: qty 1

## 2017-10-23 MED ORDER — SODIUM CHLORIDE 0.9% FLUSH: 3.0000 mL | INTRAVENOUS | Status: DC | PRN

## 2017-10-23 MED ORDER — SODIUM CHLORIDE 0.9 % IV SOLN
250.0000 mL | INTRAVENOUS | Status: DC | PRN
  Administered 2017-10-24: 14:00:00 via INTRAVENOUS

## 2017-10-23 MED ORDER — TRAMADOL HCL 50 MG PO TABS
50.0000 mg | ORAL_TABLET | Freq: Two times a day (BID) | ORAL | Status: DC
  Administered 2017-10-23 (×2): 50 mg via ORAL
  Filled 2017-10-23 (×2): qty 1

## 2017-10-23 MED ORDER — PROPOFOL 500 MG/50ML IV EMUL
INTRAVENOUS | Status: AC
  Filled 2017-10-23: qty 50

## 2017-10-23 MED ORDER — PROPOFOL 500 MG/50ML IV EMUL
INTRAVENOUS | Status: DC | PRN
  Administered 2017-10-23: 100 ug/kg/min via INTRAVENOUS

## 2017-10-23 MED ORDER — VITAMIN B-12 1000 MCG PO TABS
1000.0000 ug | ORAL_TABLET | Freq: Every day | ORAL | Status: DC
  Administered 2017-10-23 – 2017-10-25 (×3): 1000 ug via ORAL
  Filled 2017-10-23 (×3): qty 1

## 2017-10-23 MED ORDER — SODIUM CHLORIDE 0.9 % IV SOLN: INTRAVENOUS | Status: DC

## 2017-10-23 MED ORDER — SODIUM CHLORIDE 0.9 % IV SOLN
Freq: Once | INTRAVENOUS | Status: AC
  Administered 2017-10-23: 14:00:00 via INTRAVENOUS

## 2017-10-23 MED ORDER — SODIUM CHLORIDE 0.9% FLUSH
3.0000 mL | Freq: Two times a day (BID) | INTRAVENOUS | Status: DC
  Administered 2017-10-23 – 2017-10-24 (×3): 3 mL via INTRAVENOUS

## 2017-10-23 MED ORDER — PANTOPRAZOLE SODIUM 40 MG PO TBEC: 40.0000 mg | DELAYED_RELEASE_TABLET | Freq: Every day | ORAL | Status: DC

## 2017-10-23 MED ORDER — FERROUS SULFATE 325 (65 FE) MG PO TABS
325.0000 mg | ORAL_TABLET | Freq: Every day | ORAL | Status: DC
  Administered 2017-10-25: 325 mg via ORAL
  Filled 2017-10-23: qty 1

## 2017-10-23 MED ORDER — GLYCOPYRROLATE 0.2 MG/ML IJ SOLN
INTRAMUSCULAR | Status: DC | PRN
  Administered 2017-10-23: 0.1 mg via INTRAVENOUS

## 2017-10-23 MED ORDER — ONDANSETRON HCL 4 MG PO TABS: 4.0000 mg | ORAL_TABLET | Freq: Four times a day (QID) | ORAL | Status: DC | PRN

## 2017-10-23 MED ORDER — LIDOCAINE HCL (PF) 2 % IJ SOLN
INTRAMUSCULAR | Status: AC
  Filled 2017-10-23: qty 10

## 2017-10-23 MED ORDER — GLYCOPYRROLATE 0.2 MG/ML IJ SOLN
INTRAMUSCULAR | Status: AC
  Filled 2017-10-23: qty 1

## 2017-10-23 MED ORDER — PANTOPRAZOLE SODIUM 40 MG IV SOLR
40.0000 mg | Freq: Two times a day (BID) | INTRAVENOUS | Status: DC
  Administered 2017-10-23 – 2017-10-25 (×3): 40 mg via INTRAVENOUS
  Filled 2017-10-23 (×3): qty 40

## 2017-10-23 MED ORDER — SODIUM CHLORIDE 0.9 % IV SOLN
INTRAVENOUS | Status: DC
  Administered 2017-10-23: 09:00:00 via INTRAVENOUS

## 2017-10-23 MED ORDER — VITAMIN D3 25 MCG (1000 UNIT) PO TABS
1000.0000 [IU] | ORAL_TABLET | Freq: Every day | ORAL | Status: DC
  Administered 2017-10-23 – 2017-10-25 (×3): 1000 [IU] via ORAL
  Filled 2017-10-23 (×3): qty 1

## 2017-10-23 MED ORDER — PANTOPRAZOLE SODIUM 40 MG PO TBEC
40.0000 mg | DELAYED_RELEASE_TABLET | Freq: Two times a day (BID) | ORAL | Status: DC
  Administered 2017-10-23: 40 mg via ORAL
  Filled 2017-10-23: qty 1

## 2017-10-23 MED ORDER — PROPOFOL 10 MG/ML IV BOLUS
INTRAVENOUS | Status: DC | PRN
  Administered 2017-10-23: 50 mg via INTRAVENOUS

## 2017-10-23 MED ORDER — LIDOCAINE HCL (CARDIAC) PF 100 MG/5ML IV SOSY
PREFILLED_SYRINGE | INTRAVENOUS | Status: DC | PRN
  Administered 2017-10-23: 50 mg via INTRAVENOUS

## 2017-10-23 MED ORDER — ACETAMINOPHEN 325 MG PO TABS
650.0000 mg | ORAL_TABLET | Freq: Four times a day (QID) | ORAL | Status: DC | PRN
  Administered 2017-10-24: 650 mg via ORAL
  Filled 2017-10-23: qty 2

## 2017-10-23 NOTE — Anesthesia Preprocedure Evaluation (Signed)
Anesthesia Evaluation  Patient identified by MRN, date of birth, ID band Patient awake    Reviewed: Allergy & Precautions, H&P , NPO status , Patient's Chart, lab work & pertinent test results, reviewed documented beta blocker date and time   History of Anesthesia Complications Negative for: history of anesthetic complications  Airway Mallampati: I  TM Distance: >3 FB Neck ROM: full    Dental  (+) Dental Advidsory Given, Teeth Intact   Pulmonary neg pulmonary ROS,           Cardiovascular Exercise Tolerance: Good hypertension, (-) angina+ Peripheral Vascular Disease and + DVT  (-) CAD, (-) Past MI, (-) Cardiac Stents and (-) CABG + dysrhythmias Atrial Fibrillation + pacemaker (-) Valvular Problems/Murmurs     Neuro/Psych negative neurological ROS  negative psych ROS   GI/Hepatic Neg liver ROS, GERD  ,  Endo/Other  negative endocrine ROS  Renal/GU CRFRenal disease  negative genitourinary   Musculoskeletal   Abdominal   Peds  Hematology  (+) anemia ,   Anesthesia Other Findings Past Medical History: No date: Arthritis No date: Atrial fibrillation (HCC) No date: Blood clotting tendency (HCC) No date: Chronic kidney disease No date: DVT (deep venous thrombosis) (HCC) No date: Hypertension No date: Melanoma ([place]) No date: Pacemaker   Reproductive/Obstetrics negative OB ROS                             Anesthesia Physical  Anesthesia Plan  ASA: III  Anesthesia Plan: General   Post-op Pain Management:    Induction: Intravenous  PONV Risk Score and Plan: 1 and Propofol infusion  Airway Management Planned: Nasal Cannula  Additional Equipment:   Intra-op Plan:   Post-operative Plan:   Informed Consent: I have reviewed the patients History and Physical, chart, labs and discussed the procedure including the risks, benefits and alternatives for the proposed anesthesia with the  patient or authorized representative who has indicated his/her understanding and acceptance.   Dental Advisory Given  Plan Discussed with: CRNA  Anesthesia Plan Comments:         Anesthesia Quick Evaluation

## 2017-10-23 NOTE — Transfer of Care (Signed)
Immediate Anesthesia Transfer of Care Note  Patient: Howard Harmon  Procedure(s) Performed: COLONOSCOPY WITH PROPOFOL (N/A )  Patient Location: PACU and Endoscopy Unit  Anesthesia Type:General  Level of Consciousness: drowsy and patient cooperative  Airway & Oxygen Therapy: Patient Spontanous Breathing and Patient connected to nasal cannula oxygen  Post-op Assessment: Report given to RN, Post -op Vital signs reviewed and stable and Patient moving all extremities  Post vital signs: Reviewed and stable  Last Vitals:  Vitals Value Taken Time  BP 109/58 10/23/2017  9:35 AM  Temp 36.2 C 10/23/2017  9:35 AM  Pulse 62 10/23/2017  9:35 AM  Resp 19 10/23/2017  9:35 AM  SpO2 100 % 10/23/2017  9:35 AM    Last Pain:  Vitals:   10/23/17 0935  PainSc: Asleep         Complications: No apparent anesthesia complications

## 2017-10-23 NOTE — OR Nursing (Signed)
Roselyn Reef here to transport pt to telemetry room 243.

## 2017-10-23 NOTE — Progress Notes (Signed)
Advanced care plan.  Purpose of the Encounter: CODE STATUS  Parties in Attendance:Patient  Patient's Decision Capacity:Good Subjective/Patient's story: Presented for low heart rate and decreased oxygen saturation Objective/Medical story Has bradycardia, hypoxia Had colonoscopy and polyp removed Goals of care determination:  Advance directives were discussed Patient wants everything done which inculdes cardiac resuscitation, intubation and ventilator if need arises. CODE STATUS: Full code Time spent discussing advanced care planning: 16 minutes

## 2017-10-23 NOTE — Progress Notes (Signed)
MD Pyreddy was notified of trop 0.05. No further concerns at this time.

## 2017-10-23 NOTE — Op Note (Signed)
[facility] Gastroenterology Patient Name: Howard Harmon Procedure Date: 10/23/2017 9:12 AM MRN: [id number] Account #: [id number] Date of Birth: May 05, 1930 Admit Type: Outpatient Age: 84 Room: [facility] ENDO ROOM 4 Gender: Male Note Status: Finalized Procedure:            Colonoscopy Indications:          Hematochezia Providers:            Lucilla Lame MD, MD Referring MD:         Rusty Aus, MD (Referring MD) Medicines:            Propofol per Anesthesia Complications:        No immediate complications. Procedure:            Pre-Anesthesia Assessment:                       - Prior to the procedure, a History and Physical was                        performed, and patient medications and allergies were                        reviewed. The patient's tolerance of previous                        anesthesia was also reviewed. The risks and benefits of                        the procedure and the sedation options and risks were                        discussed with the patient. All questions were                        answered, and informed consent was obtained. Prior                        Anticoagulants: The patient has taken no previous                        anticoagulant or antiplatelet agents. ASA Grade                        Assessment: II - A patient with mild systemic disease.                        After reviewing the risks and benefits, the patient was                        deemed in satisfactory condition to undergo the                        procedure.                       After obtaining informed consent, the colonoscope was                        passed under direct vision. Throughout the procedure,  the patient's blood pressure, pulse, and oxygen                        saturations were monitored continuously. The                        Colonoscope was introduced through the anus and                        advanced to the the cecum,  identified by appendiceal                        orifice and ileocecal valve. The colonoscopy was                        performed without difficulty. The patient tolerated the                        procedure well. The quality of the bowel preparation                        was excellent. Findings:      The perianal and digital rectal examinations were normal.      A 4 mm polyp was found in the ascending colon. The polyp was sessile.       The polyp was removed with a cold snare. Resection and retrieval were       complete.      Non-bleeding internal hemorrhoids were found during retroflexion. The       hemorrhoids were Grade II (internal hemorrhoids that prolapse but reduce       spontaneously).      No diverticulosis seen. Impression:           - One 4 mm polyp in the ascending colon, removed with a                        cold snare. Resected and retrieved.                       - Non-bleeding internal hemorrhoids.                       - No diverticulosis seen. Recommendation:       - Resume previous diet.                       - Continue present medications.                       - Await pathology results. Procedure Code(s):    --- Professional ---                       [phone], Colonoscopy, flexible; with removal of tumor(s),                        polyp(s), or other lesion(s) by snare technique Diagnosis Code(s):    --- Professional ---                       K92.1, Melena (includes Hematochezia)  D12.2, Benign neoplasm of ascending colon CPT copyright 2017 American Medical Association. All rights reserved. The codes documented in this report are preliminary and upon coder review may  be revised to meet current compliance requirements. Lucilla Lame MD, MD 10/23/2017 9:32:27 AM This report has been signed electronically. Number of Addenda: 0 Note Initiated On: 10/23/2017 9:12 AM Scope Withdrawal Time: 0 hours 7 minutes 17 seconds  Total Procedure Duration: 0  hours 13 minutes 35 seconds       [facility]

## 2017-10-23 NOTE — Progress Notes (Signed)
Pt ambulated to BR to urinate. While sitting on toilet pt began to bleed bright red blood (3 drops). Bladder scan showed >200 cc. MD Benjie Karvonen was notified and was told to in and out cath pt once. 225 cc was removed.

## 2017-10-23 NOTE — H&P (Signed)
[facility] NAME: Howard Harmon    MR#:  [id number]  DATE OF BIRTH:  1929/11/08  DATE OF ADMISSION:  10/23/2017  PRIMARY CARE PHYSICIAN: Rusty Aus, MD   REQUESTING/REFERRING PHYSICIAN:   CHIEF COMPLAINT: Referred from endoscopy unit for bradycardia  HISTORY OF PRESENT ILLNESS: Howard Harmon  is a 84 y.o. male with a known history of atrial fibrillation, chronic kidney disease, DVT not on anticoagulation secondary to history of GI bleed, pacemaker was referred from endoscopy unit for low heart rate and low oxygen saturation.  Patient came from home for outpatient colonoscopy.  Patient had a low oxygen levels and his heart rate was around 40 bpm.  Patient was put on oxygen via nasal cannula.  No complaints of any chest pain no fever and chills colonoscopy was done and polyp was removed.  Laboratory work-up done in the endoscopy suite showed hemoglobin of 7.  1 unit PRBC IV transfusion was ordered patient was found to have hemorrhoids.  PAST MEDICAL HISTORY:   Past Medical History:  Diagnosis Date  . Arthritis   . Atrial fibrillation ([place])   . Blood clotting tendency ([place])   . Chronic kidney disease   . DVT (deep venous thrombosis) ([place])   . Hypertension   . Melanoma ([place])   . Pacemaker     PAST SURGICAL HISTORY:  Past Surgical History:  Procedure Laterality Date  . CHOLECYSTECTOMY    . COLONOSCOPY WITH PROPOFOL N/A 10/15/2014   Procedure: COLONOSCOPY WITH PROPOFOL;  Surgeon: Josefine Class, MD;  Location: [facility] ENDOSCOPY;  Service: Endoscopy;  Laterality: N/A;  . INSERT / REPLACE / REMOVE PACEMAKER      SOCIAL HISTORY:  Social History   Tobacco Use  . Smoking status: Never Smoker  . Smokeless tobacco: Never Used  Substance Use Topics  . Alcohol use: No    FAMILY HISTORY:  Family History  Problem Relation Age of Onset  . CAD Father   . Prostate cancer Brother   . Breast cancer Sister     DRUG  ALLERGIES:  Allergies  Allergen Reactions  . Warfarin Other (See Comments)    Bleeding diff to stop    REVIEW OF SYSTEMS:   CONSTITUTIONAL: No fever,has fatigue and weakness.  EYES: No blurred or double vision.  EARS, NOSE, AND THROAT: No tinnitus or ear pain.  RESPIRATORY: No cough, shortness of breath, wheezing or hemoptysis.  CARDIOVASCULAR: No chest pain, orthopnea, edema.  GASTROINTESTINAL: No nausea, vomiting, diarrhea or abdominal pain.  GENITOURINARY: No dysuria, hematuria.  ENDOCRINE: No polyuria, nocturia,  HEMATOLOGY: No anemia, easy bruising or bleeding SKIN: No rash or lesion. MUSCULOSKELETAL: No joint pain or arthritis.   NEUROLOGIC: No tingling, numbness, weakness.  PSYCHIATRY: No anxiety or depression.   MEDICATIONS AT HOME:  Prior to Admission medications   Medication Sig Start Date End Date Taking? Authorizing Provider  aspirin EC 81 MG tablet Take 81 mg by mouth daily.   Yes [provider]  Cholecalciferol (VITAMIN D3 PO) Take 1 tablet by mouth daily.   Yes [provider]  docusate sodium (COLACE) 100 MG capsule Take 100-200 mg by mouth daily.   Yes [provider]  furosemide (LASIX) 20 MG tablet Take by mouth. 04/25/17  Yes [provider]  Iron-Vitamin C 65-125 MG TABS Take 1-2 tablets by mouth daily.   Yes [provider]  LACTASE ENZYME PO Take 1 tablet by mouth as needed.  Yes [provider]  Na Sulfate-K Sulfate-Mg Sulf (SUPREP BOWEL PREP KIT) 17.5-3.13-1.6 GM/177ML SOLN Take 1 kit by mouth as directed. 10/10/17  Yes Lucilla Lame, MD  OMEGA-3 FATTY ACIDS PO Take 1-2 capsules by mouth daily.   Yes [provider]  RABEprazole (ACIPHEX) 20 MG tablet Take 20 mg by mouth daily.    Yes [provider]  SENNA LEAVES PO Take 470 mg by mouth daily.   Yes [provider]  traMADol (ULTRAM) 50 MG tablet Take 50 mg by mouth 2 (two) times daily.    Yes [provider]   vitamin B-12 (CYANOCOBALAMIN) 1000 MCG tablet Take 1,000 mcg by mouth daily.   Yes [provider]      PHYSICAL EXAMINATION:   VITAL SIGNS: Blood pressure (!) 138/58, pulse 63, temperature 98.4 F (36.9 C), temperature source Oral, resp. rate 17, height '5\' 11"'$  (1.803 m), weight 80.5 kg (177 lb 8 oz), SpO2 92 %.  GENERAL:  84 y.o.-year-old patient lying in the bed with no acute distress.  EYES: Pupils equal, round, reactive to light and accommodation. No scleral icterus. Extraocular muscles intact.  HEENT: Head atraumatic, normocephalic. Oropharynx and nasopharynx clear.  Pallor present NECK:  Supple, no jugular venous distention. No thyroid enlargement, no tenderness.  LUNGS: Normal breath sounds bilaterally, no wheezing, rales,rhonchi or crepitation. No use of accessory muscles of respiration.  CARDIOVASCULAR: S1, S2 normal. No murmurs, rubs, or gallops.  ABDOMEN: Soft, nontender, nondistended. Bowel sounds present. No organomegaly or mass.  EXTREMITIES: No pedal edema, cyanosis, or clubbing.  NEUROLOGIC: Cranial nerves II through XII are intact. Muscle strength 5/5 in all extremities. Sensation intact. Gait not checked.  PSYCHIATRIC: The patient is alert and oriented x 3.  SKIN: No obvious rash, lesion, or ulcer.   LABORATORY PANEL:   CBC Recent Labs  Lab 10/23/17 0827  WBC 6.6  HGB 7.0*  HCT 21.2*  PLT 179  MCV 106.5*  MCH 35.2*  MCHC 33.1  RDW 19.6*   ------------------------------------------------------------------------------------------------------------------  Chemistries  No results for input(s): NA, K, CL, CO2, GLUCOSE, BUN, CREATININE, CALCIUM, MG, AST, ALT, ALKPHOS, BILITOT in the last 168 hours.  Invalid input(s): GFRCGP ------------------------------------------------------------------------------------------------------------------ CrCl cannot be calculated (Patient's most recent lab result is older than the maximum 21 days  allowed.). ------------------------------------------------------------------------------------------------------------------ No results for input(s): TSH, T4TOTAL, T3FREE, THYROIDAB in the last 72 hours.  Invalid input(s): FREET3   Coagulation profile No results for input(s): INR, PROTIME in the last 168 hours. ------------------------------------------------------------------------------------------------------------------- No results for input(s): DDIMER in the last 72 hours. -------------------------------------------------------------------------------------------------------------------  Cardiac Enzymes Recent Labs  Lab 10/23/17 1139  TROPONINI 0.05*   ------------------------------------------------------------------------------------------------------------------ Invalid input(s): POCBNP  ---------------------------------------------------------------------------------------------------------------  Urinalysis    Component Value Date/Time   COLORURINE Yellow 09/18/2012 2138   APPEARANCEUR Clear 09/18/2012 2138   LABSPEC 1.014 09/18/2012 2138   PHURINE 5.0 09/18/2012 2138   GLUCOSEU Negative 09/18/2012 2138   HGBUR Negative 09/18/2012 2138   BILIRUBINUR Negative 09/18/2012 2138   KETONESUR Negative 09/18/2012 2138   PROTEINUR Negative 09/18/2012 2138   NITRITE Negative 09/18/2012 2138   LEUKOCYTESUR Trace 09/18/2012 2138     RADIOLOGY: Dg Chest 2 View  Result Date: 10/23/2017 CLINICAL DATA:  Hypoxia status post colonoscopy. EXAM: CHEST - 2 VIEW COMPARISON:  Radiographs of December 12, 2016. FINDINGS: Stable cardiomediastinal silhouette. Left-sided pacemaker is unchanged in position. No pneumothorax or pleural effusion is noted. No acute pulmonary disease is noted. Degenerative changes are seen involving both glenohumeral joints. IMPRESSION: No active cardiopulmonary  disease. Electronically Signed   By: Marijo Conception, M.D.   On: 10/23/2017 12:31    EKG: Orders  placed or performed during the hospital encounter of 10/23/17  . EKG 12-Lead  . EKG 12-Lead    IMPRESSION AND PLAN:  84 year old elderly male patient with history of atrial fibrillation, GI bleed, DVT, hypertension, chronic kidney disease, pacemaker not on any anticoagulation for GI bleed presented for low heart rate and low oxygen saturation.  -Bradycardia Check thyroid profile Twelve-lead EKG  -Hypoxia Oxygen via nasal cannula Check chest x-ray for any pneumonia  -Anemia symptomatic 1 unit PRBC transfusion Status post colonoscopy and polyp removed Has hemorrhoids which need to be banded  -History of DVT No anticoagulation secondary to history of GI bleed  -Chronic kidney disease monitor renal function  All the records are reviewed and case discussed with ED provider. Management plans discussed with the patient, family and they are in agreement.  CODE STATUS:Full code    Code Status Orders  (From admission, onward)        Start     Ordered   10/23/17 1044  Full code  Continuous     10/23/17 1043    Code Status History    Date Active Date Inactive Code Status Order ID Comments User Context   01/01/2015 1104 01/02/2015 1539 Full Code [id number]  Vaughan Basta, MD Inpatient   10/12/2014 1731 10/15/2014 1922 Full Code [id number]  Demetrios Loll, MD ED       TOTAL TIME TAKING CARE OF THIS PATIENT: 51 minutes.    Saundra Shelling M.D on 10/23/2017 at 2:54 PM  Between 7am to 6pm - Pager - [phone]  After 6pm go to [email or web address] - password [email or web address] [facility]  Office  [phone]  CC: Primary care physician; Rusty Aus, MD

## 2017-10-23 NOTE — Anesthesia Postprocedure Evaluation (Signed)
Anesthesia Post Note  Patient: Howard Harmon  Procedure(s) Performed: COLONOSCOPY WITH PROPOFOL (N/A )  Patient location during evaluation: Endoscopy Anesthesia Type: General Level of consciousness: awake and alert, oriented and patient cooperative Pain management: satisfactory to patient Vital Signs Assessment: post-procedure vital signs reviewed and stable Respiratory status: spontaneous breathing and respiratory function stable Cardiovascular status: stable and blood pressure returned to baseline Postop Assessment: no headache, no backache, patient able to bend at knees, no apparent nausea or vomiting, adequate PO intake and able to ambulate Anesthetic complications: no     Last Vitals:  Vitals:   10/23/17 0720 10/23/17 0935  BP: 122/63 (!) 109/58  Pulse: (!) 132 62  Resp: 18 19  Temp: 36.6 C (!) 36.2 C  SpO2: (!) 88% 100%    Last Pain:  Vitals:   10/23/17 0935  PainSc: [place]

## 2017-10-23 NOTE — Anesthesia Post-op Follow-up Note (Signed)
Anesthesia QCDR form completed.        

## 2017-10-23 NOTE — Consult Note (Signed)
Vonda Antigua, MD [address], [place], [place], [place], [address] [address], [place], [place], [place], [address] Phone: [phone]  Fax: [phone]  Consultation  Referring Provider:     Dr. Estanislado Pandy Primary Care Physician:  Rusty Aus, MD Reason for Consultation:     Hemorrhoids, anemia  Date of Admission:  10/23/2017 Date of Consultation:  10/23/2017         HPI:   Howard Harmon is a 84 y.o. male with history of hematochezia dating back to 2016, who was in the hospital today for an outpatient colonoscopy, due to hematochezia, and and admitted postop due to low heart rate and low oxygen saturation postprocedure.  Colonoscopy today showed 4 mm polyp in the ascending colon that was removed with a cold snare.  Nonbleeding internal hemorrhoids.  No diverticulosis seen.    On admission, patient was found to have hemoglobin of 7.  It was 9, 3 weeks ago, 12.5, 9 months ago.  Low ferritin at 12, 3 weeks ago. States last episode of bleeding was about 2-3 weeks ago. Episodes occur once a month and spontaneously resolve.   During his GI clinic with Dr. Allen Norris, on May 1, patient had complained of rectal bleeding, with an episode that he reported to be a large amount of bright red blood with clots a week prior to his clinic visit.  He was offered a colonoscopy, but he wanted to try stool softeners to see if things resolved.  However, hematochezia continued and he called the office to schedule a colonoscopy which was done today.  2016 colonoscopy done for hematochezia, showed internal hemorrhoids, diverticulosis, and polyps were removed.  See colonoscopy report.  Past Medical History:  Diagnosis Date  . Arthritis   . Atrial fibrillation ([place])   . Blood clotting tendency ([place])   . Chronic kidney disease   . DVT (deep venous thrombosis) ([place])   . Hypertension   . Melanoma ([place])   . Pacemaker     Past Surgical History:  Procedure Laterality Date  . CHOLECYSTECTOMY    .  COLONOSCOPY WITH PROPOFOL N/A 10/15/2014   Procedure: COLONOSCOPY WITH PROPOFOL;  Surgeon: Josefine Class, MD;  Location: [facility] ENDOSCOPY;  Service: Endoscopy;  Laterality: N/A;  . INSERT / REPLACE / REMOVE PACEMAKER      Prior to Admission medications   Medication Sig Start Date End Date Taking? Authorizing Provider  aspirin EC 81 MG tablet Take 81 mg by mouth daily.   Yes [provider]  Cholecalciferol (VITAMIN D3 PO) Take 1 tablet by mouth daily.   Yes [provider]  docusate sodium (COLACE) 100 MG capsule Take 100-200 mg by mouth daily.   Yes [provider]  furosemide (LASIX) 20 MG tablet Take by mouth. 04/25/17  Yes [provider]  Iron-Vitamin C 65-125 MG TABS Take 1-2 tablets by mouth daily.   Yes [provider]  LACTASE ENZYME PO Take 1 tablet by mouth as needed.   Yes [provider]  Na Sulfate-K Sulfate-Mg Sulf (SUPREP BOWEL PREP KIT) 17.5-3.13-1.6 GM/177ML SOLN Take 1 kit by mouth as directed. 10/10/17  Yes Lucilla Lame, MD  OMEGA-3 FATTY ACIDS PO Take 1-2 capsules by mouth daily.   Yes [provider]  RABEprazole (ACIPHEX) 20 MG tablet Take 20 mg by mouth daily.    Yes [provider]  SENNA LEAVES PO Take 470 mg by mouth daily.   Yes [provider]  traMADol Veatrice Bourbon) 50  MG tablet Take 50 mg by mouth 2 (two) times daily.    Yes [provider]  vitamin B-12 (CYANOCOBALAMIN) 1000 MCG tablet Take 1,000 mcg by mouth daily.   Yes [provider]    Family History  Problem Relation Age of Onset  . CAD Father   . Prostate cancer Brother   . Breast cancer Sister      Social History   Tobacco Use  . Smoking status: Never Smoker  . Smokeless tobacco: Never Used  Substance Use Topics  . Alcohol use: No  . Drug use: Never    Allergies as of 10/10/2017 - Review Complete 10/03/2017  Allergen Reaction Noted  . Warfarin Other (See Comments) 01/06/2015    Review of  Systems:    All systems reviewed and negative except where noted in HPI.   Physical Exam:  Vital signs in last 24 hours: Vitals:   10/23/17 1025 10/23/17 1111 10/23/17 1400 10/23/17 1429  BP: 117/68 (!) 145/84 134/60 (!) 138/58  Pulse: 60 75 98 63  Resp: '12  18 17  '$ Temp:  98 F (36.7 C) 98.4 F (36.9 C) 98.4 F (36.9 C)  TempSrc:  Oral Oral Oral  SpO2: 94% 99% 91% 92%  Weight:  177 lb 8 oz (80.5 kg)    Height:  '5\' 11"'$  (1.803 m)     Last BM Date: 10/23/17 General:   Pleasant, cooperative in NAD Head:  Normocephalic and atraumatic. Eyes:   No icterus.   Conjunctiva pink. PERRLA. Ears:  Normal auditory acuity. Neck:  Supple; no masses or thyroidomegaly Lungs: Respirations even and unlabored. Lungs clear to auscultation bilaterally.   No wheezes, crackles, or rhonchi.  Abdomen:  Soft, nondistended, nontender. Normal bowel sounds. No appreciable masses or hepatomegaly.  No rebound or guarding.  Neurologic:  Alert and oriented x3;  grossly normal neurologically. Skin:  Intact without significant lesions or rashes. Cervical Nodes:  No significant cervical adenopathy. Psych:  Alert and cooperative. Normal affect.  LAB RESULTS: Recent Labs    10/23/17 0827  WBC 6.6  HGB 7.0*  HCT 21.2*  PLT 179   BMET Recent Labs    10/23/17 1442  NA 144  K 4.0  CL 112*  CO2 23  GLUCOSE 99  BUN 20  CREATININE 1.79*  CALCIUM 8.5*   LFT Recent Labs    10/23/17 1442  PROT 5.7*  ALBUMIN 3.4*  AST 23  ALT 13*  ALKPHOS 64  BILITOT 0.8   PT/INR No results for input(s): LABPROT, INR in the last 72 hours.  STUDIES: Dg Chest 2 View  Result Date: 10/23/2017 CLINICAL DATA:  Hypoxia status post colonoscopy. EXAM: CHEST - 2 VIEW COMPARISON:  Radiographs of December 12, 2016. FINDINGS: Stable cardiomediastinal silhouette. Left-sided pacemaker is unchanged in position. No pneumothorax or pleural effusion is noted. No acute pulmonary disease is noted. Degenerative changes are seen involving  both glenohumeral joints. IMPRESSION: No active cardiopulmonary disease. Electronically Signed   By: Marijo Conception, M.D.   On: 10/23/2017 12:31      Impression / Plan:   Howard Harmon is a 84 y.o. y/o male with hematochezia at home, found to have hemoglobin of 7 today, with very low ferritin  Patient has not had an upper endoscopy recently Colonoscopy revealed internal hemorrhoids, but do not explain his acute drop in hemoglobin Upper endoscopy is indicated to evaluate source of upper GI bleed to rule out any ulcers or lesions, as typically hemorrhoids dont drop Hemoglobin  to this extent, but it is possible  Please administer IV iron due to iron deficiency  PPI IV twice daily  Continue serial CBCs and transfuse PRN Avoid NSAIDs Maintain 2 large-bore IV lines Please page GI with any acute hemodynamic changes, or signs of active GI bleeding  If EGD does not reveal another source, surgery consult can be considered for evaluation of hemorrhoidectomy due to recurrent bleeding from hemorrhoids leading to anemia.   I have discussed alternative options, risks & benefits,  which include, but are not limited to, bleeding, infection, perforation,respiratory complication & drug reaction.  The patient agrees with this plan & written consent will be obtained.    Thank you for involving me in the care of this patient.      LOS: 0 days   Virgel Manifold, MD  10/23/2017, 3:32 PM

## 2017-10-23 NOTE — H&P (Signed)
Lucilla Lame, MD [address] [place], [place] [address] Phone:[phone] Fax : [phone]  Primary Care Physician:  Rusty Aus, MD Primary Gastroenterologist:  Dr. Allen Norris  Pre-Procedure History & Physical: HPI:  Howard Harmon is a 84 y.o. male is here for an colonoscopy.   Past Medical History:  Diagnosis Date  . Arthritis   . Atrial fibrillation ([place])   . Blood clotting tendency ([place])   . Chronic kidney disease   . DVT (deep venous thrombosis) ([place])   . Hypertension   . Melanoma ([place])   . Pacemaker     Past Surgical History:  Procedure Laterality Date  . CHOLECYSTECTOMY    . COLONOSCOPY WITH PROPOFOL N/A 10/15/2014   Procedure: COLONOSCOPY WITH PROPOFOL;  Surgeon: Josefine Class, MD;  Location: [facility] ENDOSCOPY;  Service: Endoscopy;  Laterality: N/A;  . INSERT / REPLACE / REMOVE PACEMAKER      Prior to Admission medications   Medication Sig Start Date End Date Taking? Authorizing Provider  aspirin EC 81 MG tablet Take 81 mg by mouth daily.   Yes [provider]  Cholecalciferol (VITAMIN D3 PO) Take 1 tablet by mouth daily.   Yes [provider]  docusate sodium (COLACE) 100 MG capsule Take 100-200 mg by mouth daily.   Yes [provider]  furosemide (LASIX) 20 MG tablet Take by mouth. 04/25/17  Yes [provider]  Iron-Vitamin C 65-125 MG TABS Take 1-2 tablets by mouth daily.   Yes [provider]  LACTASE ENZYME PO Take 1 tablet by mouth as needed.   Yes [provider]  Na Sulfate-K Sulfate-Mg Sulf (SUPREP BOWEL PREP KIT) 17.5-3.13-1.6 GM/177ML SOLN Take 1 kit by mouth as directed. 10/10/17  Yes Lucilla Lame, MD  OMEGA-3 FATTY ACIDS PO Take 1-2 capsules by mouth daily.   Yes [provider]  RABEprazole (ACIPHEX) 20 MG tablet Take 20 mg by mouth daily.    Yes [provider]  SENNA LEAVES PO Take 470 mg by mouth daily.   Yes [provider]  traMADol (ULTRAM) 50  MG tablet Take 50 mg by mouth 2 (two) times daily.    Yes [provider]  vitamin B-12 (CYANOCOBALAMIN) 1000 MCG tablet Take 1,000 mcg by mouth daily.   Yes [provider]    Allergies as of 10/10/2017 - Review Complete 10/03/2017  Allergen Reaction Noted  . Warfarin Other (See Comments) 01/06/2015    Family History  Problem Relation Age of Onset  . CAD Father   . Prostate cancer Brother   . Breast cancer Sister     Social History   Socioeconomic History  . Marital status: Widowed    Spouse name: Not on file  . Number of children: Not on file  . Years of education: Not on file  . Highest education level: Not on file  Occupational History  . Not on file  Social Needs  . Financial resource strain: Not on file  . Food insecurity:    Worry: Not on file    Inability: Not on file  . Transportation needs:    Medical: Not on file    Non-medical: Not on file  Tobacco Use  . Smoking status: Never Smoker  . Smokeless tobacco: Never Used  Substance and Sexual Activity  . Alcohol use: No  . Drug use: Never  . Sexual activity: Not on file  Lifestyle  . Physical activity:    Days per week: Not on  file    Minutes per session: Not on file  . Stress: Not on file  Relationships  . Social connections:    Talks on phone: Not on file    Gets together: Not on file    Attends religious service: Not on file    Active member of club or organization: Not on file    Attends meetings of clubs or organizations: Not on file    Relationship status: Not on file  . Intimate partner violence:    Fear of current or ex partner: Not on file    Emotionally abused: Not on file    Physically abused: Not on file    Forced sexual activity: Not on file  Other Topics Concern  . Not on file  Social History Narrative  . Not on file    Review of Systems: See HPI, otherwise negative ROS  Physical Exam: BP 122/63   Pulse (!) 132   Temp 97.9 F (36.6 C)   Resp 18   Ht 5'  11" (1.803 m)   Wt 185 lb (83.9 kg)   SpO2 (!) 88%   BMI 25.80 kg/m  General:   Alert,  pleasant and cooperative in NAD Head:  Normocephalic and atraumatic. Neck:  Supple; no masses or thyromegaly. Lungs:  Clear throughout to auscultation.    Heart:  Regular rate and rhythm. Abdomen:  Soft, nontender and nondistended. Normal bowel sounds, without guarding, and without rebound.   Neurologic:  Alert and  oriented x4;  grossly normal neurologically.  Impression/Plan: Howard Harmon is here for an colonoscopy to be performed for hematochezia  Risks, benefits, limitations, and alternatives regarding  colonoscopy have been reviewed with the patient.  Questions have been answered.  All parties agreeable.   Lucilla Lame, MD  10/23/2017, 9:04 AM

## 2017-10-24 ENCOUNTER — Inpatient Hospital Stay: Payer: Medicare Other | Admitting: Anesthesiology

## 2017-10-24 ENCOUNTER — Encounter: Payer: Self-pay | Admitting: *Deleted

## 2017-10-24 ENCOUNTER — Encounter
Admission: AD | Disposition: A | Discharge status: Home / Self Care | Payer: Self-pay | Source: Ambulatory Visit | Attending: Internal Medicine

## 2017-10-24 DIAGNOSIS — D62 Acute posthemorrhagic anemia: Secondary | ICD-10-CM

## 2017-10-24 HISTORY — PX: ESOPHAGOGASTRODUODENOSCOPY: SHX5428

## 2017-10-24 LAB — BASIC METABOLIC PANEL
ANION GAP: 5 (ref 5–15)
BUN: 20 mg/dL (ref 6–20)
CHLORIDE: 113 mmol/L — AB (ref 101–111)
CO2: 24 mmol/L (ref 22–32)
Calcium: 8.6 mg/dL — ABNORMAL LOW (ref 8.9–10.3)
Creatinine, Ser: 1.73 mg/dL — ABNORMAL HIGH (ref 0.61–1.24)
GFR calc Af Amer: 40 mL/min — ABNORMAL LOW (ref >60)
GFR calc non Af Amer: 34 mL/min — ABNORMAL LOW (ref >60)
GLUCOSE: 89 mg/dL (ref 65–99)
POTASSIUM: 4.2 mmol/L (ref 3.5–5.1)
Sodium: 142 mmol/L (ref 135–145)

## 2017-10-24 LAB — HEMOGLOBIN AND HEMATOCRIT, BLOOD
HCT: 28.5 % — ABNORMAL LOW (ref 40.0–52.0)
HEMOGLOBIN: 9.3 g/dL — AB (ref 13.0–18.0)

## 2017-10-24 LAB — CBC
HEMATOCRIT: 21.6 % — AB (ref 40.0–52.0)
HEMOGLOBIN: 7.1 g/dL — AB (ref 13.0–18.0)
MCH: 34 pg (ref 26.0–34.0)
MCHC: 32.8 g/dL (ref 32.0–36.0)
MCV: 103.6 fL — ABNORMAL HIGH (ref 80.0–100.0)
Platelets: 149 10*3/uL — ABNORMAL LOW (ref 150–440)
RBC: 2.09 MIL/uL — AB (ref 4.40–5.90)
RDW: 20.8 % — ABNORMAL HIGH (ref 11.5–14.5)
WBC: 7.2 10*3/uL (ref 3.8–10.6)

## 2017-10-24 LAB — SURGICAL PATHOLOGY

## 2017-10-24 SURGERY — EGD (ESOPHAGOGASTRODUODENOSCOPY)
Anesthesia: General | Wound class: Clean Contaminated

## 2017-10-24 MED ORDER — SODIUM CHLORIDE 0.9 % IV SOLN
Freq: Once | INTRAVENOUS | Status: AC
  Administered 2017-10-24: 11:00:00 via INTRAVENOUS

## 2017-10-24 MED ORDER — SODIUM CHLORIDE 0.9% FLUSH: 3.0000 mL | Freq: Two times a day (BID) | INTRAVENOUS | Status: DC

## 2017-10-24 MED ORDER — PROPOFOL 10 MG/ML IV BOLUS
INTRAVENOUS | Status: DC | PRN
  Administered 2017-10-24: 20 mg via INTRAVENOUS
  Administered 2017-10-24: 50 mg via INTRAVENOUS

## 2017-10-24 MED ORDER — PROPOFOL 500 MG/50ML IV EMUL
INTRAVENOUS | Status: AC
  Filled 2017-10-24: qty 50

## 2017-10-24 MED ORDER — SODIUM CHLORIDE 0.9 % IV SOLN: Freq: Once | INTRAVENOUS | Status: DC

## 2017-10-24 MED ORDER — STERILE WATER FOR IRRIGATION IR SOLN
Status: DC | PRN
  Administered 2017-10-24: 14:00:00

## 2017-10-24 MED ORDER — TRAMADOL HCL 50 MG PO TABS
100.0000 mg | ORAL_TABLET | Freq: Two times a day (BID) | ORAL | Status: DC
  Administered 2017-10-24 – 2017-10-25 (×2): 100 mg via ORAL
  Filled 2017-10-24 (×2): qty 2

## 2017-10-24 MED ORDER — LIDOCAINE HCL (PF) 1 % IJ SOLN
INTRAMUSCULAR | Status: AC
  Filled 2017-10-24: qty 2

## 2017-10-24 NOTE — Op Note (Signed)
[facility] Gastroenterology Patient Name: Howard Harmon Procedure Date: 10/24/2017 1:42 PM MRN: [id number] Account #: [id number] Date of Birth: 1929-07-19 Admit Type: Inpatient Age: 84 Room: [facility] ENDO ROOM 4 Gender: Male Note Status: Finalized Procedure:            Upper GI endoscopy Indications:          Acute post hemorrhagic anemia, Iron deficiency anemia                        secondary to chronic blood loss Providers:            Shenouda Genova B. Bonna Gains MD, MD Referring MD:         Rusty Aus, MD (Referring MD) Medicines:            Monitored Anesthesia Care Complications:        No immediate complications. Procedure:            Pre-Anesthesia Assessment:                       - Prior to the procedure, a History and Physical was                        performed, and patient medications, allergies and                        sensitivities were reviewed. The patient's tolerance of                        previous anesthesia was reviewed.                       - The risks and benefits of the procedure and the                        sedation options and risks were discussed with the                        patient. All questions were answered and informed                        consent was obtained.                       - Patient identification and proposed procedure were                        verified prior to the procedure by the physician, the                        nurse, the anesthesiologist, the anesthetist and the                        technician. The procedure was verified in the procedure                        room.                       - ASA Grade Assessment: III - A patient with severe  systemic disease.                       After obtaining informed consent, the endoscope was                        passed under direct vision. Throughout the procedure,                        the patient's blood pressure, pulse, and oxygen                    saturations were monitored continuously. The Endoscope                        was introduced through the mouth, and advanced to the                        third part of duodenum. The upper GI endoscopy was                        accomplished with ease. The patient tolerated the                        procedure well. Findings:      The examined esophagus was normal.      The entire examined stomach was normal.      The duodenal bulb, second portion of the duodenum and examined duodenum       were normal. Impression:           - Normal esophagus.                       - Normal stomach.                       - Normal duodenal bulb, second portion of the duodenum                        and examined duodenum.                       - No specimens collected.                       - Patient's anemia is from his known hemorrhoids. No                        other source identified on today's exam. Recommendation:       - Continue Serial CBCs and transfuse PRN                       - Refer to a surgeon today for discussion of                        hemorrhoidectomy as it is leading to patient's anemia.                       - High fiber diet.                       - Advance diet as tolerated.                       -  Continue present medications.                       - Patient has a contact number available for                        emergencies. The signs and symptoms of potential                        delayed complications were discussed with the patient.                        Return to normal activities tomorrow. Written discharge                        instructions were provided to the patient.                       - Discharge patient to home (with escort).                       - The findings and recommendations were discussed with                        the patient.                       - The findings and recommendations were discussed with                        the  patient's family. Procedure Code(s):    --- Professional ---                       [phone], Esophagogastroduodenoscopy, flexible, transoral;                        diagnostic, including collection of specimen(s) by                        brushing or washing, when performed (separate procedure) Diagnosis Code(s):    --- Professional ---                       D62, Acute posthemorrhagic anemia                       D50.0, Iron deficiency anemia secondary to blood loss                        (chronic) CPT copyright 2017 American Medical Association. All rights reserved. The codes documented in this report are preliminary and upon coder review may  be revised to meet current compliance requirements.  Vonda Antigua, MD Margretta Sidle B. Bonna Gains MD, MD 10/24/2017 2:06:37 PM This report has been signed electronically. Number of Addenda: 0 Note Initiated On: 10/24/2017 1:42 PM Estimated Blood Loss: Estimated blood loss: none.      [facility]

## 2017-10-24 NOTE — Anesthesia Postprocedure Evaluation (Signed)
Anesthesia Post Note  Patient: Howard Harmon  Procedure(s) Performed: ESOPHAGOGASTRODUODENOSCOPY (EGD) (N/A )  Patient location during evaluation: Endoscopy Anesthesia Type: General Level of consciousness: awake and alert and oriented Pain management: pain level controlled Vital Signs Assessment: post-procedure vital signs reviewed and stable Respiratory status: spontaneous breathing Cardiovascular status: blood pressure returned to baseline Anesthetic complications: no     Last Vitals:  Vitals:   10/24/17 1415 10/24/17 1425  BP: 136/78 (!) 153/78  Pulse: (!) 57 60  Resp: 18 20  Temp:    SpO2: 100% 95%    Last Pain:  Vitals:   10/24/17 1425  TempSrc:   PainSc: 0-No pain                 Wilburn Keir

## 2017-10-24 NOTE — Progress Notes (Addendum)
Vonda Antigua, MD [address] [place], [place], [place], [address] [address], [place], [place], [place], [address] Phone: [phone]  Fax: [phone]   Subjective: Pt. had bright red blood per rectum yesterday when sitting on the toilet.  Few drops seen in the toilet of bright red blood.  No hemodynamic changes.   Objective: Exam: Vital signs in last 24 hours: Vitals:   10/23/17 1659 10/23/17 1951 10/24/17 0339 10/24/17 0715  BP: (!) 149/94 119/79 134/66 131/70  Pulse: 64 69 (!) 59 61  Resp: 14 18 18 18   Temp: 98.6 F (37 C) 99.3 F (37.4 C) 98.6 F (37 C) 98.2 F (36.8 C)  TempSrc: Oral Oral  Oral  SpO2: 100% 100% 100% 99%  Weight:      Height:       Weight change:   Intake/Output Summary (Last 24 hours) at 10/24/2017 2947 Last data filed at 10/23/2017 2300 Gross per 24 hour  Intake 916 ml  Output 200 ml  Net 716 ml    General: No acute distress, AAO x3 Abd: Soft, NT/ND, No HSM Skin: Warm, no rashes Neck: Supple, Trachea midline   Lab Results: Lab Results  Component Value Date   WBC 7.2 10/24/2017   HGB 7.1 (L) 10/24/2017   HCT 21.6 (L) 10/24/2017   MCV 103.6 (H) 10/24/2017   PLT 149 (L) 10/24/2017   Micro Results: No results found for this or any previous visit (from the past 240 hour(s)). Studies/Results: Dg Chest 2 View  Result Date: 10/23/2017 CLINICAL DATA:  Hypoxia status post colonoscopy. EXAM: CHEST - 2 VIEW COMPARISON:  Radiographs of December 12, 2016. FINDINGS: Stable cardiomediastinal silhouette. Left-sided pacemaker is unchanged in position. No pneumothorax or pleural effusion is noted. No acute pulmonary disease is noted. Degenerative changes are seen involving both glenohumeral joints. IMPRESSION: No active cardiopulmonary disease. Electronically Signed   By: Marijo Conception, M.D.   On: 10/23/2017 12:31   Medications:  Scheduled Meds: . cholecalciferol  1,000 Units Oral Daily  . ferrous sulfate  325 mg Oral Q breakfast  .  pantoprazole (PROTONIX) IV  40 mg Intravenous Q12H  . sodium chloride flush  3 mL Intravenous Q12H  . traMADol  50 mg Oral BID  . vitamin B-12  1,000 mcg Oral Daily   Continuous Infusions: . sodium chloride    . sodium chloride     PRN Meds:.sodium chloride, acetaminophen **OR** acetaminophen, ondansetron **OR** ondansetron (ZOFRAN) IV, senna-docusate, sodium chloride flush   Assessment: Active Problems:   Hypoxia   Hematochezia   Benign neoplasm of ascending colon    Plan: We will plan on EGD today to rule out upper source of GI bleed If this is negative, signs and symptoms are consistent with hematochezia from internal hemorrhoids, given his chronic history Please administer IV iron as an inpatient  Hemoglobin of 7.1 this morning.  1 unit was transfused yesterday after hemoglobin of 7.  Therefore PRBC did not lead to an appropriate response.  Given bright red blood per rectum yesterday, and need for endoscopy today, will transfuse 1 more unit of packed red blood cells today, to allow patient to tolerate procedure well, without hemodynamic changes.   PPI IV twice daily  Continue serial CBCs and transfuse PRN Avoid NSAIDs Maintain 2 large-bore IV lines Please page GI with any acute hemodynamic changes, or signs of active GI bleeding  I spoke to patient's daughter upon patient's request, at length yesterday, and discussed the plan and recommendations,  and she is agreeable with everything.   LOS: 1 day   Vonda Antigua, MD 10/24/2017, 8:19 AM

## 2017-10-24 NOTE — Transfer of Care (Signed)
Immediate Anesthesia Transfer of Care Note  Patient: Howard Harmon  Procedure(s) Performed: ESOPHAGOGASTRODUODENOSCOPY (EGD) (N/A )  Patient Location: PACU  Anesthesia Type:General  Level of Consciousness: awake and drowsy  Airway & Oxygen Therapy: Patient Spontanous Breathing and Patient connected to nasal cannula oxygen  Post-op Assessment: Report given to RN and Post -op Vital signs reviewed and stable  Post vital signs: Reviewed and stable  Last Vitals:  Vitals Value Taken Time  BP    Temp    Pulse    Resp    SpO2      Last Pain:  Vitals:   10/24/17 1105  TempSrc: Oral  PainSc:          Complications: No apparent anesthesia complications

## 2017-10-24 NOTE — Progress Notes (Signed)
[facility] NAME: Howard Harmon    MR#:  [id number]  DATE OF BIRTH:  09/27/1929  SUBJECTIVE:  Patient seen and evaluated today Had some bright red blood per rectal area this morning when he was trying to have bowel movement No abdominal pain No chest pain No shortness of breath  REVIEW OF SYSTEMS:    ROS  CONSTITUTIONAL: No documented fever. No fatigue, weakness. No weight gain, no weight loss.  EYES: No blurry or double vision.  ENT: No tinnitus. No postnasal drip. No redness of the oropharynx.  RESPIRATORY: No cough, no wheeze, no hemoptysis. No dyspnea.  CARDIOVASCULAR: No chest pain. No orthopnea. No palpitations. No syncope.  GASTROINTESTINAL: No nausea, no vomiting or diarrhea. No abdominal pain. No melena or hematochezia.  GENITOURINARY: No dysuria or hematuria.  ENDOCRINE: No polyuria or nocturia. No heat or cold intolerance.  HEMATOLOGY: No anemia. No bruising. No bleeding.  INTEGUMENTARY: No rashes. No lesions.  MUSCULOSKELETAL: No arthritis. No swelling. No gout.  NEUROLOGIC: No numbness, tingling, or ataxia. No seizure-type activity.  PSYCHIATRIC: No anxiety. No insomnia. No ADD.   DRUG ALLERGIES:   Allergies  Allergen Reactions  . Warfarin Other (See Comments)    Bleeding diff to stop    VITALS:  Blood pressure (!) 153/78, pulse 60, temperature 97.6 F (36.4 C), temperature source Tympanic, resp. rate 20, height 5\' 11"  (1.803 m), weight 80.5 kg (177 lb 8 oz), SpO2 95 %.  PHYSICAL EXAMINATION:   Physical Exam  GENERAL:  84 y.o.-year-old patient lying in the bed with no acute distress.  EYES: Pupils equal, round, reactive to light and accommodation. No scleral icterus. Extraocular muscles intact.  HEENT: Head atraumatic, normocephalic. Oropharynx and nasopharynx clear.  Pallor present NECK:  Supple, no jugular venous distention. No thyroid enlargement, no tenderness.  LUNGS: Normal breath sounds  bilaterally, no wheezing, rales, rhonchi. No use of accessory muscles of respiration.  CARDIOVASCULAR: S1, S2 normal. No murmurs, rubs, or gallops.  ABDOMEN: Soft, nontender, nondistended. Bowel sounds present. No organomegaly or mass.  EXTREMITIES: No cyanosis, clubbing or edema b/l.    NEUROLOGIC: Cranial nerves II through XII are intact. No focal Motor or sensory deficits b/l.   PSYCHIATRIC: The patient is alert and oriented x 3.  SKIN: No obvious rash, lesion, or ulcer.   LABORATORY PANEL:   CBC Recent Labs  Lab 10/24/17 0603  WBC 7.2  HGB 7.1*  HCT 21.6*  PLT 149*   ------------------------------------------------------------------------------------------------------------------ Chemistries  Recent Labs  Lab 10/23/17 1442 10/24/17 0603  NA 144 142  K 4.0 4.2  CL 112* 113*  CO2 23 24  GLUCOSE 99 89  BUN 20 20  CREATININE 1.79* 1.73*  CALCIUM 8.5* 8.6*  AST 23  --   ALT 13*  --   ALKPHOS 64  --   BILITOT 0.8  --    ------------------------------------------------------------------------------------------------------------------  Cardiac Enzymes Recent Labs  Lab 10/23/17 2026  TROPONINI 0.05*   ------------------------------------------------------------------------------------------------------------------  RADIOLOGY:  Dg Chest 2 View  Result Date: 10/23/2017 CLINICAL DATA:  Hypoxia status post colonoscopy. EXAM: CHEST - 2 VIEW COMPARISON:  Radiographs of December 12, 2016. FINDINGS: Stable cardiomediastinal silhouette. Left-sided pacemaker is unchanged in position. No pneumothorax or pleural effusion is noted. No acute pulmonary disease is noted. Degenerative changes are seen involving both glenohumeral joints. IMPRESSION: No active cardiopulmonary disease. Electronically Signed   By: Marijo Conception, M.D.   On: 10/23/2017 12:31     ASSESSMENT  AND PLAN:  84 year old male patient with history of atrial fibrillation, chronic kidney disease, DVT not on  anticoagulation secondary to GI bleed, pacemaker currently under hospitalist service for hypoxia, anemia  -Symptomatic anemia  status post colonoscopy and polyps removed Had upper endoscopy today by gastroenterology Stomach, duodenum appeared to be normal Transfuse 1 unit PRBC IV today  -Hemorrhoids with active bleed Consult surgery for possible hemorrhoid ectomy  -Bradycardia resolved  -Hypoxia improved On baseline oxygen at 1 L  -DVT prophylaxis sequential compression device to lower extremities   All the records are reviewed and case discussed with Care Management/Social Worker. Management plans discussed with the patient, family and they are in agreement.  CODE STATUS: Full code  DVT Prophylaxis: SCDs  TOTAL TIME TAKING CARE OF THIS PATIENT: 35 minutes.   POSSIBLE D/C IN 2 to 3  DAYS, DEPENDING ON CLINICAL CONDITION.  Saundra Shelling M.D on 10/24/2017 at 2:28 PM  Between 7am to 6pm - Pager - [phone]  After 6pm go to [email or web address] - password [email or web address] [facility]  Office  [phone]  CC: Primary care physician; Rusty Aus, MD  Note: This dictation was prepared with Dragon dictation along with smaller phrase technology. Any transcriptional errors that result from this process are unintentional.

## 2017-10-24 NOTE — Anesthesia Preprocedure Evaluation (Signed)
Anesthesia Evaluation  Patient identified by MRN, date of birth, ID band Patient awake    Reviewed: Allergy & Precautions, H&P , NPO status , Patient's Chart, lab work & pertinent test results  History of Anesthesia Complications Negative for: history of anesthetic complications  Airway Mallampati: III  TM Distance: <3 FB Neck ROM: limited    Dental  (+) Chipped, Poor Dentition, Missing   Pulmonary neg pulmonary ROS, neg shortness of breath,           Cardiovascular Exercise Tolerance: Good hypertension, (-) angina+ Peripheral Vascular Disease  (-) Past MI and (-) DOE + pacemaker      Neuro/Psych  Neuromuscular disease negative psych ROS   GI/Hepatic negative GI ROS, Neg liver ROS, neg GERD  ,  Endo/Other  negative endocrine ROS  Renal/GU Renal disease  negative genitourinary   Musculoskeletal  (+) Arthritis ,   Abdominal   Peds  Hematology negative hematology ROS (+)   Anesthesia Other Findings Past Medical History: No date: Arthritis No date: Atrial fibrillation (HCC) No date: Blood clotting tendency (HCC) No date: Chronic kidney disease No date: DVT (deep venous thrombosis) (HCC) No date: Hypertension No date: Melanoma ([place]) No date: Pacemaker  Past Surgical History: No date: CHOLECYSTECTOMY 10/15/2014: COLONOSCOPY WITH PROPOFOL; N/A     Comment:  Procedure: COLONOSCOPY WITH PROPOFOL;  Surgeon: Josefine Class, MD;  Location: [facility] ENDOSCOPY;  Service:               Endoscopy;  Laterality: N/A; No date: INSERT / REPLACE / REMOVE PACEMAKER  BMI    Body Mass Index:  24.76 kg/m      Reproductive/Obstetrics negative OB ROS                             Anesthesia Physical Anesthesia Plan  ASA: III  Anesthesia Plan: General   Post-op Pain Management:    Induction: Intravenous  PONV Risk Score and Plan: Propofol infusion and TIVA  Airway Management  Planned: Natural Airway and Nasal Cannula  Additional Equipment:   Intra-op Plan:   Post-operative Plan:   Informed Consent: I have reviewed the patients History and Physical, chart, labs and discussed the procedure including the risks, benefits and alternatives for the proposed anesthesia with the patient or authorized representative who has indicated his/her understanding and acceptance.   Dental Advisory Given  Plan Discussed with: Anesthesiologist, CRNA and Surgeon  Anesthesia Plan Comments: (Patient consented for risks of anesthesia including but not limited to:  - adverse reactions to medications - risk of intubation if required - damage to teeth, lips or other oral mucosa - sore throat or hoarseness - Damage to heart, brain, lungs or loss of life  Patient voiced understanding.)        Anesthesia Quick Evaluation

## 2017-10-24 NOTE — Anesthesia Post-op Follow-up Note (Signed)
Anesthesia QCDR form completed.        

## 2017-10-25 ENCOUNTER — Encounter: Payer: Self-pay | Admitting: Gastroenterology

## 2017-10-25 ENCOUNTER — Encounter: Payer: Self-pay | Admitting: Oncology

## 2017-10-25 DIAGNOSIS — D649 Anemia, unspecified: Secondary | ICD-10-CM

## 2017-10-25 DIAGNOSIS — K641 Second degree hemorrhoids: Secondary | ICD-10-CM

## 2017-10-25 LAB — CBC
HCT: 24.2 % — ABNORMAL LOW (ref 40.0–52.0)
Hemoglobin: 8 g/dL — ABNORMAL LOW (ref 13.0–18.0)
MCH: 32.4 pg (ref 26.0–34.0)
MCHC: 32.9 g/dL (ref 32.0–36.0)
MCV: 98.3 fL (ref 80.0–100.0)
PLATELETS: 144 10*3/uL — AB (ref 150–440)
RBC: 2.46 MIL/uL — AB (ref 4.40–5.90)
RDW: 25.1 % — ABNORMAL HIGH (ref 11.5–14.5)
WBC: 7.7 10*3/uL (ref 3.8–10.6)

## 2017-10-25 LAB — PREPARE RBC (CROSSMATCH)

## 2017-10-25 LAB — HEMOGLOBIN AND HEMATOCRIT, BLOOD
HCT: 24.4 % — ABNORMAL LOW (ref 40.0–52.0)
Hemoglobin: 8.3 g/dL — ABNORMAL LOW (ref 13.0–18.0)

## 2017-10-25 MED ORDER — DOCUSATE SODIUM 100 MG PO CAPS
100.0000 mg | ORAL_CAPSULE | Freq: Two times a day (BID) | ORAL | Status: DC
  Administered 2017-10-25: 100 mg via ORAL
  Filled 2017-10-25: qty 1

## 2017-10-25 MED ORDER — SENNA 8.6 MG PO TABS
1.0000 | ORAL_TABLET | Freq: Two times a day (BID) | ORAL | Status: DC
  Administered 2017-10-25: 8.6 mg via ORAL
  Filled 2017-10-25: qty 1

## 2017-10-25 NOTE — Progress Notes (Signed)
Patient had blood leaking from rectum twice throughout shift. MD notified. Hemoglobin rechecked at midnight. No complaints of symptoms. Will monitor

## 2017-10-25 NOTE — Progress Notes (Addendum)
   Vonda Antigua, MD [address], [place], [place], [place], [address] [id number], [place], [place], [place], [address] Phone: [phone]  Fax: [phone]   Subjective: Patient's hemoglobin stable this morning. Reports had Bright red blood on toilet paper today. No abdominal pain   Objective: Exam: Vital signs in last 24 hours: Vitals:   10/24/17 1425 10/24/17 1612 10/25/17 0528 10/25/17 0824  BP: (!) 153/78 (!) 151/92 (!) 126/49 136/70  Pulse: 60 60 60 60  Resp: 20 17 13 16   Temp:  98.2 F (36.8 C) 98.2 F (36.8 C) 97.6 F (36.4 C)  TempSrc:    Oral  SpO2: 95% 97% 100% 99%  Weight:   161 lb 11.2 oz (73.3 kg)   Height:       Weight change: -23 lb 4.8 oz (-10.6 kg)  Intake/Output Summary (Last 24 hours) at 10/25/2017 1137 Last data filed at 10/25/2017 1042 Gross per 24 hour  Intake 680 ml  Output 500 ml  Net 180 ml    General: No acute distress, AAO x3 Abd: Soft, NT/ND, No HSM Skin: Warm, no rashes Neck: Supple, Trachea midline   Lab Results: Lab Results  Component Value Date   WBC 7.7 10/25/2017   HGB 8.0 (L) 10/25/2017   HCT 24.2 (L) 10/25/2017   MCV 98.3 10/25/2017   PLT 144 (L) 10/25/2017   Micro Results: No results found for this or any previous visit (from the past 240 hour(s)). Studies/Results: Dg Chest 2 View  Result Date: 10/23/2017 CLINICAL DATA:  Hypoxia status post colonoscopy. EXAM: CHEST - 2 VIEW COMPARISON:  Radiographs of December 12, 2016. FINDINGS: Stable cardiomediastinal silhouette. Left-sided pacemaker is unchanged in position. No pneumothorax or pleural effusion is noted. No acute pulmonary disease is noted. Degenerative changes are seen involving both glenohumeral joints. IMPRESSION: No active cardiopulmonary disease. Electronically Signed   By: Marijo Conception, M.D.   On: 10/23/2017 12:31   Medications:  Scheduled Meds: . cholecalciferol  1,000 Units Oral Daily  . docusate sodium  100 mg Oral BID  . ferrous sulfate  325  mg Oral Q breakfast  . pantoprazole (PROTONIX) IV  40 mg Intravenous Q12H  . senna  1 tablet Oral BID  . sodium chloride flush  3 mL Intravenous Q12H  . sodium chloride flush  3 mL Intravenous Q12H  . traMADol  100 mg Oral BID  . vitamin B-12  1,000 mcg Oral Daily   Continuous Infusions: . sodium chloride Stopped (10/24/17 1517)   PRN Meds:.sodium chloride, acetaminophen **OR** acetaminophen, ondansetron **OR** ondansetron (ZOFRAN) IV, senna-docusate, sodium chloride flush   Assessment: Active Problems:   Iron deficiency anemia secondary to blood loss (chronic)   Hypoxia   Hematochezia   Benign neoplasm of ascending colon   Acute posthemorrhagic anemia    Plan:  Surgery note pending, but pt and family state they evaluated the pt and they plan on follow up with them outpatient to evaluate for hemorrhoidectomy vs. Banding Continue high fiber diet and miralax daily Follow up with PCP in 1 week for Hgb recheck Follow up with surgery in 1-2 weeks Anemia due to hemorrhoid bleeding chronically Continue IV iron with hematology follow up    LOS: 2 days   Vonda Antigua, MD 10/25/2017, 11:37 AM

## 2017-10-25 NOTE — Consult Note (Signed)
SURGICAL CONSULTATION NOTE (initial) - cpt: 23536  HISTORY OF PRESENT ILLNESS (HPI):  84 y.o. male was referred to [facility] for admission after colonoscopy 2 days ago for evaluation of bright red blood per rectum. Patient and his daughter report patient has experienced bright red blood per rectum with chronic anemia (Hb 7 - 9) since 2014, confirmed in EMR to be present since at least 2016. During that time, patient has received regular iron infusions, but has not received any blood transfusion until 1 Unit PRBC transfused this admission. Patient otherwise reports +flatus and tolerating his diet, denies abdominal pain, peri-anal pain or fullness, fever/chills, CP, SOB, or dizziness, though he does intermittently use 1 L oxygen via [place] at home. On patient's recent colonoscopy, his hemorrhoids were not bleeding, but only a 4 mm ascending colon polyp was identified otherwise without diverticulosis. GI advised and patient's daughter accordingly requested surgical evaluation to discuss options for management of her father's chronic hemorrhoids.  Surgery is consulted by medical physician Dr. Estanislado Pandy in this context for evaluation and management of chronic hemorrhoids.  PAST MEDICAL HISTORY (PMH):  Past Medical History:  Diagnosis Date  . Arthritis   . Atrial fibrillation ([place])   . Blood clotting tendency ([place])   . Chronic kidney disease   . DVT (deep venous thrombosis) ([place])   . Hypertension   . Melanoma ([place])   . Pacemaker      PAST SURGICAL HISTORY [facility]):  Past Surgical History:  Procedure Laterality Date  . CHOLECYSTECTOMY    . COLONOSCOPY WITH PROPOFOL N/A 10/15/2014   Procedure: COLONOSCOPY WITH PROPOFOL;  Surgeon: Josefine Class, MD;  Location: [facility] ENDOSCOPY;  Service: Endoscopy;  Laterality: N/A;  . COLONOSCOPY WITH PROPOFOL N/A 10/23/2017   Procedure: COLONOSCOPY WITH PROPOFOL;  Surgeon: Lucilla Lame, MD;  Location: [facility] ENDOSCOPY;  Service: Endoscopy;  Laterality: N/A;  . INSERT /  REPLACE / REMOVE PACEMAKER       MEDICATIONS:  Prior to Admission medications   Medication Sig Start Date End Date Taking? Authorizing Provider  aspirin EC 81 MG tablet Take 81 mg by mouth daily.   Yes [provider]  Cholecalciferol (VITAMIN D3 PO) Take 1 tablet by mouth daily.   Yes [provider]  docusate sodium (COLACE) 100 MG capsule Take 100-200 mg by mouth daily.   Yes [provider]  furosemide (LASIX) 20 MG tablet Take by mouth. 04/25/17  Yes [provider]  Iron-Vitamin C 65-125 MG TABS Take 1-2 tablets by mouth daily.   Yes [provider]  LACTASE ENZYME PO Take 1 tablet by mouth as needed.   Yes [provider]  Na Sulfate-K Sulfate-Mg Sulf (SUPREP BOWEL PREP KIT) 17.5-3.13-1.6 GM/177ML SOLN Take 1 kit by mouth as directed. 10/10/17  Yes Lucilla Lame, MD  OMEGA-3 FATTY ACIDS PO Take 1-2 capsules by mouth daily.   Yes [provider]  RABEprazole (ACIPHEX) 20 MG tablet Take 20 mg by mouth daily.    Yes [provider]  SENNA LEAVES PO Take 470 mg by mouth daily.   Yes [provider]  traMADol (ULTRAM) 50 MG tablet Take 50 mg by mouth 2 (two) times daily.    Yes [provider]  vitamin B-12 (CYANOCOBALAMIN) 1000 MCG tablet Take 1,000 mcg by mouth daily.   Yes [provider]     ALLERGIES:  Allergies  Allergen Reactions  . Warfarin Other (See Comments)    Bleeding diff to stop     SOCIAL HISTORY:  Social History   Socioeconomic History  . Marital status: Widowed    Spouse name: Not on file  . Number of children: Not on file  . Years of education: Not on file  . Highest education level: Not on file  Occupational History  . Not on file  Social Needs  . Financial resource strain: Not on file  . Food insecurity:    Worry: Not on file    Inability: Not on file  . Transportation needs:    Medical: Not on file    Non-medical: Not on file  Tobacco Use  . Smoking  status: Never Smoker  . Smokeless tobacco: Never Used  Substance and Sexual Activity  . Alcohol use: No  . Drug use: Never  . Sexual activity: Not on file  Lifestyle  . Physical activity:    Days per week: Not on file    Minutes per session: Not on file  . Stress: Not on file  Relationships  . Social connections:    Talks on phone: Not on file    Gets together: Not on file    Attends religious service: Not on file    Active member of club or organization: Not on file    Attends meetings of clubs or organizations: Not on file    Relationship status: Not on file  . Intimate partner violence:    Fear of current or ex partner: Not on file    Emotionally abused: Not on file    Physically abused: Not on file    Forced sexual activity: Not on file  Other Topics Concern  . Not on file  Social History Narrative  . Not on file    The patient currently resides (home / rehab facility / nursing home): Home The patient normally is (ambulatory / bedbound): Limited ambulation   FAMILY HISTORY:  Family History  Problem Relation Age of Onset  . CAD Father   . Prostate cancer Brother   . Breast cancer Sister      REVIEW OF SYSTEMS:  Constitutional: denies weight loss, fever, chills, or sweats  Eyes: denies any other vision changes, history of eye injury  ENT: denies sore throat, hearing problems  Respiratory: denies shortness of breath, wheezing  Cardiovascular: denies chest pain, palpitations  Gastrointestinal: abdominal pain, N/V, and bowel function as per HPI Genitourinary: denies burning with urination or urinary frequency Musculoskeletal: denies any other joint pains or cramps  Skin: denies any other rashes or skin discolorations  Neurological: denies any other headache, dizziness, weakness  Psychiatric: denies any other depression, anxiety   All other review of systems were negative   VITAL SIGNS:  Temp:  [97.6 F (36.4 C)-98.6 F (37 C)] 97.6 F (36.4 C) (05/23  0824) Pulse Rate:  [57-60] 60 (05/23 0824) Resp:  [13-22] 16 (05/23 0824) BP: (123-153)/(49-92) 136/70 (05/23 0824) SpO2:  [95 %-100 %] 99 % (05/23 0824) Weight:  [161 lb 11.2 oz (73.3 kg)] 161 lb 11.2 oz (73.3 kg) (05/23 0528)     Height: '5\' 11"'$  (180.3 cm) Weight: 161 lb 11.2 oz (73.3 kg) BMI (Calculated): 22.56   INTAKE/OUTPUT:  This shift: No intake/output data recorded.  Last 2 shifts: '@IOLAST2SHIFTS'$ @   PHYSICAL EXAM:  Constitutional:  -- Normal body habitus  -- Awake, alert, and oriented x3, no apparent distress Eyes:  -- Pupils equally round and reactive to light  -- No scleral icterus, B/L no occular discharge Ear, nose, throat: -- Neck is FROM WNL -- No jugular  venous distension  Pulmonary:  -- No wheezes or rhales -- Equal breath sounds bilaterally -- Breathing non-labored at rest Cardiovascular:  -- S1, S2 present  -- No pericardial rubs  Gastrointestinal:  -- Abdomen soft, nontender, non-distended, no guarding or rebound tenderness -- No abdominal masses appreciated, pulsatile or otherwise Anorectal: -- Currently no gross blood, non-prolapsed non-bleeding non-tender hemorrhoids, anal sphincter tone WNL Musculoskeletal and Integumentary:  -- Wounds or skin discoloration: None appreciated -- Extremities: B/L UE and LE FROM, hands and feet warm, no edema  Neurologic:  -- Motor function: Intact and symmetric -- Sensation: Intact and symmetric Psychiatric:  -- Mood and affect WNL  Labs:  CBC Latest Ref Rng & Units 10/25/2017 10/25/2017 10/24/2017  WBC 3.8 - 10.6 K/uL 7.7 - -  Hemoglobin 13.0 - 18.0 g/dL 8.0(L) 8.3(L) 9.3(L)  Hematocrit 40.0 - 52.0 % 24.2(L) 24.4(L) 28.5(L)  Platelets 150 - 440 K/uL 144(L) - -   CMP Latest Ref Rng & Units 10/24/2017 10/23/2017 12/12/2016  Glucose 65 - 99 mg/dL 89 99 102(H)  BUN 6 - 20 mg/dL 20 20 24(H)  Creatinine 0.61 - 1.24 mg/dL 1.73(H) 1.79(H) 1.58(H)  Sodium 135 - 145 mmol/L 142 144 141  Potassium 3.5 - 5.1 mmol/L 4.2 4.0 4.7   Chloride 101 - 111 mmol/L 113(H) 112(H) 107  CO2 22 - 32 mmol/L '24 23 26  '$ Calcium 8.9 - 10.3 mg/dL 8.6(L) 8.5(L) 9.6  Total Protein 6.5 - 8.1 g/dL - 5.7(L) -  Total Bilirubin 0.3 - 1.2 mg/dL - 0.8 -  Alkaline Phos 38 - 126 U/L - 64 -  AST 15 - 41 U/L - 23 -  ALT 17 - 63 U/L - 13(L) -   Imaging studies:  Colonoscopy (10/23/2017) - One 4 mm polyp in the ascending colon, removed with a cold snare. Resected and retrieved. - Non-bleeding Grade 2 (spontaneously reducing) internal hemorrhoids. - No diverticulosis seen.  Esophagogastricduodenoscopy (10/24/2017) - Normal esophagus. - Normal stomach. - Normal duodenal bulb, second portion of the duodenum and examined duodenum. - No specimens collected. - Patient's anemia must be from his hemorrhoids as no other source was identified on today's exam.  Assessment/Plan: (ICD-10's: D64.9, K64.1) 84 y.o. male with stable chronic anemia attributed by GI to patient's also chronic grade 2 hemorrhoids, visualized without evidence of active bleeding, on colonoscopy yesterday, complicated by pertinent comorbidities including advanced chronological age, HTN, CAD, implanted pacemaker, atrial fibrillation off anticoagulation x 3 years due to chronic GI bleeding, DVT several years ago also off anticoagulation x 3 years due to chronic GI bleeding (no unilateral lower extremity pain or swelling), chronic hypoxia (COPD?) on intermittent home oxygen (1 L via [place]), CKD, and a history of melanoma.   - monitor Hb and transfuse as needed  - discussed with patient and his daughter management options including 1.) continued monitoring with iron infusions prn (which patient states to be his preference) vs 2.) hemorrhoidal banding (needs to be discussed with GI whether feasible) vs 3.) hemorrhoidectomy (possibly spinal anesthesia after discussion with anesthesia rather than GETA considering pulmonary status)  - considering chronicity, may follow-up outpatient to discuss  further  - continue medical management of patient's comorbidities  - DVT prophylaxis  All of the above findings and recommendations were discussed with the patient, his daughter, and medical physician (Dr. Estanislado Pandy), and all of patient's and his family's questions were answered to their expressed satisfaction.  Thank you for the opportunity to participate in this patient's care.   -- Marilynne Drivers Rosana Hoes,  MD, [place]: [facility] Partnering for exceptional care. Office: [phone]

## 2017-10-26 ENCOUNTER — Telehealth: Payer: Self-pay | Admitting: Gastroenterology

## 2017-10-26 ENCOUNTER — Encounter: Payer: Self-pay | Admitting: Gastroenterology

## 2017-10-26 LAB — BPAM RBC
BLOOD PRODUCT EXPIRATION DATE: 201905282359
BLOOD PRODUCT EXPIRATION DATE: 201905302359
BLOOD PRODUCT EXPIRATION DATE: 201906012359
Blood Product Expiration Date: 201906032359
ISSUE DATE / TIME: 201905221042
ISSUE DATE / TIME: 201905211408
UNIT TYPE AND RH: 600
UNIT TYPE AND RH: 6200
Unit Type and Rh: 600
Unit Type and Rh: 6200

## 2017-10-26 LAB — TYPE AND SCREEN
ABO/RH(D): A POS
Antibody Screen: NEGATIVE
UNIT DIVISION: 0
UNIT DIVISION: 0
UNIT DIVISION: 0
Unit division: 0

## 2017-10-26 LAB — PREPARE RBC (CROSSMATCH)

## 2017-10-26 NOTE — Telephone Encounter (Signed)
Pt daughter Leann eft vm concerning her dads bleeding would like a call

## 2017-10-26 NOTE — Telephone Encounter (Signed)
Patients daughter Howard Harmon called.  She expressed concerns regarding her father is currently experiencing bleeding, and worries that his hgb may be dropping due to the amount of blood loss.    Discussed concerns with Dr. Bonna Gains and she advised the following: If patient is bleeding more out of the ordinary go to the ER/Urgent Care or contact his PCP to check blood work to make sure hgb hasn't dropped.  If this bleeding is not out of the ordinary to keep the outpatient banding appt with Dr. Marius Ditch.  Howard Harmon stated that her father would not go to ER or urgent care.  I asked her to try calling the paramedics to see if they could stop by to do a hgb.  She said his hgb was 8 after 2 units of blood yesterday. He just refuses to go to ER.    Thanks [employer]

## 2017-10-29 NOTE — Discharge Summary (Addendum)
[facility] NAME: Howard Harmon    MR#:  [id number]  DATE OF BIRTH:  03/27/1930  DATE OF ADMISSION:  10/23/2017 ADMITTING PHYSICIAN: Lucilla Lame, MD  DATE OF DISCHARGE: 10/25/2017  4:00 PM  PRIMARY CARE PHYSICIAN: Rusty Aus, MD   ADMISSION DIAGNOSIS:  Rectal bleeding K62.5 Symptomatic Anemia Hypoxia Bradycardia Hemorroids Iron Deficiency Anemia Chronic Atrial Fibrillation H/O DVT  DISCHARGE DIAGNOSIS:  Symptomatic Anemia Iron deficiency anemia from chronic blood loss Hemorroids Chronic atrial fibrillation H/O DVT  SECONDARY DIAGNOSIS:   Past Medical History:  Diagnosis Date  . Arthritis   . Atrial fibrillation ([place])   . Blood clotting tendency ([place])   . Chronic kidney disease   . DVT (deep venous thrombosis) ([place])   . Hypertension   . Melanoma ([place])   . Pacemaker      ADMITTING HISTORY Howard Harmon  is a 84 y.o. male with a known history of atrial fibrillation, chronic kidney disease, DVT not on anticoagulation secondary to history of GI bleed, pacemaker was referred from endoscopy unit for low heart rate and low oxygen saturation.  Patient came from home for outpatient colonoscopy.  Patient had a low oxygen levels and his heart rate was around 40 bpm.  Patient was put on oxygen via nasal cannula.  No complaints of any chest pain no fever and chills colonoscopy was done and polyp was removed.  Laboratory work-up done in the endoscopy suite showed hemoglobin of 7.  1 unit PRBC IV transfusion was ordered patient was found to have hemorrhoids    HOSPITAL COURSE:  Patient admitted to telemetry. Patient received PRBC transfusions and tolerated well.His bradycardia resolved. Serial troponin were checked and ischemia ruled out.Chest x ray did not reveal any pneumonia. Patient was seen by Gastroenterology for anemia. He had colonoscopy which revealed polyps which were removed. He underwent EGD and was a normal study. No  bleeding ulcers or erosions noted. Patients hemoglobin stable after prbc transfusions. Patient was evaluated by general surgery for hemorrhoids and patient refused any surgical procedure for now for the hemorrhoids. Patient will be discharged home with GI appointment for out patient banding of hemorrhoids. Patient weaned off oxygen.  CONSULTS OBTAINED:  Treatment Team:  Virgel Manifold, MD Andres Labrum, MD Vickie Epley, MD  DRUG ALLERGIES:   Allergies  Allergen Reactions  . Warfarin Other (See Comments)    Bleeding diff to stop    DISCHARGE MEDICATIONS:   Allergies as of 10/25/2017      Reactions   Warfarin Other (See Comments)   Bleeding diff to stop      Medication List    STOP taking these medications   aspirin EC 81 MG tablet   Na Sulfate-K Sulfate-Mg Sulf 17.5-3.13-1.6 GM/177ML Soln Commonly known as:  SUPREP BOWEL PREP KIT     TAKE these medications   docusate sodium 100 MG capsule Commonly known as:  COLACE Take 100-200 mg by mouth daily.   furosemide 20 MG tablet Commonly known as:  LASIX Take by mouth.   Iron-Vitamin C 65-125 MG Tabs Take 1-2 tablets by mouth daily.   LACTASE ENZYME PO Take 1 tablet by mouth as needed.   OMEGA-3 FATTY ACIDS PO Take 1-2 capsules by mouth daily.   RABEprazole 20 MG tablet Commonly known as:  ACIPHEX Take 20 mg by mouth daily.   SENNA LEAVES PO Take 470 mg by mouth daily.   traMADol 50 MG tablet Commonly known  as:  ULTRAM Take 50 mg by mouth 2 (two) times daily.   vitamin B-12 1000 MCG tablet Commonly known as:  CYANOCOBALAMIN Take 1,000 mcg by mouth daily.   VITAMIN D3 PO Take 1 tablet by mouth daily.       Today  Patient seen and evaluated on day of discharge No chest pain No shortness of breath Weaned off oxygen  VITAL SIGNS:  Blood pressure 136/70, pulse 60, temperature 97.6 F (36.4 C), temperature source Oral, resp. rate 16, height _0  (1.803 m), weight 73.3 kg (161 lb 11.2 oz),  SpO2 99 %.  I/O:  No intake or output data in the 24 hours ending 10/29/17 1424  PHYSICAL EXAMINATION:  Physical Exam  GENERAL:  84 y.o.-year-old patient lying in the bed with no acute distress.  LUNGS: Normal breath sounds bilaterally, no wheezing, rales,rhonchi or crepitation. No use of accessory muscles of respiration.  CARDIOVASCULAR: S1, S2 normal. No murmurs, rubs, or gallops.  ABDOMEN: Soft, non-tender, non-distended. Bowel sounds present. No organomegaly or mass.  NEUROLOGIC: Moves all 4 extremities. PSYCHIATRIC: The patient is alert and oriented x 3.  SKIN: No obvious rash, lesion, or ulcer.   DATA REVIEW:   CBC Recent Labs  Lab 10/25/17 0440  WBC 7.7  HGB 8.0*  HCT 24.2*  PLT 144*    Chemistries  Recent Labs  Lab 10/23/17 1442 10/24/17 0603  NA 144 142  K 4.0 4.2  CL 112* 113*  CO2 23 24  GLUCOSE 99 89  BUN 20 20  CREATININE 1.79* 1.73*  CALCIUM 8.5* 8.6*  AST 23  --   ALT 13*  --   ALKPHOS 64  --   BILITOT 0.8  --     Cardiac Enzymes Recent Labs  Lab 10/23/17 2026  TROPONINI 0.05*    Microbiology Results  No results found for this or any previous visit.  RADIOLOGY:  No results found.  Follow up with PCP in 1 week.  Management plans discussed with the patient, family and they are in agreement.  CODE STATUS: Full code Code Status History    Date Active Date Inactive Code Status Order ID Comments User Context   10/23/2017 1043 10/25/2017 1923 Full Code [id number]  Saundra Shelling, MD Inpatient   01/01/2015 1104 01/02/2015 1539 Full Code [id number]  Vaughan Basta, MD Inpatient   10/12/2014 1731 10/15/2014 1922 Full Code [id number]  Demetrios Loll, MD ED      TOTAL TIME TAKING CARE OF THIS PATIENT ON DAY OF DISCHARGE: more than 35 minutes.   Saundra Shelling M.D on 10/29/2017 at 2:24 PM  Between 7am to 6pm - Pager - [phone]  After 6pm go to [email or web address] - password [email or web address] [facility]  Office   [phone]  CC: Primary care physician; Rusty Aus, MD  Note: This dictation was prepared with Dragon dictation along with smaller phrase technology. Any transcriptional errors that result from this process are unintentional.

## 2017-10-30 ENCOUNTER — Encounter: Payer: Self-pay | Admitting: Gastroenterology

## 2017-10-31 ENCOUNTER — Telehealth: Payer: Self-pay

## 2017-10-31 NOTE — Telephone Encounter (Signed)
No answer when I tried to contact patients daughter for check in, and appt reschedule for this week.

## 2017-11-01 ENCOUNTER — Telehealth: Payer: Self-pay | Admitting: Gastroenterology

## 2017-11-01 NOTE — Telephone Encounter (Signed)
Contacted patients daughter Otho Perl. She said that Dr. Peyton Najjar Diaz's office contacted her this morning and he has had hemorrhoids banded already.  She has already canceled her appt with [place].  Thanks [employer]

## 2017-11-01 NOTE — Telephone Encounter (Signed)
Called pt to get scheduled, per Daughter pt is seeing another Doctor for the issue and she cancelled all apt with [place].

## 2017-11-02 ENCOUNTER — Other Ambulatory Visit: Payer: Self-pay | Admitting: *Deleted

## 2017-11-02 DIAGNOSIS — D509 Iron deficiency anemia, unspecified: Secondary | ICD-10-CM

## 2017-11-03 NOTE — Progress Notes (Signed)
[place]  [phone]) [phone] [phone]) [phone]  ID: Billie Lade OB: 1929/09/15  MR#: [id number]  ERX#:[id number]  Patient Care Team: Rusty Aus, MD as PCP - General (Internal Medicine)  CHIEF COMPLAINT: Iron deficiency anemia.  INTERVAL HISTORY: Patient returns to clinic today for repeat laboratory work and further evaluation.  He recently was placed on antibiotics for symptoms of UTI.  He recently was discharged from the hospital with symptomatic anemia possibly secondary to internal hemorrhoids.  He received 2 units of packed red blood cells as well as IV iron.  He continues to have chronic weakness and fatigue. He has no neurologic complaints. He denies any recent fevers or illnesses. He has no chest pain or shortness of breath. He denies any nausea, vomiting, constipation, or diarrhea. He has no melena or hematochezia. He has no urinary complaints.  Patient offers no further specific complaints today.  REVIEW OF SYSTEMS:   Review of Systems  Constitutional: Positive for malaise/fatigue. Negative for fever and weight loss.  Respiratory: Negative.  Negative for cough and shortness of breath.   Cardiovascular: Negative.  Negative for chest pain and leg swelling.  Gastrointestinal: Negative.  Negative for abdominal pain, blood in stool and melena.  Genitourinary: Negative.  Negative for hematuria.  Musculoskeletal: Positive for joint pain.  Skin: Negative.  Negative for rash.  Neurological: Positive for weakness. Negative for sensory change and focal weakness.  Psychiatric/Behavioral: Negative.  The patient is not nervous/anxious.     As per HPI. Otherwise, a complete review of systems is negative.  PAST MEDICAL HISTORY: Past Medical History:  Diagnosis Date  . Arthritis   . Atrial fibrillation ([place])   . Blood clotting tendency ([place])   . Chronic kidney disease   . DVT (deep venous thrombosis) ([place])   . Hypertension   . Melanoma ([place])   .  Pacemaker     PAST SURGICAL HISTORY: Past Surgical History:  Procedure Laterality Date  . CHOLECYSTECTOMY    . COLONOSCOPY WITH PROPOFOL N/A 10/15/2014   Procedure: COLONOSCOPY WITH PROPOFOL;  Surgeon: Josefine Class, MD;  [facility] END[facility]COPY;  Service: Endoscopy;  Laterality: N/A;  . COLONOSCOPY WITH PROPOFOL N/A 10/23/2017   Procedure: COLONOSCOPY WITH PROPOFOL;  Surgeon: Lucilla Lame, MD;  Location: [facility] ENDOSCOPY;  Service: Endoscopy;  Laterality: N/A;  . ESOPHAGOGASTRODUODENOSCOPY N/A 10/24/2017   Procedure: ESOPHAGOGASTRODUODENOSCOPY (EGD);  Surgeon: Virgel Manifold, MD;  Location[facility] ENDOSCOPY;  Service: Endoscopy;  Laterality: N/A;  . INSERT / REPLACE / REMOVE PACEMAKER      FAMILY HISTORY: Family History  Problem Relation Age of Onset  . CAD Father   . Prostate cancer Brother   . Breast cancer Sister     ADVANCED DIRECTIVES (Y/N):  N  HEALTH MAINTENANCE: Social History   Tobacco Use  . Smoking status: Never Smoker  . Smokeless tobacco: Never Used  Substance Use Topics  . Alcohol use: No  . Drug use: Never     Colonoscopy:  PAP:  Bone density:  Lipid panel:  Allergies  Allergen Reactions  . Warfarin Other (See Comments)    Bleeding diff to stop    Current Outpatient Medications  Medication Sig Dispense Refill  . Cholecalciferol (VITAMIN D3 PO) Take 1 tablet by mouth daily.    Marland Kitchen docusate sodium (COLACE) 100 MG capsule Take 100-200 mg by mouth daily.    . OMEGA-3 FATTY ACIDS PO Take 1-2 capsules by mouth daily.    . RABEprazole (ACIPHEX) 20 MG tablet Take  20 mg by mouth daily.     Jordan Hawks LEAVES PO Take 470 mg by mouth daily.    . traMADol (ULTRAM) 50 MG tablet Take 100 mg by mouth 2 (two) times daily.     . vitamin B-12 (CYANOCOBALAMIN) 1000 MCG tablet Take 1,000 mcg by mouth daily.    . furosemide (LASIX) 20 MG tablet Take by mouth.    . Iron-Vitamin C 65-125 MG TABS Take 1-2 tablets by mouth daily.    Marland Kitchen LACTASE ENZYME PO Take 1 tablet  by mouth as needed.     No current facility-administered medications for this visit.     OBJECTIVE: Vitals:   11/05/17 1348  BP: 103/62  Pulse: 61  Resp: 18  Temp: 98.2 F (36.8 C)     Body mass index is 25.13 kg/m.    ECOG FS:1 - Symptomatic but completely ambulatory  General: Well-developed, well-nourished, no acute distress. Eyes: Pink conjunctiva, anicteric sclera. Lungs: Clear to auscultation bilaterally. Heart: Regular rate and rhythm. No rubs, murmurs, or gallops. Abdomen: Soft, nontender, nondistended. No organomegaly noted, normoactive bowel sounds. Musculoskeletal: No edema, cyanosis, or clubbing. Neuro: Alert, answering all questions appropriately. Cranial nerves grossly intact. Skin: No rashes or petechiae noted. Psych: Normal affect.  LAB RESULTS:  Lab Results  Component Value Date   NA 142 10/24/2017   K 4.2 10/24/2017   CL 113 (H) 10/24/2017   CO2 24 10/24/2017   GLUCOSE 89 10/24/2017   BUN 20 10/24/2017   CREATININE 1.73 (H) 10/24/2017   CALCIUM 8.6 (L) 10/24/2017   PROT 5.7 (L) 10/23/2017   ALBUMIN 3.4 (L) 10/23/2017   AST 23 10/23/2017   ALT 13 (L) 10/23/2017   ALKPHOS 64 10/23/2017   BILITOT 0.8 10/23/2017   GFRNONAA 34 (L) 10/24/2017   GFRAA 40 (L) 10/24/2017    Lab Results  Component Value Date   WBC 9.2 11/05/2017   NEUTROABS 6.8 (H) 11/05/2017   HGB 8.5 (L) 11/05/2017   HCT 25.8 (L) 11/05/2017   MCV 99.7 11/05/2017   PLT 197 11/05/2017   Lab Results  Component Value Date   IRON 39 (L) 11/05/2017   TIBC 317 11/05/2017   IRONPCTSAT 12 (L) 11/05/2017   Lab Results  Component Value Date   FERRITIN 43 11/05/2017     STUDIES: Dg Chest 2 View  Result Date: 10/23/2017 CLINICAL DATA:  Hypoxia status post colonoscopy. EXAM: CHEST - 2 VIEW COMPARISON:  Radiographs of December 12, 2016. FINDINGS: Stable cardiomediastinal silhouette. Left-sided pacemaker is unchanged in position. No pneumothorax or pleural effusion is noted. No acute  pulmonary disease is noted. Degenerative changes are seen involving both glenohumeral joints. IMPRESSION: No active cardiopulmonary disease. Electronically Signed   By: Marijo Conception, M.D.   On: 10/23/2017 12:31    ASSESSMENT: Iron deficiency anemia  PLAN:   1. Iron deficiency anemia: Patient reports he cannot tolerate oral iron supplementation.  Patient's hemoglobin has improved since discharge, but still significantly decreased.  He also has decreased iron stores.  Patient had EGD as well as colonoscopy on May 21 and 22, 2019.  Will proceed with 510 mg of IV Feraheme today.  Return to clinic in 6 weeks with repeat laboratory work and further evaluation.   2. Thrombocytopenia: Patient's platelets are currently within normal limits. 3. Elevated MCV: Resolved.  Previously, B-12 and folate were within normal limits.  Given his iron deficiency anemia, he may have a component of MDS which would require a bone marrow biopsy to  diagnose.  This is not necessary at this time.  Monitor. 4. Hypertension: Patient's blood pressure is within normal limits today.  Approximately 30 minutes was spent in discussion of which greater than 50% was consultation.   Patient expressed understanding and was in agreement with this plan. He also understands that He can call clinic at any time with any questions, concerns, or complaints.    Lloyd Huger, MD 11/07/17 9:16 PM

## 2017-11-05 ENCOUNTER — Inpatient Hospital Stay: Payer: Medicare Other

## 2017-11-05 ENCOUNTER — Inpatient Hospital Stay: Payer: Medicare Other | Attending: Oncology

## 2017-11-05 ENCOUNTER — Inpatient Hospital Stay (HOSPITAL_BASED_OUTPATIENT_CLINIC_OR_DEPARTMENT_OTHER): Payer: Medicare Other | Admitting: Oncology

## 2017-11-05 ENCOUNTER — Other Ambulatory Visit: Payer: Self-pay

## 2017-11-05 VITALS — BP 103/62 | HR 61 | Temp 98.2°F | Resp 18 | Wt 180.2 lb

## 2017-11-05 DIAGNOSIS — K641 Second degree hemorrhoids: Secondary | ICD-10-CM

## 2017-11-05 DIAGNOSIS — D509 Iron deficiency anemia, unspecified: Secondary | ICD-10-CM | POA: Diagnosis present

## 2017-11-05 DIAGNOSIS — K648 Other hemorrhoids: Secondary | ICD-10-CM

## 2017-11-05 DIAGNOSIS — D696 Thrombocytopenia, unspecified: Secondary | ICD-10-CM

## 2017-11-05 DIAGNOSIS — D500 Iron deficiency anemia secondary to blood loss (chronic): Secondary | ICD-10-CM

## 2017-11-05 DIAGNOSIS — I10 Essential (primary) hypertension: Secondary | ICD-10-CM | POA: Diagnosis not present

## 2017-11-05 DIAGNOSIS — D649 Anemia, unspecified: Secondary | ICD-10-CM

## 2017-11-05 LAB — CBC WITH DIFFERENTIAL/PLATELET
BASOS ABS: 0 10*3/uL (ref 0–0.1)
BASOS PCT: 0 %
EOS PCT: 3 %
Eosinophils Absolute: 0.3 10*3/uL (ref 0–0.7)
HCT: 25.8 % — ABNORMAL LOW (ref 40.0–52.0)
Hemoglobin: 8.5 g/dL — ABNORMAL LOW (ref 13.0–18.0)
Lymphocytes Relative: 13 %
Lymphs Abs: 1.2 10*3/uL (ref 1.0–3.6)
MCH: 32.9 pg (ref 26.0–34.0)
MCHC: 33 g/dL (ref 32.0–36.0)
MCV: 99.7 fL (ref 80.0–100.0)
MONO ABS: 0.8 10*3/uL (ref 0.2–1.0)
Monocytes Relative: 9 %
NEUTROS ABS: 6.8 10*3/uL — AB (ref 1.4–6.5)
Neutrophils Relative %: 75 %
PLATELETS: 197 10*3/uL (ref 150–440)
RBC: 2.58 MIL/uL — AB (ref 4.40–5.90)
RDW: 21.8 % — AB (ref 11.5–14.5)
WBC: 9.2 10*3/uL (ref 3.8–10.6)

## 2017-11-05 LAB — IRON AND TIBC
IRON: 39 ug/dL — AB (ref 45–182)
Saturation Ratios: 12 % — ABNORMAL LOW (ref 17.9–39.5)
TIBC: 317 ug/dL (ref 250–450)
UIBC: 278 ug/dL

## 2017-11-05 LAB — FERRITIN: Ferritin: 43 ng/mL (ref 24–336)

## 2017-11-05 MED ORDER — SODIUM CHLORIDE 0.9 % IV SOLN
INTRAVENOUS | Status: DC
  Administered 2017-11-05: 15:00:00 via INTRAVENOUS
  Filled 2017-11-05: qty 1000

## 2017-11-05 MED ORDER — FERUMOXYTOL INJECTION 510 MG/17 ML
510.0000 mg | Freq: Once | INTRAVENOUS | Status: AC
  Administered 2017-11-05: 510 mg via INTRAVENOUS
  Filled 2017-11-05: qty 17

## 2017-11-05 NOTE — Progress Notes (Signed)
Here for follow up. Had urine c &s today at [facility] -told he has UTI -ABX to start today per daughter

## 2017-11-16 ENCOUNTER — Ambulatory Visit: Payer: Medicare Other | Admitting: Gastroenterology

## 2017-11-27 ENCOUNTER — Ambulatory Visit: Payer: Medicare Other | Admitting: Oncology

## 2017-11-27 ENCOUNTER — Ambulatory Visit: Payer: Medicare Other

## 2017-11-27 ENCOUNTER — Other Ambulatory Visit: Payer: Medicare Other

## 2017-12-09 NOTE — Progress Notes (Signed)
[place]  [phone] Fax:(336) [phone]  ID: Howard Harmon OB: 1930-05-01  MR#: [id number]  JME#:[id number]  Patient Care Team: Rusty Aus, MD as PCP - General (Internal Medicine)  CHIEF COMPLAINT: Iron deficiency anemia.  INTERVAL HISTORY: Patient returns to clinic today for repeat laboratory work and further evaluation.  He continues to have weakness and fatigue, but states this has improved over the past 4 to 6 weeks.  He otherwise feels well and is at his baseline. He has no neurologic complaints. He denies any recent fevers or illnesses. He has no chest pain or shortness of breath. He denies any nausea, vomiting, constipation, or diarrhea. He has no melena or hematochezia. He has no urinary complaints.  Patient offers no further specific complaints today.  REVIEW OF SYSTEMS:   Review of Systems  Constitutional: Positive for malaise/fatigue. Negative for fever and weight loss.  Respiratory: Negative.  Negative for cough and shortness of breath.   Cardiovascular: Negative.  Negative for chest pain and leg swelling.  Gastrointestinal: Negative.  Negative for abdominal pain, blood in stool and melena.  Genitourinary: Negative.  Negative for hematuria.  Musculoskeletal: Positive for joint pain.  Skin: Negative.  Negative for rash.  Neurological: Positive for weakness. Negative for sensory change and focal weakness.  Psychiatric/Behavioral: Negative.  The patient is not nervous/anxious.     As per HPI. Otherwise, a complete review of systems is negative.  PAST MEDICAL HISTORY: Past Medical History:  Diagnosis Date  . Arthritis   . Atrial fibrillation ([place])   . Blood clotting tendency ([place])   . Chronic kidney disease   . DVT (deep venous thrombosis) ([place])   . Hypertension   . Melanoma ([place])   . Pacemaker     PAST SURGICAL HISTORY: Past Surgical History:  Procedure Laterality Date  . CHOLECYSTECTOMY    . COLONOSCOPY WITH PROPOFOL N/A  10/15/2014   Procedure: COLONOSCOPY WITH PROPOFOL;  Surgeon: Josefine Class, MD;  Location[facility] ENDOSCOPY;  Service: Endoscopy;  Laterality: N/A;  . COLONOSCOPY WITH PROPOFOL N/A 10/23/2017   Procedure: COLONOSCOPY WITH PROPOFOL;  Surgeon: Lucilla Lame, MD;  Location: [facility] ENDOSCOPY;  Service: Endoscopy;  Laterality: N/A;  . ESOPHAGOGASTRODUODENOSCOPY N/A 10/24/2017   Procedure: ESOPHAGOGASTRODUODENOSCOPY (EGD);  Surgeon: Virgel Manifold, MD;  Location: [facility] ENDOSCOPY;  Service: Endoscopy;  Laterality: N/A;  . INSERT / REPLACE / REMOVE PACEMAKER      FAMILY HISTORY: Family History  Problem Relation Age of Onset  . CAD Father   . Prostate cancer Brother   . Breast cancer Sister     ADVANCED DIRECTIVES (Y/N):  N  HEALTH MAINTENANCE: Social History   Tobacco Use  . Smoking status: Never Smoker  . Smokeless tobacco: Never Used  Substance Use Topics  . Alcohol use: No  . Drug use: Never     Colonoscopy:  PAP:  Bone density:  Lipid panel:  Allergies  Allergen Reactions  . Warfarin Other (See Comments)    Bleeding diff to stop    Current Outpatient Medications  Medication Sig Dispense Refill  . Cholecalciferol (VITAMIN D3 PO) Take 1 tablet by mouth daily.    Marland Kitchen docusate sodium (COLACE) 100 MG capsule Take 100-200 mg by mouth daily.    . furosemide (LASIX) 20 MG tablet Take 20 mg by mouth.     . OMEGA-3 FATTY ACIDS PO Take 1-2 capsules by mouth 2 (two) times daily.     . RABEprazole (ACIPHEX) 20 MG tablet Take 20 mg  by mouth daily.     Jordan Hawks LEAVES PO Take 470 mg by mouth daily.    . traMADol (ULTRAM) 50 MG tablet Take 100 mg by mouth 2 (two) times daily.     . vitamin B-12 (CYANOCOBALAMIN) 1000 MCG tablet Take 1,000 mcg by mouth daily.    . Iron-Vitamin C 65-125 MG TABS Take 1-2 tablets by mouth daily.    Marland Kitchen LACTASE ENZYME PO Take 1 tablet by mouth as needed.     No current facility-administered medications for this visit.     OBJECTIVE: Vitals:   12/11/17  1124  BP: 112/70  Pulse: 71  Resp: 18  Temp: (!) 96.9 F (36.1 C)     Body mass index is 24.69 kg/m.    ECOG FS:1 - Symptomatic but completely ambulatory  General: Well-developed, well-nourished, no acute distress. Eyes: Pink conjunctiva, anicteric sclera. Lungs: Clear to auscultation bilaterally. Heart: Regular rate and rhythm. No rubs, murmurs, or gallops. Abdomen: Soft, nontender, nondistended. No organomegaly noted, normoactive bowel sounds. Musculoskeletal: No edema, cyanosis, or clubbing. Neuro: Alert, answering all questions appropriately. Cranial nerves grossly intact. Skin: No rashes or petechiae noted. Psych: Normal affect.  LAB RESULTS:  Lab Results  Component Value Date   NA 142 10/24/2017   K 4.2 10/24/2017   CL 113 (H) 10/24/2017   CO2 24 10/24/2017   GLUCOSE 89 10/24/2017   BUN 20 10/24/2017   CREATININE 1.73 (H) 10/24/2017   CALCIUM 8.6 (L) 10/24/2017   PROT 5.7 (L) 10/23/2017   ALBUMIN 3.4 (L) 10/23/2017   AST 23 10/23/2017   ALT 13 (L) 10/23/2017   ALKPHOS 64 10/23/2017   BILITOT 0.8 10/23/2017   GFRNONAA 34 (L) 10/24/2017   GFRAA 40 (L) 10/24/2017    Lab Results  Component Value Date   WBC 7.9 12/11/2017   NEUTROABS 5.8 12/11/2017   HGB 11.9 (L) 12/11/2017   HCT 35.5 (L) 12/11/2017   MCV 99.5 12/11/2017   PLT 162 12/11/2017   Lab Results  Component Value Date   IRON 69 12/11/2017   TIBC 317 12/11/2017   IRONPCTSAT 22 12/11/2017   Lab Results  Component Value Date   FERRITIN 33 12/11/2017     STUDIES: No results found.  ASSESSMENT: Iron deficiency anemia  PLAN:   1. Iron deficiency anemia: Patient's hemoglobin is 11.9 and his iron stores are now within normal limits.  Patient reports he cannot tolerate oral iron supplementation.  He denies any further rectal bleeding.  Patient had EGD as well as colonoscopy on May 21 and 22, 2019.  He does not require additional Feraheme today.  Patient last received treatment on November 05, 2017.   Return to clinic in 3 months with repeat laboratory work and further evaluation.  2. Thrombocytopenia: Resolved. 3. Elevated MCV: Resolved.  Previously, B-12 and folate were within normal limits.   4. Hypertension: Patient's blood pressure continues to be within normal limits.     Patient expressed understanding and was in agreement with this plan. He also understands that He can call clinic at any time with any questions, concerns, or complaints.    Lloyd Huger, MD 12/11/17 1:56 PM

## 2017-12-11 ENCOUNTER — Inpatient Hospital Stay: Payer: Medicare Other

## 2017-12-11 ENCOUNTER — Inpatient Hospital Stay (HOSPITAL_BASED_OUTPATIENT_CLINIC_OR_DEPARTMENT_OTHER): Payer: Medicare Other | Admitting: Oncology

## 2017-12-11 ENCOUNTER — Other Ambulatory Visit: Payer: Self-pay

## 2017-12-11 ENCOUNTER — Inpatient Hospital Stay: Payer: Medicare Other | Attending: Oncology

## 2017-12-11 VITALS — BP 112/70 | HR 71 | Temp 96.9°F | Resp 18 | Wt 177.0 lb

## 2017-12-11 DIAGNOSIS — I10 Essential (primary) hypertension: Secondary | ICD-10-CM | POA: Diagnosis not present

## 2017-12-11 DIAGNOSIS — D509 Iron deficiency anemia, unspecified: Secondary | ICD-10-CM | POA: Diagnosis not present

## 2017-12-11 DIAGNOSIS — Z85820 Personal history of malignant melanoma of skin: Secondary | ICD-10-CM

## 2017-12-11 DIAGNOSIS — Z803 Family history of malignant neoplasm of breast: Secondary | ICD-10-CM | POA: Diagnosis not present

## 2017-12-11 DIAGNOSIS — D500 Iron deficiency anemia secondary to blood loss (chronic): Secondary | ICD-10-CM

## 2017-12-11 DIAGNOSIS — Z8042 Family history of malignant neoplasm of prostate: Secondary | ICD-10-CM | POA: Diagnosis not present

## 2017-12-11 LAB — CBC WITH DIFFERENTIAL/PLATELET
Basophils Absolute: 0 10*3/uL (ref 0–0.1)
Basophils Relative: 0 %
EOS ABS: 0.2 10*3/uL (ref 0–0.7)
Eosinophils Relative: 3 %
HCT: 35.5 % — ABNORMAL LOW (ref 40.0–52.0)
HEMOGLOBIN: 11.9 g/dL — AB (ref 13.0–18.0)
LYMPHS ABS: 1.3 10*3/uL (ref 1.0–3.6)
LYMPHS PCT: 17 %
MCH: 33.5 pg (ref 26.0–34.0)
MCHC: 33.7 g/dL (ref 32.0–36.0)
MCV: 99.5 fL (ref 80.0–100.0)
MONOS PCT: 8 %
Monocytes Absolute: 0.6 10*3/uL (ref 0.2–1.0)
NEUTROS PCT: 72 %
Neutro Abs: 5.8 10*3/uL (ref 1.4–6.5)
Platelets: 162 10*3/uL (ref 150–440)
RBC: 3.57 MIL/uL — ABNORMAL LOW (ref 4.40–5.90)
RDW: 19.9 % — ABNORMAL HIGH (ref 11.5–14.5)
WBC: 7.9 10*3/uL (ref 3.8–10.6)

## 2017-12-11 LAB — SAMPLE TO BLOOD BANK

## 2017-12-11 LAB — FERRITIN: Ferritin: 33 ng/mL (ref 24–336)

## 2017-12-11 LAB — IRON AND TIBC
IRON: 69 ug/dL (ref 45–182)
SATURATION RATIOS: 22 % (ref 17.9–39.5)
TIBC: 317 ug/dL (ref 250–450)
UIBC: 248 ug/dL

## 2017-12-11 NOTE — Progress Notes (Signed)
Here for follow up. Per pt " energy still low " stated he sleeps a lot during day .

## 2017-12-18 ENCOUNTER — Ambulatory Visit: Payer: Medicare Other | Admitting: Oncology

## 2017-12-18 ENCOUNTER — Ambulatory Visit: Payer: Medicare Other

## 2017-12-18 ENCOUNTER — Other Ambulatory Visit: Payer: Medicare Other

## 2018-02-27 ENCOUNTER — Encounter: Payer: Self-pay | Admitting: Oncology

## 2018-03-13 ENCOUNTER — Other Ambulatory Visit: Payer: Medicare Other

## 2018-03-13 ENCOUNTER — Ambulatory Visit: Payer: Medicare Other | Admitting: Oncology

## 2018-03-13 ENCOUNTER — Ambulatory Visit: Payer: Medicare Other

## 2018-03-27 ENCOUNTER — Emergency Department: Payer: Medicare Other

## 2018-03-27 ENCOUNTER — Emergency Department
Admission: EM | Admit: 2018-03-27 | Discharge: 2018-03-27 | Disposition: A | Discharge status: Home / Self Care | Payer: Medicare Other | Attending: Emergency Medicine | Admitting: Emergency Medicine

## 2018-03-27 ENCOUNTER — Encounter: Payer: Self-pay | Admitting: Emergency Medicine

## 2018-03-27 ENCOUNTER — Other Ambulatory Visit: Payer: Self-pay

## 2018-03-27 DIAGNOSIS — E785 Hyperlipidemia, unspecified: Secondary | ICD-10-CM | POA: Insufficient documentation

## 2018-03-27 DIAGNOSIS — Z950 Presence of cardiac pacemaker: Secondary | ICD-10-CM | POA: Diagnosis not present

## 2018-03-27 DIAGNOSIS — Z86718 Personal history of other venous thrombosis and embolism: Secondary | ICD-10-CM | POA: Diagnosis not present

## 2018-03-27 DIAGNOSIS — Z7982 Long term (current) use of aspirin: Secondary | ICD-10-CM | POA: Insufficient documentation

## 2018-03-27 DIAGNOSIS — Z79899 Other long term (current) drug therapy: Secondary | ICD-10-CM | POA: Insufficient documentation

## 2018-03-27 DIAGNOSIS — I4891 Unspecified atrial fibrillation: Secondary | ICD-10-CM | POA: Diagnosis not present

## 2018-03-27 DIAGNOSIS — R0602 Shortness of breath: Secondary | ICD-10-CM | POA: Insufficient documentation

## 2018-03-27 DIAGNOSIS — N183 Chronic kidney disease, stage 3 (moderate): Secondary | ICD-10-CM | POA: Insufficient documentation

## 2018-03-27 DIAGNOSIS — I129 Hypertensive chronic kidney disease with stage 1 through stage 4 chronic kidney disease, or unspecified chronic kidney disease: Secondary | ICD-10-CM | POA: Diagnosis not present

## 2018-03-27 LAB — BASIC METABOLIC PANEL
Anion gap: 10 (ref 5–15)
BUN: 22 mg/dL (ref 8–23)
CO2: 23 mmol/L (ref 22–32)
Calcium: 8.9 mg/dL (ref 8.9–10.3)
Chloride: 107 mmol/L (ref 98–111)
Creatinine, Ser: 1.62 mg/dL — ABNORMAL HIGH (ref 0.61–1.24)
GFR calc Af Amer: 43 mL/min — ABNORMAL LOW (ref >60)
GFR calc non Af Amer: 37 mL/min — ABNORMAL LOW (ref >60)
GLUCOSE: 128 mg/dL — AB (ref 70–99)
POTASSIUM: 3.8 mmol/L (ref 3.5–5.1)
Sodium: 140 mmol/L (ref 135–145)

## 2018-03-27 LAB — CBC
HEMATOCRIT: 34.4 % — AB (ref 39.0–52.0)
HEMOGLOBIN: 10.9 g/dL — AB (ref 13.0–17.0)
MCH: 32.9 pg (ref 26.0–34.0)
MCHC: 31.7 g/dL (ref 30.0–36.0)
MCV: 103.9 fL — AB (ref 80.0–100.0)
Platelets: 110 10*3/uL — ABNORMAL LOW (ref 150–400)
RBC: 3.31 MIL/uL — ABNORMAL LOW (ref 4.22–5.81)
RDW: 16.5 % — ABNORMAL HIGH (ref 11.5–15.5)
WBC: 10.7 10*3/uL — ABNORMAL HIGH (ref 4.0–10.5)
nRBC: 0 % (ref 0.0–0.2)

## 2018-03-27 LAB — TROPONIN I
Troponin I: 0.06 ng/mL (ref <0.03)
Troponin I: 0.06 ng/mL (ref <0.03)

## 2018-03-27 MED ORDER — IOHEXOL 350 MG/ML SOLN
60.0000 mL | Freq: Once | INTRAVENOUS | Status: AC | PRN
  Administered 2018-03-27: 60 mL via INTRAVENOUS

## 2018-03-27 MED ORDER — FUROSEMIDE 10 MG/ML IJ SOLN: 40.0000 mg | Freq: Once | INTRAMUSCULAR | Status: DC

## 2018-03-27 MED ORDER — AMOXICILLIN-POT CLAVULANATE 875-125 MG PO TABS: 1.0000 | ORAL_TABLET | Freq: Two times a day (BID) | ORAL | 0 refills | Status: AC

## 2018-03-27 MED ORDER — AMOXICILLIN-POT CLAVULANATE 875-125 MG PO TABS
1.0000 | ORAL_TABLET | Freq: Once | ORAL | Status: AC
  Administered 2018-03-27: 1 via ORAL
  Filled 2018-03-27: qty 1

## 2018-03-27 MED ORDER — FUROSEMIDE 10 MG/ML IJ SOLN
20.0000 mg | Freq: Once | INTRAMUSCULAR | Status: AC
  Administered 2018-03-27: 20 mg via INTRAVENOUS
  Filled 2018-03-27: qty 4

## 2018-03-27 NOTE — Discharge Instructions (Signed)
Your x-ray is showing some findings of possible inflammation in the right lower lung.  It is possible that this is due to aspiration from your vomiting the other day although it may also be fluid buildup from other causes.  Take the antibiotic as prescribed and finish the full course.  Return to the ER for new, worsening, persistent shortness of breath, new or worsening chest or shoulder pain, fevers, vomiting, weakness, or any other new or worsening symptoms that concern you.

## 2018-03-27 NOTE — ED Triage Notes (Signed)
PT to ED with family with c/o low o2 sats at home in the low 80s. PT hx of PE and DVT per family. PT c/o SOB with exertion. Pt able to speak in full sentences, NAD noted. PT c/o LFT shoulder pain since yesterday with mvoement.

## 2018-03-27 NOTE — ED Provider Notes (Signed)
[facility] Emergency Department Provider Note ____________________________________________   First MD Initiated Contact with Patient 03/27/18 1507     (approximate)  I have reviewed the triage vital signs and the nursing notes.   HISTORY  Chief Complaint Shortness of Breath    HPI Howard Harmon is a 84 y.o. male with PMH as noted below including DVT and PE (not on anticoagulation due to GI bleed), hypertension, and chronic kidney disease who presents with multiple symptoms over the last several days, but primarily shortness of breath with exertion, associated with hypoxia.  Per the daughter, they have a pulse oximeter at home which normally reads in the 90s but it was as low as the high 70s in the last day.  The patient also had some wheezing and crackles in his lungs.  The patient reports some generalized weakness, and had some nausea with an episode of vomiting 3 days ago which has now resolved.  He also reports left shoulder and chest pain over the last day.   Past Medical History:  Diagnosis Date  . Arthritis   . Atrial fibrillation ([place])   . Blood clotting tendency ([place])   . Chronic kidney disease   . DVT (deep venous thrombosis) ([place])   . Hypertension   . Melanoma ([place])   . Pacemaker     Patient Active Problem List   Diagnosis Date Noted  . Anemia   . Grade II hemorrhoids   . Acute posthemorrhagic anemia   . Hypoxia 10/23/2017  . Hematochezia   . Benign neoplasm of ascending colon   . Atrial fibrillation ([place]) 08/31/2016  . Sinoatrial node dysfunction ([place]) 08/31/2016  . History of DVT of lower extremity 06/14/2016  . Aortic atherosclerosis ([place]) 11/10/2015  . Chronic respiratory failure with hypoxia ([place]) 11/09/2015  . Lumbar stenosis with neurogenic claudication 04/01/2015  . DDD (degenerative disc disease), lumbosacral 01/26/2015  . Rectal bleed 01/01/2015  . GIB (gastrointestinal bleeding) 10/12/2014  . Iron deficiency anemia  secondary to blood loss (chronic) 10/12/2014  . CKD (chronic kidney disease), stage III ([place]) 10/12/2014  . 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  . HTN (hypertension) 11/07/2013  . OA (osteoarthritis) of knee 11/07/2013  . Peripheral sensory neuropathy 11/07/2013  . Malignant melanoma of skin of chest ([place]) 08/06/2013    Past Surgical History:  Procedure Laterality Date  . CHOLECYSTECTOMY    . COLONOSCOPY WITH PROPOFOL N/A 10/15/2014   Procedure: COLONOSCOPY WITH PROPOFOL;  Surgeon: Josefine Class, MD;  Location: [facility] ENDOSCOPY;  Service: Endoscopy;  Laterality: N/A;  . COLONOSCOPY WITH PROPOFOL N/A 10/23/2017   Procedure: COLONOSCOPY WITH PROPOFOL;  Surgeon: Lucilla Lame, MD;  Location: [facility] ENDOSCOPY;  Service: Endoscopy;  Laterality: N/A;  . ESOPHAGOGASTRODUODENOSCOPY N/A 10/24/2017   Procedure: ESOPHAGOGASTRODUODENOSCOPY (EGD);  Surgeon: Virgel Manifold, MD;  Location: [facility] ENDOSCOPY;  Service: Endoscopy;  Laterality: N/A;  . INSERT / REPLACE / REMOVE PACEMAKER      Prior to Admission medications   Medication Sig Start Date End Date Taking? Authorizing Provider  aspirin 81 MG chewable tablet Chew 81 mg by mouth daily.   Yes [provider]  Cholecalciferol (VITAMIN D3 PO) Take 1 tablet by mouth daily.   Yes [provider]  docusate sodium (COLACE) 100 MG capsule Take 100-200 mg by mouth daily.   Yes [provider]  furosemide (LASIX) 20 MG tablet Take 20 mg by mouth daily as needed.  04/25/17  Yes [provider]  LACTASE ENZYME PO Take 1 tablet  by mouth as needed.   Yes [provider]  MISC NATURAL PRODUCTS PO Take 1 tablet by mouth daily.   Yes [provider]  OMEGA-3 FATTY ACIDS PO Take 1-2 capsules by mouth 2 (two) times daily.    Yes [provider]  RABEprazole (ACIPHEX) 20 MG tablet Take 20 mg by mouth daily.    Yes [provider]  SENNA LEAVES PO Take 470 mg by mouth daily.   Yes [provider]  tetrahydrozoline-zinc (VISINE-AC) 0.05-0.25 % ophthalmic solution Place 1 drop into both eyes 3 (three) times daily as needed.   Yes [provider]  traMADol (ULTRAM) 50 MG tablet Take 100 mg by mouth 2 (two) times daily.    Yes [provider]  vitamin B-12 (CYANOCOBALAMIN) 1000 MCG tablet Take 1,000 mcg by mouth daily.   Yes [provider]  amoxicillin-clavulanate (AUGMENTIN) 875-125 MG tablet Take 1 tablet by mouth 2 (two) times daily for 7 days. 03/28/18 04/04/18  Arta Silence, MD    Allergies Warfarin  Family History  Problem Relation Age of Onset  . CAD Father   . Prostate cancer Brother   . Breast cancer Sister     Social History Social History   Tobacco Use  . Smoking status: Never Smoker  . Smokeless tobacco: Never Used  Substance Use Topics  . Alcohol use: No  . Drug use: Never    Review of Systems  Constitutional: No fever.  Observe for weakness. Eyes: No redness. ENT: No sore throat. Cardiovascular: Positive for chest pain. Respiratory: Positive for shortness of breath. Gastrointestinal: Positive for resolved vomiting.  Genitourinary: Negative for dysuria.  Musculoskeletal: Negative for back pain. Skin: Negative for rash. Neurological: Negative for headache.   ____________________________________________   PHYSICAL EXAM:  VITAL SIGNS: ED Triage Vitals  Enc Vitals Group     BP 03/27/18 1406 (!) 162/95     Pulse Rate 03/27/18 1406 65     Resp 03/27/18 1406 18     Temp 03/27/18 1406 (!) 97.5 F (36.4 C)     Temp Source 03/27/18 1406 Oral     SpO2 03/27/18 1406 100 %     Weight 03/27/18 1407 176 lb 5.9 oz (80 kg)     Height 03/27/18 1407 6' (1.829 m)     Head Circumference --      Peak Flow --      Pain Score 03/27/18 1407 8     Pain Loc --      Pain Edu? --      Excl. in [place]? --     Constitutional: Alert and oriented.  Weak appearing but in no acute distress. Eyes: Conjunctivae are normal.    Head: Atraumatic. Nose: No congestion/rhinnorhea. Mouth/Throat: Mucous membranes are moist.   Neck: Normal range of motion.  Cardiovascular: Normal rate, regular rhythm. Grossly normal heart sounds.  Good peripheral circulation. Respiratory: Normal respiratory effort.  No retractions.  Faint rales to bilateral lung bases.  No wheezing. Gastrointestinal:  No distention.  Musculoskeletal: Trace bilateral lower extremity edema.  Extremities warm and well perfused.  No calf or popliteal swelling or tenderness. Neurologic:  Normal speech and language. No gross focal neurologic deficits are appreciated.  Skin:  Skin is warm and dry. No rash noted. Psychiatric: Mood and affect are normal. Speech and behavior are normal.  ____________________________________________   LABS (all labs ordered are listed, but only abnormal results are displayed)  Labs Reviewed  BASIC METABOLIC PANEL - Abnormal; Notable  for the following components:      Result Value   Glucose, Bld 128 (*)    Creatinine, Ser 1.62 (*)    GFR calc non Af Amer 37 (*)    GFR calc Af Amer 43 (*)    All other components within normal limits  CBC - Abnormal; Notable for the following components:   WBC 10.7 (*)    RBC 3.31 (*)    Hemoglobin 10.9 (*)    HCT 34.4 (*)    MCV 103.9 (*)    RDW 16.5 (*)    Platelets 110 (*)    All other components within normal limits  TROPONIN I - Abnormal; Notable for the following components:   Troponin I 0.06 (*)    All other components within normal limits  TROPONIN I - Abnormal; Notable for the following components:   Troponin I 0.06 (*)    All other components within normal limits   ____________________________________________  EKG  ED ECG REPORT I, Arta Silence, the attending physician, personally viewed and interpreted this ECG.  Date: 03/27/2018 EKG Time: 1413 Rate: 67 Rhythm: Ventricular paced rhythm QRS Axis: normal Intervals: n/a ST/T Wave abnormalities:  normal Narrative Interpretation: no evidence of acute ischemia  ____________________________________________  RADIOLOGY  CXR: Bibasilar opacities consistent with mild edema, no other acute findings CT chest: No PE.  Right basilar atelectasis versus infiltrate with small pleural effusion. ____________________________________________   PROCEDURES  Procedure(s) performed: No  Procedures  Critical Care performed: No ____________________________________________   INITIAL IMPRESSION / ASSESSMENT AND PLAN / ED COURSE  Pertinent labs & imaging results that were available during my care of the patient were reviewed by me and considered in my medical decision making (see chart for details).  84 year old male with PMH as noted above presents with shortness of breath, generalized weakness, and chest and left shoulder pain over the last several days.  He was also noted to be hypoxic on a home pulse oximeter.  On exam today, he is hypertensive but his other vital signs are normal.  His O2 saturation is in the 90s on room air currently.  The remainder of the exam is as described above with some bibasilar rales.  Overall I suspect most likely mild pulmonary edema/fluid overload.  The clinical and chest x-ray findings are less consistent with pneumonia or other infectious process.  However given the patient's comorbidities the differential also includes PE, as well as ACS.  We will obtain basic and cardiac labs, CT angiogram, and reassess.  ----------------------------------------- 6:32 PM on 03/27/2018 -----------------------------------------  The patient's lab work-up was unremarkable and consistent with his baseline.  The patient's troponin is minimally elevated which is consistent with prior.  Delta troponin after 3 hours was unchanged.  The patient has no EKG changes or evidence to suggest ACS.  CT chest showed no evidence of PE, and basilar opacity with small effusion localized to the  right side rather than diffuse pulmonary edema.  I gave a small dose of IV Lasix in case there was a component of edema.  Based on further discussion with the patient and his daughter, they have some concern for possible aspiration after his vomiting event the other day.  Although his white count is not elevated and he has no fever, he has been coughing somewhat.  Since the findings are more localized to the right base, I think a mild aspiration pneumonitis or pneumonia is possible.  The patient would strongly prefer to go home.  He has no  hypoxia, other vital sign abnormalities, respiratory distress, or other indications for admission.  I think that it would be appropriate to place him on an oral course of antibiotics for this possible aspiration.  The patient and his daughter agree with this plan.  I counseled them both extensively on the results of the work-up.  Return precautions given, and they expressed understanding. ____________________________________________   FINAL CLINICAL IMPRESSION(S) / ED DIAGNOSES  Final diagnoses:  Shortness of breath      NEW MEDICATIONS STARTED DURING THIS VISIT:  New Prescriptions   AMOXICILLIN-CLAVULANATE (AUGMENTIN) 875-125 MG TABLET    Take 1 tablet by mouth 2 (two) times daily for 7 days.     Note:  This document was prepared using Dragon voice recognition software and may include unintentional dictation errors.    Arta Silence, MD 03/27/18 [phone]

## 2018-03-27 NOTE — ED Notes (Signed)
Patient transported to CT 

## 2018-10-23 ENCOUNTER — Encounter: Payer: Self-pay | Admitting: Emergency Medicine

## 2018-10-23 ENCOUNTER — Other Ambulatory Visit: Payer: Self-pay

## 2018-10-23 ENCOUNTER — Inpatient Hospital Stay
Admission: EM | Admit: 2018-10-23 | Discharge: 2018-10-26 | DRG: 871 | Disposition: A | Discharge status: Home / Self Care | Payer: Medicare Other | Attending: Internal Medicine | Admitting: Internal Medicine

## 2018-10-23 ENCOUNTER — Emergency Department: Payer: Medicare Other

## 2018-10-23 DIAGNOSIS — J189 Pneumonia, unspecified organism: Secondary | ICD-10-CM

## 2018-10-23 DIAGNOSIS — N179 Acute kidney failure, unspecified: Secondary | ICD-10-CM | POA: Diagnosis present

## 2018-10-23 DIAGNOSIS — I129 Hypertensive chronic kidney disease with stage 1 through stage 4 chronic kidney disease, or unspecified chronic kidney disease: Secondary | ICD-10-CM | POA: Diagnosis present

## 2018-10-23 DIAGNOSIS — R509 Fever, unspecified: Secondary | ICD-10-CM

## 2018-10-23 DIAGNOSIS — N390 Urinary tract infection, site not specified: Secondary | ICD-10-CM | POA: Diagnosis present

## 2018-10-23 DIAGNOSIS — A419 Sepsis, unspecified organism: Secondary | ICD-10-CM | POA: Diagnosis not present

## 2018-10-23 DIAGNOSIS — Z8249 Family history of ischemic heart disease and other diseases of the circulatory system: Secondary | ICD-10-CM

## 2018-10-23 DIAGNOSIS — J9601 Acute respiratory failure with hypoxia: Secondary | ICD-10-CM

## 2018-10-23 DIAGNOSIS — J690 Pneumonitis due to inhalation of food and vomit: Secondary | ICD-10-CM

## 2018-10-23 DIAGNOSIS — I482 Chronic atrial fibrillation, unspecified: Secondary | ICD-10-CM | POA: Diagnosis present

## 2018-10-23 DIAGNOSIS — Z8042 Family history of malignant neoplasm of prostate: Secondary | ICD-10-CM

## 2018-10-23 DIAGNOSIS — Z20828 Contact with and (suspected) exposure to other viral communicable diseases: Secondary | ICD-10-CM | POA: Diagnosis present

## 2018-10-23 DIAGNOSIS — Z803 Family history of malignant neoplasm of breast: Secondary | ICD-10-CM

## 2018-10-23 DIAGNOSIS — E872 Acidosis: Secondary | ICD-10-CM | POA: Diagnosis present

## 2018-10-23 DIAGNOSIS — Z86718 Personal history of other venous thrombosis and embolism: Secondary | ICD-10-CM

## 2018-10-23 DIAGNOSIS — N183 Chronic kidney disease, stage 3 (moderate): Secondary | ICD-10-CM | POA: Diagnosis present

## 2018-10-23 DIAGNOSIS — R0902 Hypoxemia: Secondary | ICD-10-CM | POA: Diagnosis present

## 2018-10-23 DIAGNOSIS — C4359 Malignant melanoma of other part of trunk: Secondary | ICD-10-CM | POA: Diagnosis present

## 2018-10-23 MED ORDER — SODIUM CHLORIDE 0.9 % IV BOLUS (SEPSIS)
500.0000 mL | Freq: Once | INTRAVENOUS | Status: AC
  Administered 2018-10-24: 500 mL via INTRAVENOUS

## 2018-10-23 MED ORDER — SODIUM CHLORIDE 0.9 % IV SOLN
500.0000 mg | INTRAVENOUS | Status: DC
  Administered 2018-10-24: 500 mg via INTRAVENOUS
  Filled 2018-10-23: qty 500

## 2018-10-23 MED ORDER — SODIUM CHLORIDE 0.9 % IV SOLN
2.0000 g | INTRAVENOUS | Status: DC
  Administered 2018-10-24: 2 g via INTRAVENOUS
  Filled 2018-10-23: qty 20

## 2018-10-23 MED ORDER — SODIUM CHLORIDE 0.9 % IV BOLUS (SEPSIS)
1000.0000 mL | Freq: Once | INTRAVENOUS | Status: AC
  Administered 2018-10-24: 1000 mL via INTRAVENOUS

## 2018-10-23 NOTE — ED Triage Notes (Signed)
Pt presents to ED from home with c/o vomiting X3, chills, and fever this evening. Pt fever was +101 at home and O2 level was said to be low per daughter. Pt alert and calm during triage. Able to answer questions at this time. Denies sob. No increased work of breathing noted.

## 2018-10-23 NOTE — ED Provider Notes (Signed)
[facility] Emergency Department Provider Note   ____________________________________________   First MD Initiated Contact with Patient 10/23/18 2341     (approximate)  I have reviewed the triage vital signs and the nursing notes.   HISTORY  Chief Complaint Fever and Emesis    HPI Howard Harmon is a 85 y.o. male who presents to the ED from home with a chief complaint of fever, chills, hypoxia and vomiting.  Daughter stated that patient had fever of 101 F at home; no antipyretic given.  She checked his oxygenation level and said it was "low".  Patient states he ate dinner then vomited 3 times and now feels much better after vomiting.  Patient denies chest pain, shortness of breath, abdominal pain diarrhea.  Denies recent travel, trauma or exposure to persons diagnosed with coronavirus.       Past Medical History:  Diagnosis Date   Arthritis    Atrial fibrillation ([place])    Blood clotting tendency (HCC)    Chronic kidney disease    DVT (deep venous thrombosis) ([place])    Hypertension    Melanoma ([place])    Pacemaker     Patient Active Problem List   Diagnosis Date Noted   Sepsis ([place]) 10/24/2018   Anemia    Grade II hemorrhoids    Acute posthemorrhagic anemia    Hypoxia 10/23/2017   Hematochezia    Benign neoplasm of ascending colon    Atrial fibrillation ([place]) 08/31/2016   Sinoatrial node dysfunction ([place]) 08/31/2016   History of DVT of lower extremity 06/14/2016   Aortic atherosclerosis ([place]) 11/10/2015   Chronic respiratory failure with hypoxia ([place]) 11/09/2015   Lumbar stenosis with neurogenic claudication 04/01/2015   DDD (degenerative disc disease), lumbosacral 01/26/2015   Rectal bleed 01/01/2015   GIB (gastrointestinal bleeding) 10/12/2014   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   CKD (chronic kidney disease), stage III ([place]) 10/12/2014   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     HTN (hypertension) 11/07/2013   OA (osteoarthritis) of knee 11/07/2013   Peripheral sensory neuropathy 11/07/2013   Malignant melanoma of skin of chest ([place]) 08/06/2013    Past Surgical History:  Procedure Laterality Date   CHOLECYSTECTOMY     COLONOSCOPY WITH PROPOFOL N/A 10/15/2014   Procedure: COLONOSCOPY WITH PROPOFOL;  Surgeon: Josefine Class, MD;  Location: [facility] ENDOSCOPY;  Service: Endoscopy;  Laterality: N/A;   COLONOSCOPY WITH PROPOFOL N/A 10/23/2017   Procedure: COLONOSCOPY WITH PROPOFOL;  Surgeon: Lucilla Lame, MD;  Location: [facility] ENDOSCOPY;  Service: Endoscopy;  Laterality: N/A;   ESOPHAGOGASTRODUODENOSCOPY N/A 10/24/2017   Procedure: ESOPHAGOGASTRODUODENOSCOPY (EGD);  Surgeon: Virgel Manifold, MD;  Location: [facility] ENDOSCOPY;  Service: Endoscopy;  Laterality: N/A;   INSERT / REPLACE / REMOVE PACEMAKER      Prior to Admission medications   Medication Sig Start Date End Date Taking? Authorizing Provider  aspirin 81 MG chewable tablet Chew 81 mg by mouth daily.   Yes [provider]  Cholecalciferol (VITAMIN D3 PO) Take 1 tablet by mouth daily.   Yes [provider]  furosemide (LASIX) 20 MG tablet Take 20 mg by mouth daily as needed.  04/25/17  Yes [provider]  RABEprazole (ACIPHEX) 20 MG tablet Take 20 mg by mouth daily.    Yes [provider]  solifenacin (VESICARE) 5 MG tablet Take 1 tablet by mouth daily. 07/04/18  Yes [provider]  tetrahydrozoline-zinc (VISINE-AC) 0.05-0.25 % ophthalmic solution Place 1 drop into both eyes  3 (three) times daily as needed.   Yes [provider]  traMADol (ULTRAM) 50 MG tablet Take 100 mg by mouth 2 (two) times daily.    Yes [provider]  vitamin B-12 (CYANOCOBALAMIN) 1000 MCG tablet Take 1,000 mcg by mouth daily.   Yes [provider]  docusate sodium (COLACE) 100 MG capsule Take 100-200 mg by mouth daily.    [provider]  LACTASE  ENZYME PO Take 1 tablet by mouth as needed.    [provider]  MISC NATURAL PRODUCTS PO Take 1 tablet by mouth daily.    [provider]  OMEGA-3 FATTY ACIDS PO Take 1-2 capsules by mouth 2 (two) times daily.     [provider]  SENNA LEAVES PO Take 470 mg by mouth daily.    [provider]    Allergies Warfarin  Family History  Problem Relation Age of Onset   CAD Father    Prostate cancer Brother    Breast cancer Sister     Social History Social History   Tobacco Use   Smoking status: Never Smoker   Smokeless tobacco: Never Used  Substance Use Topics   Alcohol use: No   Drug use: Never    Review of Systems  Constitutional: Positive for fever/chills Eyes: No visual changes. ENT: No sore throat. Cardiovascular: Denies chest pain. Respiratory: Denies shortness of breath. Gastrointestinal: No abdominal pain.  Positive for vomiting.  No diarrhea.  No constipation. Genitourinary: Negative for dysuria. Musculoskeletal: Negative for back pain. Skin: Negative for rash. Neurological: Negative for headaches, focal weakness or numbness.   ____________________________________________   PHYSICAL EXAM:  VITAL SIGNS: ED Triage Vitals  Enc Vitals Group     BP 10/23/18 2334 114/65     Pulse Rate 10/23/18 2334 75     Resp 10/23/18 2334 20     Temp 10/23/18 2334 98.8 F (37.1 C)     Temp Source 10/23/18 2334 Oral     SpO2 10/23/18 2334 (!) 84 %     Weight 10/23/18 2328 180 lb (81.6 kg)     Height 10/23/18 2328 5\' 11"  (1.803 m)     Head Circumference --      Peak Flow --      Pain Score 10/23/18 2327 5     Pain Loc --      Pain Edu? --      Excl. in [place]? --     Constitutional: Alert and oriented. Well appearing and in mild acute distress. Eyes: Conjunctivae are normal. PERRL. EOMI. Head: Atraumatic. Nose: No congestion/rhinnorhea. Mouth/Throat: Mucous membranes are moist.  Oropharynx non-erythematous. Neck: No stridor.     Cardiovascular: Normal rate, regular rhythm. Grossly normal heart sounds.  Good peripheral circulation. Respiratory: Increased respiratory effort.  No retractions. Lungs with scattered rhonchi. Gastrointestinal: Soft and nontender to light or deep palpation. No distention. No abdominal bruits. No CVA tenderness. Musculoskeletal: No lower extremity tenderness nor edema.  No joint effusions. Neurologic:  Normal speech and language. No gross focal neurologic deficits are appreciated.  Skin:  Skin is warm, dry and intact. No rash noted.  No petechiae. Psychiatric: Mood and affect are normal. Speech and behavior are normal.  ____________________________________________   LABS (all labs ordered are listed, but only abnormal results are displayed)  Labs Reviewed  LACTIC ACID, PLASMA - Abnormal; Notable for the following components:      Result Value   Lactic Acid, Venous 3.4 (*)    All other components  within normal limits  LACTIC ACID, PLASMA - Abnormal; Notable for the following components:   Lactic Acid, Venous 2.1 (*)    All other components within normal limits  COMPREHENSIVE METABOLIC PANEL - Abnormal; Notable for the following components:   Glucose, Bld 100 (*)    BUN 32 (*)    Creatinine, Ser 1.70 (*)    GFR calc non Af Amer 36 (*)    GFR calc Af Amer 41 (*)    All other components within normal limits  BRAIN NATRIURETIC PEPTIDE - Abnormal; Notable for the following components:   B Natriuretic Peptide 168.0 (*)    All other components within normal limits  CBC WITH DIFFERENTIAL/PLATELET - Abnormal; Notable for the following components:   WBC 14.8 (*)    RBC 3.55 (*)    Hemoglobin 11.7 (*)    HCT 35.8 (*)    MCV 100.8 (*)    Platelets 125 (*)    Neutro Abs 13.6 (*)    Lymphs Abs 0.4 (*)    Abs Immature Granulocytes 0.08 (*)    All other components within normal limits  URINALYSIS, COMPLETE (UACMP) WITH MICROSCOPIC - Abnormal; Notable for the following components:   Color,  Urine YELLOW (*)    APPearance HAZY (*)    Hgb urine dipstick MODERATE (*)    Leukocytes,Ua SMALL (*)    RBC / HPF >50 (*)    All other components within normal limits  CBC - Abnormal; Notable for the following components:   WBC 19.1 (*)    RBC 3.09 (*)    Hemoglobin 10.2 (*)    HCT 31.5 (*)    MCV 101.9 (*)    Platelets 122 (*)    All other components within normal limits  CREATININE, SERUM - Abnormal; Notable for the following components:   Creatinine, Ser 1.65 (*)    GFR calc non Af Amer 37 (*)    GFR calc Af Amer 43 (*)    All other components within normal limits  SARS CORONAVIRUS 2 (HOSPITAL ORDER, [place] [facility])  CULTURE, BLOOD (ROUTINE X 2)  CULTURE, BLOOD (ROUTINE X 2)  URINE CULTURE  URINE CULTURE  EXPECTORATED SPUTUM ASSESSMENT W REFEX TO RESP CULTURE  LIPASE, BLOOD  PROCALCITONIN  PROTIME-INR  PROCALCITONIN  CORTISOL-AM, BLOOD   ____________________________________________  EKG  ED ECG REPORT I, Jene Huq J, the attending physician, personally viewed and interpreted this ECG.   Date: 10/24/2018  EKG Time: 2345  Rate: 79  Rhythm: Paced  Axis: Paced  Intervals:Paced  ST&T Change: Paced  ____________________________________________  RADIOLOGY  ED MD interpretation: No acute cardiopulmonary process  Official radiology report(s): Dg Chest Port 1 View  Result Date: 10/24/2018 CLINICAL DATA:  85 year old male with fever and vomiting. EXAM: PORTABLE CHEST 1 VIEW COMPARISON:  CTA chest 03/27/2018 and earlier. FINDINGS: Portable AP upright view at 0001 hours. Stable lung volumes and mediastinal contours. Mild cardiomegaly. Visualized tracheal air column is within normal limits. Stable left chest cardiac pacemaker. Allowing for portable technique the lungs are clear. No pneumothorax. Advanced degenerative changes at both shoulders. Negative visible bowel gas pattern. IMPRESSION: No acute cardiopulmonary abnormality. Electronically Signed    By: Genevie Ann M.D.   On: 10/24/2018 00:24    ____________________________________________   PROCEDURES  Procedure(s) performed (including Critical Care):  Procedures  CRITICAL CARE Performed by: Paulette Blanch   Total critical care time: 45 minutes  Critical care time was exclusive of separately billable procedures and treating other  patients.  Critical care was necessary to treat or prevent imminent or life-threatening deterioration.  Critical care was time spent personally by me on the following activities: development of treatment plan with patient and/or surrogate as well as nursing, discussions with consultants, evaluation of patient's response to treatment, examination of patient, obtaining history from patient or surrogate, ordering and performing treatments and interventions, ordering and review of laboratory studies, ordering and review of radiographic studies, pulse oximetry and re-evaluation of patient's condition. ____________________________________________   INITIAL IMPRESSION / ASSESSMENT AND PLAN / ED COURSE  As part of my medical decision making, I reviewed the following data within the [place] notes reviewed and incorporated, Labs reviewed, EKG interpreted, Old chart reviewed, Radiograph reviewed, Discussed with admitting physician Dr. Sidney Ace and Notes from prior ED visits     Miken Stecher was evaluated in Emergency Department on 10/24/2018 for the symptoms described in the history of present illness. He was evaluated in the context of the global COVID-19 pandemic, which necessitated consideration that the patient might be at risk for infection with the SARS-CoV-2 virus that causes COVID-19. Institutional protocols and algorithms that pertain to the evaluation of patients at risk for COVID-19 are in a state of rapid change based on information released by regulatory bodies including the CDC and federal and state organizations. These  policies and algorithms were followed during the patient's care in the ED.   85 year old male who presents with fever, chills, vomiting, rhonchi heard on exam. Differential includes, but is not limited to, viral syndrome, bronchitis including COPD exacerbation, pneumonia, reactive airway disease including asthma, CHF including exacerbation with or without pulmonary/interstitial edema, pneumothorax, ACS, thoracic trauma, and pulmonary embolism.  Patient feels hot to the touch; will obtain core temperature.  Initiate ED sepsis protocol.  Suspect pneumonia based on rhonchi heard on exam.  Will start IV antibiotics for community-acquired pneumonia.   Clinical Course as of Oct 23 536  Thu Oct 24, 2018  0047 Patient was given Tylenol for temperature 101.9 F.  Lactic acid is 3.4.  Although initial chest x-ray is unremarkable, based on history and clinical exam, I will add IV clindamycin for suspected aspiration.  Anticipate hospitalization awaiting results of COVID test.   [JS]  0118 Patient is covered negative.  Discussed with hospitalist Dr. Sidney Ace to evaluate patient in the emergency department for admission.   [JS]    Clinical Course User Index [JS] Paulette Blanch, MD     ____________________________________________   FINAL CLINICAL IMPRESSION(S) / ED DIAGNOSES  Final diagnoses:  Fever, unspecified fever cause  Aspiration pneumonia, unspecified aspiration pneumonia type, unspecified laterality, unspecified part of lung ([place])  Community acquired pneumonia, unspecified laterality  Sepsis, due to unspecified organism, unspecified whether acute organ dysfunction present [facility])  Acute respiratory failure with hypoxia [facility])     ED Discharge Orders    None       Note:  This document was prepared using Dragon voice recognition software and may include unintentional dictation errors.   Paulette Blanch, MD 10/24/18 [phone]

## 2018-10-24 ENCOUNTER — Inpatient Hospital Stay: Payer: Medicare Other

## 2018-10-24 ENCOUNTER — Emergency Department: Payer: Medicare Other

## 2018-10-24 ENCOUNTER — Encounter: Payer: Self-pay | Admitting: *Deleted

## 2018-10-24 DIAGNOSIS — Z20828 Contact with and (suspected) exposure to other viral communicable diseases: Secondary | ICD-10-CM | POA: Diagnosis present

## 2018-10-24 DIAGNOSIS — R509 Fever, unspecified: Secondary | ICD-10-CM | POA: Diagnosis present

## 2018-10-24 DIAGNOSIS — C4359 Malignant melanoma of other part of trunk: Secondary | ICD-10-CM | POA: Diagnosis present

## 2018-10-24 DIAGNOSIS — R0902 Hypoxemia: Secondary | ICD-10-CM | POA: Diagnosis present

## 2018-10-24 DIAGNOSIS — I129 Hypertensive chronic kidney disease with stage 1 through stage 4 chronic kidney disease, or unspecified chronic kidney disease: Secondary | ICD-10-CM | POA: Diagnosis present

## 2018-10-24 DIAGNOSIS — I482 Chronic atrial fibrillation, unspecified: Secondary | ICD-10-CM | POA: Diagnosis present

## 2018-10-24 DIAGNOSIS — Z803 Family history of malignant neoplasm of breast: Secondary | ICD-10-CM | POA: Diagnosis not present

## 2018-10-24 DIAGNOSIS — A419 Sepsis, unspecified organism: Secondary | ICD-10-CM | POA: Diagnosis present

## 2018-10-24 DIAGNOSIS — Z8249 Family history of ischemic heart disease and other diseases of the circulatory system: Secondary | ICD-10-CM | POA: Diagnosis not present

## 2018-10-24 DIAGNOSIS — Z86718 Personal history of other venous thrombosis and embolism: Secondary | ICD-10-CM | POA: Diagnosis not present

## 2018-10-24 DIAGNOSIS — N179 Acute kidney failure, unspecified: Secondary | ICD-10-CM | POA: Diagnosis present

## 2018-10-24 DIAGNOSIS — N390 Urinary tract infection, site not specified: Secondary | ICD-10-CM | POA: Diagnosis present

## 2018-10-24 DIAGNOSIS — J690 Pneumonitis due to inhalation of food and vomit: Secondary | ICD-10-CM | POA: Diagnosis present

## 2018-10-24 DIAGNOSIS — E872 Acidosis: Secondary | ICD-10-CM | POA: Diagnosis present

## 2018-10-24 DIAGNOSIS — Z8042 Family history of malignant neoplasm of prostate: Secondary | ICD-10-CM | POA: Diagnosis not present

## 2018-10-24 DIAGNOSIS — N183 Chronic kidney disease, stage 3 (moderate): Secondary | ICD-10-CM | POA: Diagnosis present

## 2018-10-24 LAB — CBC WITH DIFFERENTIAL/PLATELET
Abs Immature Granulocytes: 0.08 10*3/uL — ABNORMAL HIGH (ref 0.00–0.07)
Basophils Absolute: 0 10*3/uL (ref 0.0–0.1)
Basophils Relative: 0 %
Eosinophils Absolute: 0 10*3/uL (ref 0.0–0.5)
Eosinophils Relative: 0 %
HCT: 35.8 % — ABNORMAL LOW (ref 39.0–52.0)
Hemoglobin: 11.7 g/dL — ABNORMAL LOW (ref 13.0–17.0)
Immature Granulocytes: 1 %
Lymphocytes Relative: 3 %
Lymphs Abs: 0.4 10*3/uL — ABNORMAL LOW (ref 0.7–4.0)
MCH: 33 pg (ref 26.0–34.0)
MCHC: 32.7 g/dL (ref 30.0–36.0)
MCV: 100.8 fL — ABNORMAL HIGH (ref 80.0–100.0)
Monocytes Absolute: 0.7 10*3/uL (ref 0.1–1.0)
Monocytes Relative: 5 %
Neutro Abs: 13.6 10*3/uL — ABNORMAL HIGH (ref 1.7–7.7)
Neutrophils Relative %: 91 %
Platelets: 125 10*3/uL — ABNORMAL LOW (ref 150–400)
RBC: 3.55 MIL/uL — ABNORMAL LOW (ref 4.22–5.81)
RDW: 14.3 % (ref 11.5–15.5)
WBC: 14.8 10*3/uL — ABNORMAL HIGH (ref 4.0–10.5)
nRBC: 0 % (ref 0.0–0.2)

## 2018-10-24 LAB — COMPREHENSIVE METABOLIC PANEL
ALT: 15 U/L (ref 0–44)
AST: 22 U/L (ref 15–41)
Albumin: 4 g/dL (ref 3.5–5.0)
Alkaline Phosphatase: 61 U/L (ref 38–126)
Anion gap: 10 (ref 5–15)
BUN: 32 mg/dL — ABNORMAL HIGH (ref 8–23)
CO2: 23 mmol/L (ref 22–32)
Calcium: 9 mg/dL (ref 8.9–10.3)
Chloride: 107 mmol/L (ref 98–111)
Creatinine, Ser: 1.7 mg/dL — ABNORMAL HIGH (ref 0.61–1.24)
GFR calc Af Amer: 41 mL/min — ABNORMAL LOW (ref >60)
GFR calc non Af Amer: 36 mL/min — ABNORMAL LOW (ref >60)
Glucose, Bld: 100 mg/dL — ABNORMAL HIGH (ref 70–99)
Potassium: 4.4 mmol/L (ref 3.5–5.1)
Sodium: 140 mmol/L (ref 135–145)
Total Bilirubin: 0.9 mg/dL (ref 0.3–1.2)
Total Protein: 7 g/dL (ref 6.5–8.1)

## 2018-10-24 LAB — URINALYSIS, COMPLETE (UACMP) WITH MICROSCOPIC
Bacteria, UA: NONE SEEN
Bilirubin Urine: NEGATIVE
Glucose, UA: NEGATIVE mg/dL
Ketones, ur: NEGATIVE mg/dL
Nitrite: NEGATIVE
Protein, ur: NEGATIVE mg/dL
RBC / HPF: >50 RBC/hpf — ABNORMAL HIGH (ref 0–5)
Specific Gravity, Urine: 1.014 (ref 1.005–1.030)
Squamous Epithelial / HPF: NONE SEEN (ref 0–5)
pH: 5 (ref 5.0–8.0)

## 2018-10-24 LAB — CBC
HCT: 31.5 % — ABNORMAL LOW (ref 39.0–52.0)
Hemoglobin: 10.2 g/dL — ABNORMAL LOW (ref 13.0–17.0)
MCH: 33 pg (ref 26.0–34.0)
MCHC: 32.4 g/dL (ref 30.0–36.0)
MCV: 101.9 fL — ABNORMAL HIGH (ref 80.0–100.0)
Platelets: 122 10*3/uL — ABNORMAL LOW (ref 150–400)
RBC: 3.09 MIL/uL — ABNORMAL LOW (ref 4.22–5.81)
RDW: 14.6 % (ref 11.5–15.5)
WBC: 19.1 10*3/uL — ABNORMAL HIGH (ref 4.0–10.5)
nRBC: 0 % (ref 0.0–0.2)

## 2018-10-24 LAB — CREATININE, SERUM
Creatinine, Ser: 1.65 mg/dL — ABNORMAL HIGH (ref 0.61–1.24)
GFR calc Af Amer: 43 mL/min — ABNORMAL LOW (ref >60)
GFR calc non Af Amer: 37 mL/min — ABNORMAL LOW (ref >60)

## 2018-10-24 LAB — SARS CORONAVIRUS 2 BY RT PCR (HOSPITAL ORDER, PERFORMED IN ~~LOC~~ HOSPITAL LAB): SARS Coronavirus 2: NEGATIVE

## 2018-10-24 LAB — MRSA PCR SCREENING: MRSA by PCR: NEGATIVE

## 2018-10-24 LAB — PROCALCITONIN
Procalcitonin: 0.21 ng/mL
Procalcitonin: 2.33 ng/mL

## 2018-10-24 LAB — LACTIC ACID, PLASMA
Lactic Acid, Venous: 2.1 mmol/L (ref 0.5–1.9)
Lactic Acid, Venous: 3.4 mmol/L (ref 0.5–1.9)

## 2018-10-24 LAB — CORTISOL-AM, BLOOD: Cortisol - AM: 12.8 ug/dL (ref 6.7–22.6)

## 2018-10-24 LAB — BRAIN NATRIURETIC PEPTIDE: B Natriuretic Peptide: 168 pg/mL — ABNORMAL HIGH (ref 0.0–100.0)

## 2018-10-24 LAB — PROTIME-INR
INR: 1.1 (ref 0.8–1.2)
Prothrombin Time: 14.4 seconds (ref 11.4–15.2)

## 2018-10-24 LAB — LIPASE, BLOOD: Lipase: 30 U/L (ref 11–51)

## 2018-10-24 MED ORDER — VITAMIN B-12 1000 MCG PO TABS
1000.0000 ug | ORAL_TABLET | Freq: Every day | ORAL | Status: DC
  Administered 2018-10-24 – 2018-10-26 (×3): 1000 ug via ORAL
  Filled 2018-10-24 (×3): qty 1

## 2018-10-24 MED ORDER — ACETAMINOPHEN 325 MG PO TABS
650.0000 mg | ORAL_TABLET | Freq: Four times a day (QID) | ORAL | Status: DC | PRN
  Administered 2018-10-24 – 2018-10-26 (×3): 650 mg via ORAL
  Filled 2018-10-24 (×3): qty 2

## 2018-10-24 MED ORDER — IPRATROPIUM-ALBUTEROL 0.5-2.5 (3) MG/3ML IN SOLN: 3.0000 mL | Freq: Four times a day (QID) | RESPIRATORY_TRACT | Status: DC | PRN

## 2018-10-24 MED ORDER — SODIUM CHLORIDE 0.9 % IV SOLN: 2.0000 g | INTRAVENOUS | Status: DC

## 2018-10-24 MED ORDER — VANCOMYCIN HCL 10 G IV SOLR
2000.0000 mg | Freq: Once | INTRAVENOUS | Status: AC
  Administered 2018-10-24: 2000 mg via INTRAVENOUS
  Filled 2018-10-24: qty 2000

## 2018-10-24 MED ORDER — CLINDAMYCIN PHOSPHATE 600 MG/50ML IV SOLN
600.0000 mg | Freq: Once | INTRAVENOUS | Status: AC
  Administered 2018-10-24: 600 mg via INTRAVENOUS
  Filled 2018-10-24: qty 50

## 2018-10-24 MED ORDER — ENOXAPARIN SODIUM 40 MG/0.4ML ~~LOC~~ SOLN: 30.0000 mg | SUBCUTANEOUS | Status: DC

## 2018-10-24 MED ORDER — SODIUM CHLORIDE 0.9% FLUSH
3.0000 mL | Freq: Two times a day (BID) | INTRAVENOUS | Status: DC
  Administered 2018-10-24 – 2018-10-25 (×3): 3 mL via INTRAVENOUS

## 2018-10-24 MED ORDER — ONDANSETRON HCL 4 MG PO TABS: 4.0000 mg | ORAL_TABLET | Freq: Four times a day (QID) | ORAL | Status: DC | PRN

## 2018-10-24 MED ORDER — DOCUSATE SODIUM 100 MG PO CAPS
100.0000 mg | ORAL_CAPSULE | Freq: Every day | ORAL | Status: DC
  Administered 2018-10-24 – 2018-10-26 (×3): 100 mg via ORAL
  Filled 2018-10-24 (×3): qty 1

## 2018-10-24 MED ORDER — ADULT MULTIVITAMIN W/MINERALS CH
1.0000 | ORAL_TABLET | Freq: Every day | ORAL | Status: DC
  Administered 2018-10-24 – 2018-10-26 (×3): 1 via ORAL
  Filled 2018-10-24 (×3): qty 1

## 2018-10-24 MED ORDER — SODIUM CHLORIDE 0.9 % IV SOLN
INTRAVENOUS | Status: DC
  Administered 2018-10-24 – 2018-10-26 (×4): via INTRAVENOUS

## 2018-10-24 MED ORDER — SENNA 8.6 MG PO TABS: 1.0000 | ORAL_TABLET | Freq: Every day | ORAL | Status: DC | PRN

## 2018-10-24 MED ORDER — POLYETHYLENE GLYCOL 3350 17 G PO PACK: 17.0000 g | PACK | Freq: Every day | ORAL | Status: DC | PRN

## 2018-10-24 MED ORDER — SODIUM CHLORIDE 0.9 % IV SOLN: INTRAVENOUS | Status: DC

## 2018-10-24 MED ORDER — VANCOMYCIN HCL 10 G IV SOLR: 1500.0000 mg | INTRAVENOUS | Status: DC

## 2018-10-24 MED ORDER — ASPIRIN 81 MG PO CHEW
81.0000 mg | CHEWABLE_TABLET | Freq: Every day | ORAL | Status: DC
  Administered 2018-10-24 – 2018-10-26 (×3): 81 mg via ORAL
  Filled 2018-10-24 (×3): qty 1

## 2018-10-24 MED ORDER — VITAMIN B-1 100 MG PO TABS
100.0000 mg | ORAL_TABLET | Freq: Every day | ORAL | Status: DC
  Administered 2018-10-24 – 2018-10-26 (×3): 100 mg via ORAL
  Filled 2018-10-24 (×3): qty 1

## 2018-10-24 MED ORDER — PANTOPRAZOLE SODIUM 40 MG PO TBEC
40.0000 mg | DELAYED_RELEASE_TABLET | Freq: Every day | ORAL | Status: DC
  Administered 2018-10-24 – 2018-10-26 (×3): 40 mg via ORAL
  Filled 2018-10-24 (×3): qty 1

## 2018-10-24 MED ORDER — ENOXAPARIN SODIUM 40 MG/0.4ML ~~LOC~~ SOLN
40.0000 mg | SUBCUTANEOUS | Status: DC
  Administered 2018-10-24 – 2018-10-25 (×2): 40 mg via SUBCUTANEOUS
  Filled 2018-10-24 (×2): qty 0.4

## 2018-10-24 MED ORDER — SODIUM CHLORIDE 0.9 % IV SOLN: 500.0000 mg | INTRAVENOUS | Status: DC

## 2018-10-24 MED ORDER — ACETAMINOPHEN 500 MG PO TABS
ORAL_TABLET | ORAL | Status: AC
  Filled 2018-10-24: qty 2

## 2018-10-24 MED ORDER — ACETAMINOPHEN 650 MG RE SUPP: 650.0000 mg | Freq: Four times a day (QID) | RECTAL | Status: DC | PRN

## 2018-10-24 MED ORDER — SODIUM CHLORIDE 0.9 % IV SOLN
2.0000 g | Freq: Two times a day (BID) | INTRAVENOUS | Status: DC
  Administered 2018-10-24 – 2018-10-25 (×3): 2 g via INTRAVENOUS
  Filled 2018-10-24 (×4): qty 2

## 2018-10-24 MED ORDER — ONDANSETRON HCL 4 MG/2ML IJ SOLN: 4.0000 mg | Freq: Four times a day (QID) | INTRAMUSCULAR | Status: DC | PRN

## 2018-10-24 MED ORDER — ACETAMINOPHEN 500 MG PO TABS
1000.0000 mg | ORAL_TABLET | Freq: Once | ORAL | Status: AC
  Administered 2018-10-24: 1000 mg via ORAL

## 2018-10-24 MED ORDER — BISACODYL 5 MG PO TBEC: 5.0000 mg | DELAYED_RELEASE_TABLET | Freq: Every day | ORAL | Status: DC | PRN

## 2018-10-24 MED ORDER — TRAZODONE HCL 50 MG PO TABS: 25.0000 mg | ORAL_TABLET | Freq: Every evening | ORAL | Status: DC | PRN

## 2018-10-24 MED ORDER — TRAMADOL HCL 50 MG PO TABS
50.0000 mg | ORAL_TABLET | Freq: Two times a day (BID) | ORAL | Status: DC | PRN
  Administered 2018-10-24 – 2018-10-26 (×3): 50 mg via ORAL
  Filled 2018-10-24 (×3): qty 1

## 2018-10-24 MED ORDER — METRONIDAZOLE IN NACL 5-0.79 MG/ML-% IV SOLN
500.0000 mg | Freq: Three times a day (TID) | INTRAVENOUS | Status: DC
  Administered 2018-10-24: 500 mg via INTRAVENOUS
  Filled 2018-10-24 (×3): qty 100

## 2018-10-24 MED ORDER — VITAMIN D3 25 MCG (1000 UNIT) PO TABS
1000.0000 [IU] | ORAL_TABLET | Freq: Every day | ORAL | Status: DC
  Administered 2018-10-24 – 2018-10-26 (×3): 1000 [IU] via ORAL
  Filled 2018-10-24 (×6): qty 1

## 2018-10-24 MED ORDER — FOLIC ACID 1 MG PO TABS
1.0000 mg | ORAL_TABLET | Freq: Every day | ORAL | Status: DC
  Administered 2018-10-24 – 2018-10-26 (×3): 1 mg via ORAL
  Filled 2018-10-24 (×3): qty 1

## 2018-10-24 NOTE — Evaluation (Signed)
Occupational Therapy Evaluation Patient Details Name: Howard Harmon MRN: [id number] DOB: Nov 23, 1929 Today's Date: 10/24/2018    History of Present Illness presented to ER secondary to abdominal pain, nausea/vomiting, fever and hypoxia; admitted with sepsis related to PNA.  COVID-19 negative at admission.   Clinical Impression   Pt seen for OT evaluation this date. Prior to hospital admission, pt was ambulating with AD short household distances, indep with dressing and toileting, and his daughters were assisting (1 during the week, 1 on weekends) with bathing, meds, cooking, cleaning, and transportation. Pt reports he has someone check in on him at least 1x/day. Currently pt demonstrates impairments in strength, activity tolerance, balance, and arthritic pain impairing his ability to perform ADL at baseline level, requiring min-mod assist for LB ADL and mobility. Pt instructed in bladder mgt strategies to minimize risk of UTIs. Pt verbalized understanding and would benefit from skilled OT to address noted impairments and functional limitations (see below for any additional details) in order to maximize safety and independence while minimizing falls risk and caregiver burden.  Upon hospital discharge, recommend pt discharge to [place].    Follow Up Recommendations  SNF    Equipment Recommendations  3 in 1 bedside commode    Recommendations for Other Services       Precautions / Restrictions Precautions Precautions: Fall Restrictions Weight Bearing Restrictions: No      Mobility Bed Mobility Overal bed mobility: Needs Assistance Bed Mobility: Supine to Sit;Sit to Supine     Supine to sit: Min assist Sit to supine: Min assist      Transfers Overall transfer level: Needs assistance Equipment used: Rolling walker (2 wheeled) Transfers: Sit to/from Stand Sit to Stand: Min assist;Mod assist         General transfer comment: cuing to scoot to edge of bed, for hand placement and  overall safetly; multiple attempts, heavy use of momentum required    Balance Overall balance assessment: Needs assistance Sitting-balance support: No upper extremity supported;Feet supported Sitting balance-Leahy Scale: Good     Standing balance support: Bilateral upper extremity supported Standing balance-Leahy Scale: Poor                             ADL either performed or assessed with clinical judgement   ADL Overall ADL's : Needs assistance/impaired Eating/Feeding: Sitting;Independent   Grooming: Sitting;Supervision/safety   Upper Body Bathing: Sitting;Minimal assistance   Lower Body Bathing: Sit to/from stand;Minimal assistance;Moderate assistance   Upper Body Dressing : Sitting;Minimal assistance   Lower Body Dressing: Sit to/from stand;Minimal assistance;Moderate assistance   Toilet Transfer: BSC;RW;Ambulation;Minimal assistance;Moderate assistance                   Vision Baseline Vision/History: Wears glasses Wears Glasses: At all times Patient Visual Report: No change from baseline       Perception     Praxis      Pertinent Vitals/Pain Pain Assessment: 0-10 Pain Score: 5  Pain Location: both hands  Pain Descriptors / Indicators: Aching Pain Intervention(s): Limited activity within patient's tolerance;Monitored during session;Patient requesting pain meds-RN notified     Hand Dominance Right   Extremity/Trunk Assessment Upper Extremity Assessment Upper Extremity Assessment: Generalized weakness(grossly at least 4-/5 throughout; significant arthritic changes to bilat shoulders L > R)   Lower Extremity Assessment Lower Extremity Assessment: Generalized weakness(grossly 4-/5 throughout; significant valgus to R knee)       Communication Communication Communication: No difficulties  Cognition Arousal/Alertness: Awake/alert Behavior During Therapy: WFL for tasks assessed/performed Overall Cognitive Status: Within Functional  Limits for tasks assessed                                     General Comments       Exercises Other Exercises Other Exercises: pt instructed in bladder mgt strategies and importance of hydration and thoroughness of hygiene to minimize risk of UTIs   Shoulder Instructions      Home Living Family/patient expects to be discharged to:: Private residence Living Arrangements: Alone Available Help at Discharge: Family;Available PRN/intermittently(daughters provide frequent check ni) Type of Home: House Home Access: Stairs to enter [employer] of Steps: 4 Entrance Stairs-Rails: Right Home Layout: One level     Bathroom Shower/Tub: Occupational psychologist: Handicapped height     Home Equipment: Environmental consultant - 4 wheels;Cane - single point;Shower seat;Grab bars - tub/shower;Grab bars - toilet          Prior Functioning/Environment Level of Independence: Independent with assistive device(s);Needs assistance  Gait / Transfers Assistance Needed: Mod indep with 4WRW outside the home, SPC in the home for simple ADLs and limited household distances ADL's / Homemaking Assistance Needed: pt indep with dressing, toileting; daughter assists w/ tub transfer and bathing, cooking, cleaning, and transportation   Comments: denies fall history within previous 6 months.  Home O2 at night.        OT Problem List: Decreased knowledge of use of DME or AE;Decreased strength;Pain;Impaired balance (sitting and/or standing);Decreased safety awareness;Decreased activity tolerance;Impaired UE functional use      OT Treatment/Interventions: Self-care/ADL training;Balance training;Therapeutic exercise;Therapeutic activities;DME and/or AE instruction;Patient/family education;Energy conservation    OT Goals(Current goals can be found in the care plan section) Acute Rehab OT Goals Patient Stated Goal: to go home OT Goal Formulation: With patient Time For Goal Achievement:  11/07/18 Potential to Achieve Goals: Good ADL Goals Pt Will Perform Lower Body Dressing: with min guard assist;sit to/from stand Pt Will Transfer to Toilet: with min assist;ambulating(BSC over toilet, LRAD for amn) Additional ADL Goal #1: Pt will verbalize plan to implement at least 1 learned baldder mgt strategy to maximize safety/independence with bladder mgt and minimize risk of future UTIs.  OT Frequency: Min 1X/week   Barriers to D/C:            Co-evaluation              AM-PAC OT "6 Clicks" Daily Activity     Outcome Measure Help from another person eating meals?: None Help from another person taking care of personal grooming?: A Little Help from another person toileting, which includes using toliet, bedpan, or urinal?: A Lot Help from another person bathing (including washing, rinsing, drying)?: A Lot Help from another person to put on and taking off regular upper body clothing?: A Little Help from another person to put on and taking off regular lower body clothing?: A Lot 6 Click Score: 16   End of Session Nurse Communication: Patient requests pain meds  Activity Tolerance: Patient tolerated treatment well Patient left: in bed;with call bell/phone within reach;with bed alarm set  OT Visit Diagnosis: Other abnormalities of gait and mobility (R26.89);Muscle weakness (generalized) (M62.81);Pain Pain - Right/Left: Right(both) Pain - part of body: Hand                Time: 1540-1556 OT Time Calculation (min): 16 min Charges:  OT General Charges $OT Visit: 1 Visit OT Evaluation $OT Eval Low Complexity: 1 Low OT Treatments $Self Care/Home Management : 8-22 mins  Jeni Salles, MPH, MS, OTR/L ascom [phone] 10/24/18, 4:22 PM

## 2018-10-24 NOTE — Evaluation (Signed)
Physical Therapy Evaluation Patient Details Name: Deronte Solis MRN: [id number] DOB: Jan 06, 1930 Today's Date: 10/24/2018   History of Present Illness  presented to ER secondary to abdominal pain, nausea/vomiting, fever and hypoxia; admitted with sepsis related to PNA.  COVID-19 negative at admission.  Clinical Impression  Upon evaluation, patient alert and oriented to basic information; follows commands, but somewhat impulsive at times.  Generally weak and deconditioned throughout with significant arthritic changes noted to bilat shoulders and R > L knee.  Poor dynamic standing balance, requiring UE support and +1 for safety.  Currently requiring min/mod assist +1 (second person for chair follow) with RW for sit/stand, standing balance, basic transfers and gait (65').  Demonstrates forward trunk flexion with inconsistent step height/length, poor postural control and balance, poor overall walker control (maintains anterior to BOS throughout distance).  1-2 LOB requiring mod assist from therapist to correct. Unsafe to attempt without RW and +1 at all times. Mod SOB with minimal exertion, though difficulty obtaining exertional sats due to poor signal/readings.  Once good signal obtained, sats recovered >90% on RA.  Will continue to monitor in subsequent sessions. Would benefit from skilled PT to address above deficits and promote optimal return to PLOF; recommend transition to STR upon discharge from acute hospitalization.     Follow Up Recommendations SNF    Equipment Recommendations       Recommendations for Other Services       Precautions / Restrictions Precautions Precautions: Fall Restrictions Weight Bearing Restrictions: No      Mobility  Bed Mobility Overal bed mobility: Needs Assistance Bed Mobility: Supine to Sit     Supine to sit: Min assist        Transfers Overall transfer level: Needs assistance Equipment used: Rolling walker (2 wheeled) Transfers: Sit to/from  Stand Sit to Stand: Min assist;Mod assist         General transfer comment: cuing to scoot to edge of bed, for hand placement and overall safetly; multiple attempts, heavy use of momentum required  Ambulation/Gait Ambulation/Gait assistance: Min assist;Mod assist Gait Distance (Feet): 65 Feet Assistive device: Rolling walker (2 wheeled)       General Gait Details: forward trunk flexion with inconsistent step height/length, poor postural control and balance, poor overall walker control (maintains anterior to BOS throughout distance).  1-2 LOB requiring mod assist from therapist to correct. Unsafe to attempt without RW and +1 at all times.  Stairs            Wheelchair Mobility    Modified Rankin (Stroke Patients Only)       Balance Overall balance assessment: Needs assistance Sitting-balance support: No upper extremity supported;Feet supported Sitting balance-Leahy Scale: Good     Standing balance support: Bilateral upper extremity supported Standing balance-Leahy Scale: Poor                               Pertinent Vitals/Pain Pain Assessment: No/denies pain    Home Living Family/patient expects to be discharged to:: Private residence Living Arrangements: Alone Available Help at Discharge: Family;Available PRN/intermittently(daughters provide frequent check ni) Type of Home: House Home Access: Stairs to enter Entrance Stairs-Rails: Right Entrance Stairs-Number of Steps: 4 Home Layout: One level Home Equipment: Walker - 4 wheels      Prior Function Level of Independence: Independent with assistive device(s)         Comments: Mod indep with 4WRW for simple ADLs and limited household distances;  denies fall history within previous 6 months.  Home O2 at night.     Hand Dominance        Extremity/Trunk Assessment   Upper Extremity Assessment Upper Extremity Assessment: Generalized weakness(grossly at least 4-/5 throughout; significant  arthritic changes to bilat shoulders L > R)    Lower Extremity Assessment Lower Extremity Assessment: Generalized weakness(grossly 4-/5 throughout; significant valgus to R knee)       Communication   Communication: No difficulties  Cognition Arousal/Alertness: Awake/alert Behavior During Therapy: WFL for tasks assessed/performed Overall Cognitive Status: Within Functional Limits for tasks assessed                                        General Comments      Exercises Other Exercises Other Exercises: Sit/stand x3 with RW, min/mod progressing to min assist-emphasis on hand placement, mechanics and overall safety. Limited carry-over between repetitions   Assessment/Plan    PT Assessment Patient needs continued PT services  PT Problem List Decreased strength;Decreased range of motion;Decreased activity tolerance;Decreased balance;Decreased mobility;Decreased knowledge of use of DME;Decreased safety awareness;Decreased knowledge of precautions;Cardiopulmonary status limiting activity       PT Treatment Interventions DME instruction;Gait training;Stair training;Functional mobility training;Therapeutic activities;Therapeutic exercise;Balance training;Patient/family education    PT Goals (Current goals can be found in the Care Plan section)  Acute Rehab PT Goals Patient Stated Goal: to go home PT Goal Formulation: With patient Time For Goal Achievement: 11/07/18 Potential to Achieve Goals: Fair    Frequency Min 2X/week   Barriers to discharge Decreased caregiver support      Co-evaluation               AM-PAC PT "6 Clicks" Mobility  Outcome Measure Help needed turning from your back to your side while in a flat bed without using bedrails?: A Little Help needed moving from lying on your back to sitting on the side of a flat bed without using bedrails?: A Little Help needed moving to and from a bed to a chair (including a wheelchair)?: A Little Help  needed standing up from a chair using your arms (e.g., wheelchair or bedside chair)?: A Little Help needed to walk in hospital room?: A Little Help needed climbing 3-5 steps with a railing? : A Lot 6 Click Score: 17    End of Session Equipment Utilized During Treatment: Gait belt Activity Tolerance: Patient tolerated treatment well;Patient limited by fatigue Patient left: in chair;with call bell/phone within reach;with chair alarm set Nurse Communication: Mobility status PT Visit Diagnosis: Muscle weakness (generalized) (M62.81);Difficulty in walking, not elsewhere classified (R26.2)    Time: 4097-3532 PT Time Calculation (min) (ACUTE ONLY): 24 min   Charges:   PT Evaluation $PT Eval Moderate Complexity: 1 Mod PT Treatments $Therapeutic Activity: 8-22 mins        Sonam Huelsmann H. Owens Shark, PT, DPT, NCS 10/24/18, 2:45 PM [phone]

## 2018-10-24 NOTE — NC FL2 (Signed)
[place] LEVEL OF CARE SCREENING TOOL     IDENTIFICATION  Patient Name: Howard Harmon Birthdate: [birth year] Sex: male Admission Date (Current Location): 10/23/2018  [place] and [place] Number:  Engineering geologist and Address:  [facility], [address] [place], [place] [address]      Provider Number: [id number]  Attending Physician Name and Address:  Demetrios Loll, MD  Relative Name and Phone Number:       Current Level of Care: SNF Recommended Level of Care: [place] Prior Approval Number:    Date Approved/Denied:   PASRR Number:    Discharge Plan: SNF    Current Diagnoses: Patient Active Problem List   Diagnosis Date Noted  . Sepsis ([place]) 10/24/2018  . Anemia   . Grade II hemorrhoids   . Acute posthemorrhagic anemia   . Hypoxia 10/23/2017  . Hematochezia   . Benign neoplasm of ascending colon   . Atrial fibrillation ([place]) 08/31/2016  . Sinoatrial node dysfunction ([place]) 08/31/2016  . History of DVT of lower extremity 06/14/2016  . Aortic atherosclerosis ([place]) 11/10/2015  . Chronic respiratory failure with hypoxia ([place]) 11/09/2015  . Lumbar stenosis with neurogenic claudication 04/01/2015  . DDD (degenerative disc disease), lumbosacral 01/26/2015  . Rectal bleed 01/01/2015  . GIB (gastrointestinal bleeding) 10/12/2014  . 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  . CKD (chronic kidney disease), stage III ([place]) 10/12/2014  . 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  . HTN (hypertension) 11/07/2013  . OA (osteoarthritis) of knee 11/07/2013  . Peripheral sensory neuropathy 11/07/2013  . Malignant melanoma of skin of chest ([place]) 08/06/2013    Orientation RESPIRATION BLADDER Height & Weight     Self, Time, Situation, Place  O2 Continent Weight: 81.6 kg Height:  5\' 11"  (180.3 cm)  BEHAVIORAL SYMPTOMS/MOOD NEUROLOGICAL BOWEL NUTRITION STATUS      Continent Diet(Heart Healthy)   AMBULATORY STATUS COMMUNICATION OF NEEDS Skin   Extensive Assist Verbally Normal                       Personal Care Assistance Level of Assistance  Bathing, Feeding, Dressing Bathing Assistance: Limited assistance Feeding assistance: Independent Dressing Assistance: Limited assistance     Functional Limitations Info  Hearing   Hearing Info: Impaired      SPECIAL CARE FACTORS FREQUENCY  PT (By licensed PT), OT (By licensed OT)     PT Frequency: 5 OT Frequency: 5            Contractures Contractures Info: Not present    Additional Factors Info  Code Status, Allergies Code Status Info: Full Allergies Info: Warfarin           Current Medications (10/24/2018):  This is the current hospital active medication list Current Facility-Administered Medications  Medication Dose Route Frequency Provider Last Rate Last Dose  . 0.9 %  sodium chloride infusion   Intravenous Continuous Demetrios Loll, MD 75 mL/hr at 10/24/18 0720    . acetaminophen (TYLENOL) tablet 650 mg  650 mg Oral Q6H PRN Seals, Theo Dills, NP       Or  . acetaminophen (TYLENOL) suppository 650 mg  650 mg Rectal Q6H PRN Seals, Theo Dills, NP      . aspirin chewable tablet 81 mg  81 mg Oral Daily Seals, Levada Dy H, NP   81 mg at 10/24/18 0940  . bisacodyl (DULCOLAX) EC tablet 5 mg  5 mg Oral Daily PRN Demetrios Loll, MD      .  ceFEPIme (MAXIPIME) 2 g in sodium chloride 0.9 % 100 mL IVPB  2 g Intravenous Q12H Seals, Theo Dills, NP   Stopped at 10/24/18 0427  . cholecalciferol (VITAMIN D) tablet 1,000 Units  1,000 Units Oral Daily Mayer Camel, NP   1,000 Units at 10/24/18 0940  . docusate sodium (COLACE) capsule 100-200 mg  100-200 mg Oral Daily Seals, Angela H, NP   100 mg at 10/24/18 0940  . enoxaparin (LOVENOX) injection 40 mg  40 mg Subcutaneous Q24H Dallie Piles, [facility]      . folic acid (FOLVITE) tablet 1 mg  1 mg Oral Daily Seals, Theo Dills, NP   1 mg at 10/24/18 0940  . ipratropium-albuterol (DUONEB) 0.5-2.5 (3)  MG/3ML nebulizer solution 3 mL  3 mL Nebulization Q6H PRN Seals, Theo Dills, NP      . multivitamin with minerals tablet 1 tablet  1 tablet Oral Daily Seals, Theo Dills, NP   1 tablet at 10/24/18 0940  . ondansetron (ZOFRAN) tablet 4 mg  4 mg Oral Q6H PRN Seals, Theo Dills, NP       Or  . ondansetron (ZOFRAN) injection 4 mg  4 mg Intravenous Q6H PRN Seals, Theo Dills, NP      . pantoprazole (PROTONIX) EC tablet 40 mg  40 mg Oral Daily Seals, Angela H, NP   40 mg at 10/24/18 0940  . polyethylene glycol (MIRALAX / GLYCOLAX) packet 17 g  17 g Oral Daily PRN Seals, Theo Dills, NP      . senna (SENOKOT) tablet 8.6 mg  1 tablet Oral Daily PRN Demetrios Loll, MD      . sodium chloride flush (NS) 0.9 % injection 3 mL  3 mL Intravenous Q12H Seals, Angela H, NP   3 mL at 10/24/18 0331  . thiamine (VITAMIN B-1) tablet 100 mg  100 mg Oral Daily Seals, Angela H, NP   100 mg at 10/24/18 0940  . traMADol (ULTRAM) tablet 50 mg  50 mg Oral Q12H PRN Gardiner Barefoot H, NP   50 mg at 10/24/18 0947  . traZODone (DESYREL) tablet 25 mg  25 mg Oral QHS PRN Seals, Theo Dills, NP      . vitamin B-12 (CYANOCOBALAMIN) tablet 1,000 mcg  1,000 mcg Oral Daily Seals, Theo Dills, NP   1,000 mcg at 10/24/18 0940     Discharge Medications: Please see discharge summary for a list of discharge medications.  Relevant Imaging Results:  Relevant Lab Results:   Additional Information SS [id number]  Beverly Sessions, RN

## 2018-10-24 NOTE — ED Notes (Signed)
ED TO INPATIENT HANDOFF REPORT  ED Nurse Name and Phone #: Altamese Owasso RN [phone]  S Name/Age/Gender Howard Harmon 85 y.o. male Room/Bed: ED26A/ED26A  Code Status   Code Status: Full Code  Home/SNF/Other Home Patient oriented to: self, place, time and situation Is this baseline? Yes   Triage Complete: Triage complete  Chief Complaint Fever, Vomiting, SOB  Triage Note Pt presents to ED from home with c/o vomiting X3, chills, and fever this evening. Pt fever was +101 at home and O2 level was said to be low per daughter. Pt alert and calm during triage. Able to answer questions at this time. Denies sob. No increased work of breathing noted.    Allergies Allergies  Allergen Reactions  . Warfarin Other (See Comments)    Bleeding diff to stop    Level of Care/Admitting Diagnosis ED Disposition    ED Disposition Condition [facility] Area: [place] [[id number]]  Level of Care: Med-Surg [16]  Covid Evaluation: N/A  Diagnosis: Sepsis [facility]) [[id number]]  Admitting Physician: Christel Mormon [[id number]]  Attending Physician: Christel Mormon [[id number]]  Estimated length of stay: 3 - 4 days  Certification:: I certify this patient will need inpatient services for at least 2 midnights  PT Class (Do Not Modify): Inpatient [101]  PT Acc Code (Do Not Modify): Private [1]       B Medical/Surgery History Past Medical History:  Diagnosis Date  . Arthritis   . Atrial fibrillation ([place])   . Blood clotting tendency ([place])   . Chronic kidney disease   . DVT (deep venous thrombosis) ([place])   . Hypertension   . Melanoma ([place])   . Pacemaker    Past Surgical History:  Procedure Laterality Date  . CHOLECYSTECTOMY    . COLONOSCOPY WITH PROPOFOL N/A 10/15/2014   Procedure: COLONOSCOPY WITH PROPOFOL;  Surgeon: Josefine Class, MD;  Location: [facility] ENDOSCOPY;  Service: Endoscopy;  Laterality: N/A;  . COLONOSCOPY WITH PROPOFOL N/A 10/23/2017   Procedure:  COLONOSCOPY WITH PROPOFOL;  Surgeon: Lucilla Lame, MD;  Location: [facility] ENDOSCOPY;  Service: Endoscopy;  Laterality: N/A;  . ESOPHAGOGASTRODUODENOSCOPY N/A 10/24/2017   Procedure: ESOPHAGOGASTRODUODENOSCOPY (EGD);  Surgeon: Virgel Manifold, MD;  Location: [facility] ENDOSCOPY;  Service: Endoscopy;  Laterality: N/A;  . INSERT / REPLACE / REMOVE PACEMAKER       A IV Location/Drains/Wounds Patient Lines/Drains/Airways Status   Active Line/Drains/Airways    Name:   Placement date:   Placement time:   Site:   Days:   Peripheral IV 10/24/18 Right Antecubital   10/24/18    0000    Antecubital   less than 1   Peripheral IV 10/24/18 Left Hand   10/24/18    0005    Hand   less than 1   Airway   10/23/17    0912     366          Intake/Output Last 24 hours  Intake/Output Summary (Last 24 hours) at 10/24/2018 0206 Last data filed at 10/24/2018 0144 Gross per 24 hour  Intake 2150 ml  Output -  Net 2150 ml    Labs/Imaging Results for orders placed or performed during the hospital encounter of 10/23/18 (from the past 48 hour(s))  Comprehensive metabolic panel     Status: Abnormal   Collection Time: 10/23/18 12:02 AM  Result Value Ref Range   Sodium 140 135 - 145 mmol/L   Potassium 4.4 3.5 - 5.1 mmol/L   Chloride  107 98 - 111 mmol/L   CO2 23 22 - 32 mmol/L   Glucose, Bld 100 (H) 70 - 99 mg/dL   BUN 32 (H) 8 - 23 mg/dL   Creatinine, Ser 1.70 (H) 0.61 - 1.24 mg/dL   Calcium 9.0 8.9 - 10.3 mg/dL   Total Protein 7.0 6.5 - 8.1 g/dL   Albumin 4.0 3.5 - 5.0 g/dL   AST 22 15 - 41 U/L   ALT 15 0 - 44 U/L   Alkaline Phosphatase 61 38 - 126 U/L   Total Bilirubin 0.9 0.3 - 1.2 mg/dL   GFR calc non Af Amer 36 (L) >60 mL/min   GFR calc Af Amer 41 (L) >60 mL/min   Anion gap 10 5 - 15    Comment: Performed at [facility], [address] [place], [place] [address]  Lipase, blood     Status: None   Collection Time: 10/23/18 12:02 AM  Result Value Ref Range   Lipase 30 11 - 51 U/L     Comment: Performed at [facility], [address] [place], [place] [address]  Brain natriuretic peptide     Status: Abnormal   Collection Time: 10/23/18 12:02 AM  Result Value Ref Range   B Natriuretic Peptide 168.0 (H) 0.0 - 100.0 pg/mL    Comment: Performed at [facility], [address] [place], [place] [address]  CBC WITH DIFFERENTIAL     Status: Abnormal   Collection Time: 10/23/18 12:02 AM  Result Value Ref Range   WBC 14.8 (H) 4.0 - 10.5 K/uL   RBC 3.55 (L) 4.22 - 5.81 MIL/uL   Hemoglobin 11.7 (L) 13.0 - 17.0 g/dL   HCT 35.8 (L) 39.0 - 52.0 %   MCV 100.8 (H) 80.0 - 100.0 fL   MCH 33.0 26.0 - 34.0 pg   MCHC 32.7 30.0 - 36.0 g/dL   RDW 14.3 11.5 - 15.5 %   Platelets 125 (L) 150 - 400 K/uL   nRBC 0.0 0.0 - 0.2 %   Neutrophils Relative % 91 %   Neutro Abs 13.6 (H) 1.7 - 7.7 K/uL   Lymphocytes Relative 3 %   Lymphs Abs 0.4 (L) 0.7 - 4.0 K/uL   Monocytes Relative 5 %   Monocytes Absolute 0.7 0.1 - 1.0 K/uL   Eosinophils Relative 0 %   Eosinophils Absolute 0.0 0.0 - 0.5 K/uL   Basophils Relative 0 %   Basophils Absolute 0.0 0.0 - 0.1 K/uL   Immature Granulocytes 1 %   Abs Immature Granulocytes 0.08 (H) 0.00 - 0.07 K/uL    Comment: Performed at [facility], [place], [place] [address]  Procalcitonin     Status: None   Collection Time: 10/23/18 12:02 AM  Result Value Ref Range   Procalcitonin 0.21 ng/mL    Comment:        Interpretation: PCT (Procalcitonin) <= 0.5 ng/mL: Systemic infection (sepsis) is not likely. Local bacterial infection is possible. (NOTE)       Sepsis PCT Algorithm           Lower Respiratory Tract                                      Infection PCT Algorithm    ----------------------------     ----------------------------         PCT < 0.25 ng/mL  PCT < 0.10 ng/mL         Strongly encourage             Strongly discourage   discontinuation of antibiotics    initiation of antibiotics     ----------------------------     -----------------------------       PCT 0.25 - 0.50 ng/mL            PCT 0.10 - 0.25 ng/mL               OR       >80% decrease in PCT            Discourage initiation of                                            antibiotics      Encourage discontinuation           of antibiotics    ----------------------------     -----------------------------         PCT >= 0.50 ng/mL              PCT 0.26 - 0.50 ng/mL               AND        <80% decrease in PCT             Encourage initiation of                                             antibiotics       Encourage continuation           of antibiotics    ----------------------------     -----------------------------        PCT >= 0.50 ng/mL                  PCT > 0.50 ng/mL               AND         increase in PCT                  Strongly encourage                                      initiation of antibiotics    Strongly encourage escalation           of antibiotics                                     -----------------------------                                           PCT <= 0.25 ng/mL                                                 OR                                        >  80% decrease in PCT                                     Discontinue / Do not initiate                                             antibiotics Performed at [facility], [address] [place], [place] [address]   Lactic acid, plasma     Status: Abnormal   Collection Time: 10/24/18 12:02 AM  Result Value Ref Range   Lactic Acid, Venous 3.4 (HH) 0.5 - 1.9 mmol/L    Comment: CRITICAL RESULT CALLED TO, READ BACK BY AND VERIFIED WITH IZZY MOHAMED AT [place] ON 10/24/18 RWW Performed at [facility], [address], [place], [place] [address]   SARS Coronavirus 2 (CEPHEID- Performed in [place] hospital lab), Hosp Order     Status: None   Collection Time: 10/24/18 12:02 AM  Result Value Ref Range   SARS  Coronavirus 2 NEGATIVE NEGATIVE    Comment: (NOTE) If result is NEGATIVE SARS-CoV-2 target nucleic acids are NOT DETECTED. The SARS-CoV-2 RNA is generally detectable in upper and lower  respiratory specimens during the acute phase of infection. The lowest  concentration of SARS-CoV-2 viral copies this assay can detect is 250  copies / mL. A negative result does not preclude SARS-CoV-2 infection  and should not be used as the sole basis for treatment or other  patient management decisions.  A negative result may occur with  improper specimen collection / handling, submission of specimen other  than nasopharyngeal swab, presence of viral mutation(s) within the  areas targeted by this assay, and inadequate number of viral copies  (<250 copies / mL). A negative result must be combined with clinical  observations, patient history, and epidemiological information. If result is POSITIVE SARS-CoV-2 target nucleic acids are DETECTED. The SARS-CoV-2 RNA is generally detectable in upper and lower  respiratory specimens dur ing the acute phase of infection.  Positive  results are indicative of active infection with SARS-CoV-2.  Clinical  correlation with patient history and other diagnostic information is  necessary to determine patient infection status.  Positive results do  not rule out bacterial infection or co-infection with other viruses. If result is PRESUMPTIVE POSTIVE SARS-CoV-2 nucleic acids MAY BE PRESENT.   A presumptive positive result was obtained on the submitted specimen  and confirmed on repeat testing.  While 2019 novel coronavirus  (SARS-CoV-2) nucleic acids may be present in the submitted sample  additional confirmatory testing may be necessary for epidemiological  and / or clinical management purposes  to differentiate between  SARS-CoV-2 and other Sarbecovirus currently known to infect humans.  If clinically indicated additional testing with an alternate test  methodology  [phone]) is advised. The SARS-CoV-2 RNA is generally  detectable in upper and lower respiratory sp ecimens during the acute  phase of infection. The expected result is Negative. Fact Sheet for Patients:  StrictlyIdeas.no Fact Sheet for Healthcare Providers: BankingDealers.co.za This test is not yet approved or cleared by the [place] FDA and has been authorized for detection and/or diagnosis of SARS-CoV-2 by FDA under an Emergency Use Authorization (EUA).  This EUA will remain in effect (meaning this test can be used) for the duration of the  COVID-19 declaration under Section 564(b)(1) of the Act, 21 U.S.C. section 360bbb-3(b)(1), unless the authorization is terminated or revoked sooner. Performed at [facility], [address] [place], [place] [address]   Urinalysis, Complete w Microscopic     Status: Abnormal   Collection Time: 10/24/18  1:18 AM  Result Value Ref Range   Color, Urine YELLOW (A) YELLOW   APPearance HAZY (A) CLEAR   Specific Gravity, Urine 1.014 1.005 - 1.030   pH 5.0 5.0 - 8.0   Glucose, UA NEGATIVE NEGATIVE mg/dL   Hgb urine dipstick MODERATE (A) NEGATIVE   Bilirubin Urine NEGATIVE NEGATIVE   Ketones, ur NEGATIVE NEGATIVE mg/dL   Protein, ur NEGATIVE NEGATIVE mg/dL   Nitrite NEGATIVE NEGATIVE   Leukocytes,Ua SMALL (A) NEGATIVE   RBC / HPF >50 (H) 0 - 5 RBC/hpf   WBC, UA 21-50 0 - 5 WBC/hpf   Bacteria, UA NONE SEEN NONE SEEN   Squamous Epithelial / LPF NONE SEEN 0 - 5   Mucus PRESENT     Comment: Performed at [facility], [address] [place], [place] [address]   Dg Chest Port 1 View  Result Date: 10/24/2018 CLINICAL DATA:  85 year old male with fever and vomiting. EXAM: PORTABLE CHEST 1 VIEW COMPARISON:  CTA chest 03/27/2018 and earlier. FINDINGS: Portable AP upright view at 0001 hours. Stable lung volumes and mediastinal contours. Mild cardiomegaly. Visualized tracheal air column  is within normal limits. Stable left chest cardiac pacemaker. Allowing for portable technique the lungs are clear. No pneumothorax. Advanced degenerative changes at both shoulders. Negative visible bowel gas pattern. IMPRESSION: No acute cardiopulmonary abnormality. Electronically Signed   By: Genevie Ann M.D.   On: 10/24/2018 00:24    Pending Labs Unresulted Labs (From admission, onward)    Start     Ordered   10/31/18 0500  Creatinine, serum  (enoxaparin (LOVENOX)    CrCl >/= 30 ml/min)  Weekly,   STAT    Comments:  while on enoxaparin therapy    10/24/18 0150   10/24/18 0500  Protime-INR  Tomorrow morning,   STAT     10/24/18 0150   10/24/18 0500  Cortisol-am, blood  Tomorrow morning,   STAT     10/24/18 0150   10/24/18 0500  Procalcitonin  Tomorrow morning,   STAT     10/24/18 0150   10/24/18 0151  CBC  (enoxaparin (LOVENOX)    CrCl >/= 30 ml/min)  Once,   STAT    Comments:  Baseline for enoxaparin therapy IF NOT ALREADY DRAWN.  Notify MD if PLT < 100 K.    10/24/18 0150   10/24/18 0151  Creatinine, serum  (enoxaparin (LOVENOX)    CrCl >/= 30 ml/min)  Once,   STAT    Comments:  Baseline for enoxaparin therapy IF NOT ALREADY DRAWN.    10/24/18 0150   10/24/18 0151  Urine culture  Once,   STAT     10/24/18 0150   10/24/18 0151  Culture, sputum-assessment  Once,   STAT     10/24/18 0150   10/23/18 2345  Lactic acid, plasma  Now then every 2 hours,   STAT     10/23/18 2345   10/23/18 2345  Blood Culture (routine x 2)  BLOOD CULTURE X 2,   STAT     10/23/18 2345   10/23/18 2345  Urine culture  ONCE - STAT,   STAT     10/23/18 2345  Vitals/Pain Today's Vitals   10/24/18 0015 10/24/18 0028 10/24/18 0144 10/24/18 0145  BP:    126/84  Pulse: 61 68  100  Resp: (!) 28 20  16   Temp:  (!) 101.9 F (38.8 C)  98.4 F (36.9 C)  TempSrc:  Rectal  Oral  SpO2: 100% 100%  92%  Weight:      Height:      PainSc:   0-No pain 0-No pain    Isolation Precautions Droplet and  Contact precautions  Medications Medications  cefTRIAXone (ROCEPHIN) 2 g in sodium chloride 0.9 % 100 mL IVPB (0 g Intravenous Stopped 10/24/18 0103)  azithromycin (ZITHROMAX) 500 mg in sodium chloride 0.9 % 250 mL IVPB (500 mg Intravenous New Bag/Given 10/24/18 0100)  aspirin chewable tablet 81 mg (has no administration in time range)  cholecalciferol (VITAMIN D) tablet 1,000 Units (has no administration in time range)  docusate sodium (COLACE) capsule 100-200 mg (has no administration in time range)  pantoprazole (PROTONIX) EC tablet 40 mg (has no administration in time range)  vitamin B-12 (CYANOCOBALAMIN) tablet 1,000 mcg (has no administration in time range)  enoxaparin (LOVENOX) injection 30 mg (has no administration in time range)  sodium chloride flush (NS) 0.9 % injection 3 mL (has no administration in time range)  0.9 %  sodium chloride infusion (has no administration in time range)  acetaminophen (TYLENOL) tablet 650 mg (has no administration in time range)    Or  acetaminophen (TYLENOL) suppository 650 mg (has no administration in time range)  traMADol (ULTRAM) tablet 50 mg (has no administration in time range)  traZODone (DESYREL) tablet 25 mg (has no administration in time range)  polyethylene glycol (MIRALAX / GLYCOLAX) packet 17 g (has no administration in time range)  ondansetron (ZOFRAN) tablet 4 mg (has no administration in time range)    Or  ondansetron (ZOFRAN) injection 4 mg (has no administration in time range)  folic acid (FOLVITE) tablet 1 mg (has no administration in time range)  multivitamin with minerals tablet 1 tablet (has no administration in time range)  thiamine (VITAMIN B-1) tablet 100 mg (has no administration in time range)  sodium chloride 0.9 % bolus 1,000 mL (0 mLs Intravenous Stopped 10/24/18 0102)    And  sodium chloride 0.9 % bolus 1,000 mL (0 mLs Intravenous Stopped 10/24/18 0143)    And  sodium chloride 0.9 % bolus 500 mL (500 mLs Intravenous New  Bag/Given 10/24/18 0113)  acetaminophen (TYLENOL) tablet 1,000 mg (1,000 mg Oral Given 10/24/18 0034)  clindamycin (CLEOCIN) IVPB 600 mg (0 mg Intravenous Stopped 10/24/18 0144)    Mobility walks with device Moderate fall risk   Focused Assessments    R Recommendations: See Admitting Provider Note  Report given to:   Additional Notes: None

## 2018-10-24 NOTE — Progress Notes (Signed)
Pharmacy Antibiotic Note  Howard Harmon is a 85 y.o. male admitted on 10/23/2018 with sepsis.  Pharmacy has been consulted for vanc/cefepime dosing. Patient received vanc 2g IV load, cefepime 2g, and flagyl 500 mg IV x 1 in ED in addition to clindamycin, azithromycin, and ceftriaxone. All other abx will be d/c'd except for the above that have been consulted for ie the vanc and cefepime.  Plan: Patient received vanc 2g load, cefepime 2g, and flagyl 500 mg IV in ED  Vancomycin 1500 mg IV Q 36 hrs. Goal AUC 400-550. Expected AUC: 532.6 SCr used: 1.7 Cssmin: 12.4  Will continue cefepime 2g IV q12h per CrCl 30 - 60 ml/min and flagyl 500 mg IV q8h for anaerobic coverage considering patient is a code sepsis and the source is not yet identified. CXR negative, UA negative, patient did mention vomiting; however, no US/CT abdomen done yet. Will continue to monitor renal fx and adjust doses as appropriate and will continue to monitor s/sx of infx.  Height: 5\' 11"  (180.3 cm) Weight: 180 lb (81.6 kg) IBW/kg (Calculated) : 75.3  Temp (24hrs), Avg:99.1 F (37.3 C), Min:97.2 F (36.2 C), Max:101.9 F (38.8 C)  Recent Labs  Lab 10/23/18 0002 10/24/18 0002 10/24/18 0154  WBC 14.8*  --   --   CREATININE 1.70*  --   --   LATICACIDVEN  --  3.4* 2.1*    Estimated Creatinine Clearance: 33.2 mL/min (A) (by C-G formula based on SCr of 1.7 mg/dL (H)).    Allergies  Allergen Reactions  . Warfarin Other (See Comments)    Bleeding diff to stop    Thank you for allowing pharmacy to be a part of this patient's care.  Tobie Lords, PharmD, BCPS Clinical Pharmacist 10/24/2018

## 2018-10-24 NOTE — H&P (Addendum)
Attending physician admission note:  I have seen and examined the patient with Ms. Gardiner Barefoot, CRNP.  The patient presents with lower abdominal pain and nausea and vomiting as well as fever that was up to 101 at home and 101.9 here.  He was noted to have low pulse oximetry at home his chest x-ray revealed pneumonia likely related to aspiration.  His urinalysis was likely positive for UTI.  He has subsequent sepsis.  Upon physical examination:  Generally: Ill-looking elderly Caucasian male in mild respiratory distress with conversational dyspnea Cardiovascular: Regular rate and rhythm with normal S1-S2 and no murmurs gallops or rubs. Respiratory: Clear to auscultation bilaterally Abdomen: Soft, nontender, nondistended with positive bowel sounds and no palpable organomegaly or masses. Extremities: No edema clubbing or cyanosis  Labs and radiographic studies: were all reviewed  Assessment/plan: He will be admitted to a medically monitored bed for further management of his sepsis likely secondary to aspiration pneumonia and possible UTI.  He will be placed on IV vancomycin and cefepime and Flagyl.  We will follow his blood, sputum and urine cultures.  For further details please refer to dictated admission H&P.  I have discussed the case with my nurse practitioner. I agree with the admission note and the rest of the plan of care as delineated by Ms. Gardiner Barefoot, CRNP.

## 2018-10-24 NOTE — Progress Notes (Addendum)
Saw the patient bedside.  The patient has no complaints. Vital signs reviewed, physical examination is done. Continue current treatment for sepsis with UTI.  Repeated chest x-ray did not show any aspiration pneumonia.  Change to biotics to cefepime.  Follow-up blood culture and urine culture.  I discussed the patient, his wife, RN, and case Freight forwarder. Time spent about 18 minutes.

## 2018-10-24 NOTE — Progress Notes (Signed)
Advanced Care Plan.  Purpose of Encounter: CODE STATUS. Parties in Attendance: The patient, RN and me. Patient's Decisional Capacity: Yes. Medical Story: Howard Harmon  is a 85 y.o. male with a known history of atrial fibrillation, CKD, hypertension, melanoma, and history of DVT.  The patient is admitted for sepsis due to UTI.  I discussed with the patient about his current condition, prognosis and CODE STATUS.  The patient does want to be resuscitated and intubated if he has cardiopulmonary arrest.  This is confirmed by RN. Plan:  Code Status: Full code. Time spent discussing advance care planning:18 minutes.

## 2018-10-24 NOTE — ED Notes (Signed)
Pt's O2 sats noted to be in the low 80's to 91-92% range when enetring VS from the bedside, despite being on 2L O2 via [place]. Pt was instructed on deep breathing exercises to improve O2 sats without success. Pt's O2 increased to 4L via  at this time with noted improvement to 95%.  Dr Beather Arbour made aware.

## 2018-10-24 NOTE — ED Notes (Signed)
Pt placed on 2L of O2. 

## 2018-10-24 NOTE — H&P (Signed)
[place] at [place] NAME: Howard Harmon    MR#:  [id number]  DATE OF BIRTH:  [birth year]  DATE OF ADMISSION:  10/23/2018  PRIMARY CARE PHYSICIAN: Rusty Aus, MD   REQUESTING/REFERRING PHYSICIAN: Harvest Dark, MD  CHIEF COMPLAINT:   Chief Complaint  Patient presents with  . Fever  . Emesis    HISTORY OF PRESENT ILLNESS:  Howard Harmon  is a 85 y.o. male with a known history of atrial fibrillation, CKD, hypertension, and history of DVT.  He presented to the emergency room complaining of new onset fever with chills as well as nausea and vomiting.  Symptoms began after completing dinner on 10/23/2018.  Patient reports having lower abdominal pain followed by a normal bowel movement.  He then developed nausea and vomiting x1.  Patient's daughter recorded his temperature to be 101 at home.  Patient reports he has experienced a nonproductive cough.  He reports shortness of breath with exertion such as ambulation from room to room in his home.  He denies diarrhea.  He denies continued abdominal pain or discomfort.  He denies chest pain.  On arrival to the emergency room oxygen saturation is 84% on room air.  This increased to 96 to 99% on oxygen at 2 L per nasal cannula.  Temperature upon arrival was 101.9.  Lactic acid was 3.4 on arrival as well.  Urinalysis demonstrated 21-50 WBCs with mucus.  Rapid COVID-19 testing is negative.  Chest x-ray demonstrates no abnormalities.  Leukocytosis present with WBC 19.1.  Patient has a history of CKD with BUN 32 and creatinine 1.65 on arrival.  We have admitted him to the hospitalist service for sepsis with possible aspiration pneumonia.  We will continue management.  PAST MEDICAL HISTORY:   Past Medical History:  Diagnosis Date  . Arthritis   . Atrial fibrillation ([place])   . Blood clotting tendency ([place])   . Chronic kidney disease   . DVT (deep venous thrombosis) ([place])   . Hypertension   . Melanoma  ([place])   . Pacemaker     PAST SURGICAL HISTORY:   Past Surgical History:  Procedure Laterality Date  . CHOLECYSTECTOMY    . COLONOSCOPY WITH PROPOFOL N/A 10/15/2014   Procedure: COLONOSCOPY WITH PROPOFOL;  Surgeon: Josefine Class, MD;  Location[facility] ENDOSCOPY;  Service: Endoscopy;  Laterality: N/A;  . COLONOSCOPY WITH PROPOFOL N/A 10/23/2017   Procedure: COLONOSCOPY WITH PROPOFOL;  Surgeon: Lucilla Lame, MD;  Location: [facility] ENDOSCOPY;  Service: Endoscopy;  Laterality: N/A;  . ESOPHAGOGASTRODUODENOSCOPY N/A 10/24/2017   Procedure: ESOPHAGOGASTRODUODENOSCOPY (EGD);  Surgeon: Virgel Manifold, MD;  Location: [facility] ENDOSCOPY;  Service: Endoscopy;  Laterality: N/A;  . INSERT / REPLACE / REMOVE PACEMAKER      SOCIAL HISTORY:   Social History   Tobacco Use  . Smoking status: Never Smoker  . Smokeless tobacco: Never Used  Substance Use Topics  . Alcohol use: No    FAMILY HISTORY:   Family History  Problem Relation Age of Onset  . CAD Father   . Prostate cancer Brother   . Breast cancer Sister     DRUG ALLERGIES:   Allergies  Allergen Reactions  . Warfarin Other (See Comments)    Bleeding diff to stop    REVIEW OF SYSTEMS:   Review of Systems  Constitutional: Positive for chills, fever and malaise/fatigue.  HENT: Positive for congestion. Negative for sinus pain and sore throat.   Eyes: Negative for blurred vision  and double vision.  Respiratory: Positive for cough and shortness of breath. Negative for hemoptysis, sputum production and wheezing.   Cardiovascular: Negative for chest pain, palpitations and leg swelling.  Gastrointestinal: Positive for abdominal pain, diarrhea, nausea and vomiting. Negative for blood in stool, constipation, heartburn and melena.  Genitourinary: Negative for dysuria, flank pain and hematuria.  Musculoskeletal: Negative for falls.  Skin: Negative for itching and rash.  Neurological: Negative for dizziness, focal weakness, loss of  consciousness and headaches.  Psychiatric/Behavioral: Negative.      MEDICATIONS AT HOME:   Prior to Admission medications   Medication Sig Start Date End Date Taking? Authorizing Provider  aspirin 81 MG chewable tablet Chew 81 mg by mouth daily.   Yes [provider]  Cholecalciferol (VITAMIN D3 PO) Take 1 tablet by mouth daily.   Yes [provider]  furosemide (LASIX) 20 MG tablet Take 20 mg by mouth daily as needed.  04/25/17  Yes [provider]  RABEprazole (ACIPHEX) 20 MG tablet Take 20 mg by mouth daily.    Yes [provider]  solifenacin (VESICARE) 5 MG tablet Take 1 tablet by mouth daily. 07/04/18  Yes [provider]  tetrahydrozoline-zinc (VISINE-AC) 0.05-0.25 % ophthalmic solution Place 1 drop into both eyes 3 (three) times daily as needed.   Yes [provider]  traMADol (ULTRAM) 50 MG tablet Take 100 mg by mouth 2 (two) times daily.    Yes [provider]  vitamin B-12 (CYANOCOBALAMIN) 1000 MCG tablet Take 1,000 mcg by mouth daily.   Yes [provider]  docusate sodium (COLACE) 100 MG capsule Take 100-200 mg by mouth daily.    [provider]  LACTASE ENZYME PO Take 1 tablet by mouth as needed.    [provider]  MISC NATURAL PRODUCTS PO Take 1 tablet by mouth daily.    [provider]  OMEGA-3 FATTY ACIDS PO Take 1-2 capsules by mouth 2 (two) times daily.     [provider]  SENNA LEAVES PO Take 470 mg by mouth daily.    [provider]      VITAL SIGNS:  Blood pressure 129/85, pulse 89, temperature (!) 97.2 F (36.2 C), temperature source Oral, resp. rate 18, height 5\' 11"  (1.803 m), weight 81.6 kg, SpO2 96 %.  PHYSICAL EXAMINATION:  Physical Exam Vitals signs and nursing note reviewed.  Constitutional:      General: He is not in acute distress.    Appearance: Normal appearance.  HENT:     Head: Normocephalic and atraumatic.     Right Ear:  External ear normal.     Left Ear: External ear normal.     Nose: Nose normal.     Mouth/Throat:     Mouth: Mucous membranes are moist.     Pharynx: Oropharynx is clear.  Eyes:     General: No scleral icterus.    Extraocular Movements: Extraocular movements intact.     Conjunctiva/sclera: Conjunctivae normal.     Pupils: Pupils are equal, round, and reactive to light.  Neck:     Musculoskeletal: Normal range of motion and neck supple.  Cardiovascular:     Rate and Rhythm: Normal rate and regular rhythm.     Pulses: Normal pulses.     Heart sounds: Normal heart sounds. No murmur. No friction rub. No gallop.   Pulmonary:     Effort: Pulmonary effort is normal.     Breath sounds: Normal breath sounds. No wheezing, rhonchi or  rales.  Abdominal:     General: Bowel sounds are normal. There is no distension.     Palpations: Abdomen is soft.     Tenderness: There is no abdominal tenderness.     Hernia: No hernia is present.  Musculoskeletal: Normal range of motion.        General: No swelling or tenderness.     Right lower leg: No edema.     Left lower leg: No edema.  Skin:    General: Skin is warm and dry.     Capillary Refill: Capillary refill takes less than 2 seconds.     Coloration: Skin is not jaundiced.     Findings: No rash.  Neurological:     General: No focal deficit present.     Mental Status: He is alert and oriented to person, place, and time.     Cranial Nerves: No cranial nerve deficit.     Sensory: No sensory deficit.     Motor: No weakness.  Psychiatric:        Mood and Affect: Mood normal.        Behavior: Behavior normal.      LABORATORY PANEL:   CBC Recent Labs  Lab 10/24/18 0332  WBC 19.1*  HGB 10.2*  HCT 31.5*  PLT 122*   ------------------------------------------------------------------------------------------------------------------  Chemistries  Recent Labs  Lab 10/23/18 0002 10/24/18 0332  NA 140  --   K 4.4  --   CL 107  --   CO2  23  --   GLUCOSE 100*  --   BUN 32*  --   CREATININE 1.70* 1.65*  CALCIUM 9.0  --   AST 22  --   ALT 15  --   ALKPHOS 61  --   BILITOT 0.9  --    ------------------------------------------------------------------------------------------------------------------  Cardiac Enzymes No results for input(s): TROPONINI in the last 168 hours. ------------------------------------------------------------------------------------------------------------------  RADIOLOGY:  Dg Chest Port 1 View  Result Date: 10/24/2018 CLINICAL DATA:  85 year old male with fever and vomiting. EXAM: PORTABLE CHEST 1 VIEW COMPARISON:  CTA chest 03/27/2018 and earlier. FINDINGS: Portable AP upright view at 0001 hours. Stable lung volumes and mediastinal contours. Mild cardiomegaly. Visualized tracheal air column is within normal limits. Stable left chest cardiac pacemaker. Allowing for portable technique the lungs are clear. No pneumothorax. Advanced degenerative changes at both shoulders. Negative visible bowel gas pattern. IMPRESSION: No acute cardiopulmonary abnormality. Electronically Signed   By: Genevie Ann M.D.   On: 10/24/2018 00:24      IMPRESSION AND PLAN:   1.  Sepsis - Patient started on broad-spectrum IV antibiotics with cefepime, vancomycin, and Flagyl. --Repeat lactic acid per sepsis protocol - Patient received IV fluid bolus in the emergency room.  We have continued normal saline at 125 cc/h. --We will repeat CBC in the a.m. - Blood, urine, and sputum cultures are pending, will adjust antibiotic therapy based on results  2.  Urinary tract infection - IV antibiotic therapy initiated as outlined above - Will adjust treatment based on urine culture results when available - Given patient's history of CKD, will monitor renal function closely.  Will repeat BMP in the a.m.  3.  Aspiration pneumonia-rule out - Patient has been started on broad-spectrum IV antibiotic therapy with cefepime, vancomycin, as well  as Flagyl for the possibility of aspiration pneumonia given his history of nausea and vomiting followed by onset of fever. - We will repeat chest x-ray in the a.m. - Patient has O2 at 2 L  per nasal cannula - DuoNebs every 6 hours as needed for shortness of breath and cough  4.  Atrial fibrillation history of - Anticoagulant therapy initiated with Lovenox.  Patient is not currently on anticoagulant per records. - Continuous telemetry  5.  CKD - Patient is receiving normal saline at 125 cc/h after IV fluid bolus received in the emergency room on admission.  We will continue to monitor renal function closely with repeated BMP in the a.m.    All the records are reviewed and case discussed with ED provider. The plan of care was discussed in details with the patient (and family). I answered all questions. The patient agreed to proceed with the above mentioned plan. Further management will depend upon hospital course.   CODE STATUS: Full code  TOTAL TIME TAKING CARE OF THIS PATIENT:45 minutes.    [place] on 10/24/2018 at 5:33 AM  Pager - [phone]  After 6pm go to [email or web address] - Proofreader  [facility]  Office  [phone]  CC: Primary care physician; Rusty Aus, MD   Note: This dictation was prepared with Dragon dictation along with smaller phrase technology. Any transcriptional errors that result from this process are unintentional.

## 2018-10-25 LAB — CBC
HCT: 29.9 % — ABNORMAL LOW (ref 39.0–52.0)
Hemoglobin: 9.5 g/dL — ABNORMAL LOW (ref 13.0–17.0)
MCH: 33.1 pg (ref 26.0–34.0)
MCHC: 31.8 g/dL (ref 30.0–36.0)
MCV: 104.2 fL — ABNORMAL HIGH (ref 80.0–100.0)
Platelets: 98 10*3/uL — ABNORMAL LOW (ref 150–400)
RBC: 2.87 MIL/uL — ABNORMAL LOW (ref 4.22–5.81)
RDW: 14.8 % (ref 11.5–15.5)
WBC: 9.8 10*3/uL (ref 4.0–10.5)
nRBC: 0 % (ref 0.0–0.2)

## 2018-10-25 LAB — BASIC METABOLIC PANEL
Anion gap: 6 (ref 5–15)
BUN: 31 mg/dL — ABNORMAL HIGH (ref 8–23)
CO2: 20 mmol/L — ABNORMAL LOW (ref 22–32)
Calcium: 8.1 mg/dL — ABNORMAL LOW (ref 8.9–10.3)
Chloride: 114 mmol/L — ABNORMAL HIGH (ref 98–111)
Creatinine, Ser: 1.76 mg/dL — ABNORMAL HIGH (ref 0.61–1.24)
GFR calc Af Amer: 40 mL/min — ABNORMAL LOW (ref >60)
GFR calc non Af Amer: 34 mL/min — ABNORMAL LOW (ref >60)
Glucose, Bld: 87 mg/dL (ref 70–99)
Potassium: 4.4 mmol/L (ref 3.5–5.1)
Sodium: 140 mmol/L (ref 135–145)

## 2018-10-25 MED ORDER — SODIUM CHLORIDE 0.9 % IV SOLN
2.0000 g | Freq: Four times a day (QID) | INTRAVENOUS | Status: DC
  Administered 2018-10-25 – 2018-10-26 (×3): 2 g via INTRAVENOUS
  Filled 2018-10-25: qty 2
  Filled 2018-10-25: qty 2000
  Filled 2018-10-25: qty 2
  Filled 2018-10-25 (×2): qty 2000
  Filled 2018-10-25: qty 2
  Filled 2018-10-25: qty 2000

## 2018-10-25 NOTE — Progress Notes (Signed)
Physical Therapy Treatment Patient Details Name: Howard Harmon MRN: [id number] DOB: [birth year] Today's Date: 10/25/2018    History of Present Illness 85 y/o male here with abdominal pain, nausea/vomiting, fever and hypoxia; admitted with sepsis related to PNA.  COVID-19 negative at admission.    PT Comments    Pt did well with PT session showing increased ambulation distance/tolerance, but more importantly was more controlled and safe with the effort. He still displayed forward lean, but was able to maintain better position relative to the walker and rightfully protests that his rollator is easier to handle, especially on turns.  Pt adamant that he has enough assist (daughters help BID) and never needs to walk more than the limited distance that he managed today.  Pt still with some safety concerns and PT f/u (HHPT) is still recommended on discharge.     Follow Up Recommendations  Home health PT(pt refuses STR, more safe with ambulation this date)     Equipment Recommendations       Recommendations for Other Services       Precautions / Restrictions Precautions Precautions: Fall Restrictions Weight Bearing Restrictions: No    Mobility  Bed Mobility               General bed mobility comments: in recliner on arrival, able to lift/scoot hips   Transfers Overall transfer level: Modified independent Equipment used: Rolling walker (2 wheeled) Transfers: Sit to/from Stand Sit to Stand: Min guard         General transfer comment: Pt with good hand placement, able to rise to standing w/o direct assist and maintain balance relatively well  Ambulation/Gait Ambulation/Gait assistance: Min assist;Min guard Gait Distance (Feet): 100 Feet Assistive device: Rolling walker (2 wheeled)       General Gait Details: Pt continues to have forward trunk lean and some difficulty with maintaining appropriate walker positoning (especially with turns) but was much more controlled  and safer than yesterday with no LOBs or stagger stepping.  Pt with O2 remaining in the 90s t/o the effort, HR variable from 80s to 110s.     Stairs             Wheelchair Mobility    Modified Rankin (Stroke Patients Only)       Balance Overall balance assessment: Needs assistance Sitting-balance support: No upper extremity supported;Feet supported Sitting balance-Leahy Scale: Good     Standing balance support: Bilateral upper extremity supported Standing balance-Leahy Scale: Fair Standing balance comment: Pt reliant on walker in standing, but able to maintain balance more appropriately and consistently this session                            Cognition Arousal/Alertness: Awake/alert Behavior During Therapy: WFL for tasks assessed/performed Overall Cognitive Status: Within Functional Limits for tasks assessed                                        Exercises General Exercises - Lower Extremity Ankle Circles/Pumps: AROM;10 reps Long Arc Quad: Strengthening;10 reps Heel Slides: Strengthening;10 reps Hip ABduction/ADduction: Strengthening;10 reps Hip Flexion/Marching: Strengthening;10 reps    General Comments        Pertinent Vitals/Pain Pain Assessment: (chronic R>L knee pain)    Home Living  Prior Function            PT Goals (current goals can now be found in the care plan section) Progress towards PT goals: Progressing toward goals    Frequency    Min 2X/week      PT Plan Current plan remains appropriate    Co-evaluation              AM-PAC PT "6 Clicks" Mobility   Outcome Measure  Help needed turning from your back to your side while in a flat bed without using bedrails?: A Little Help needed moving from lying on your back to sitting on the side of a flat bed without using bedrails?: A Little Help needed moving to and from a bed to a chair (including a wheelchair)?: A  Little Help needed standing up from a chair using your arms (e.g., wheelchair or bedside chair)?: A Little Help needed to walk in hospital room?: A Little Help needed climbing 3-5 steps with a railing? : A Lot 6 Click Score: 17    End of Session Equipment Utilized During Treatment: Gait belt Activity Tolerance: Patient tolerated treatment well;Patient limited by fatigue Patient left: in chair;with call bell/phone within reach;with chair alarm set Nurse Communication: Mobility status PT Visit Diagnosis: Muscle weakness (generalized) (M62.81);Difficulty in walking, not elsewhere classified (R26.2)     Time: 3662-9476 PT Time Calculation (min) (ACUTE ONLY): 32 min  Charges:  $Gait Training: 8-22 mins $Therapeutic Exercise: 8-22 mins                     Kreg Shropshire, DPT 10/25/2018, 5:31 PM

## 2018-10-25 NOTE — Progress Notes (Signed)
[place] at [place] NAME: Howard Harmon    MR#:  [id number]  DATE OF BIRTH:  11-26-1929  SUBJECTIVE:  CHIEF COMPLAINT:   Chief Complaint  Patient presents with  . Fever  . Emesis   Patient has no complaints, he is very hard hearing. REVIEW OF SYSTEMS:  Review of Systems  Constitutional: Negative for chills, fever and malaise/fatigue.  HENT: Positive for hearing loss. Negative for sore throat.   Eyes: Negative for blurred vision and double vision.  Respiratory: Negative for cough, hemoptysis, shortness of breath, wheezing and stridor.   Cardiovascular: Negative for chest pain, palpitations, orthopnea and leg swelling.  Gastrointestinal: Negative for abdominal pain, blood in stool, diarrhea, melena, nausea and vomiting.  Genitourinary: Negative for dysuria, flank pain and hematuria.  Musculoskeletal: Negative for back pain and joint pain.  Neurological: Negative for dizziness, sensory change, focal weakness, seizures, loss of consciousness, weakness and headaches.  Endo/Heme/Allergies: Negative for polydipsia.  Psychiatric/Behavioral: Negative for depression. The patient is not nervous/anxious.     DRUG ALLERGIES:   Allergies  Allergen Reactions  . Warfarin Other (See Comments)    Bleeding diff to stop   VITALS:  Blood pressure 120/72, pulse 72, temperature 98.5 F (36.9 C), temperature source Oral, resp. rate 18, height 5\' 11"  (1.803 m), weight 81.6 kg, SpO2 99 %. PHYSICAL EXAMINATION:  Physical Exam Constitutional:      General: He is not in acute distress.    Appearance: Normal appearance.  HENT:     Head: Normocephalic.     Mouth/Throat:     Mouth: Mucous membranes are moist.  Eyes:     General: No scleral icterus.    Conjunctiva/sclera: Conjunctivae normal.     Pupils: Pupils are equal, round, and reactive to light.  Neck:     Musculoskeletal: Normal range of motion and neck supple.     Vascular: No JVD.   Trachea: No tracheal deviation.  Cardiovascular:     Rate and Rhythm: Normal rate and regular rhythm.     Heart sounds: Normal heart sounds. No murmur. No gallop.   Pulmonary:     Effort: Pulmonary effort is normal. No respiratory distress.     Breath sounds: Normal breath sounds. No wheezing or rales.  Abdominal:     General: Bowel sounds are normal. There is no distension.     Palpations: Abdomen is soft.     Tenderness: There is no abdominal tenderness. There is no rebound.  Musculoskeletal: Normal range of motion.        General: No tenderness.     Right lower leg: No edema.     Left lower leg: No edema.  Skin:    Findings: No erythema or rash.  Neurological:     General: No focal deficit present.     Mental Status: He is alert and oriented to person, place, and time.     Cranial Nerves: No cranial nerve deficit.  Psychiatric:        Mood and Affect: Mood normal.    LABORATORY PANEL:  Male CBC Recent Labs  Lab 10/25/18 0629  WBC 9.8  HGB 9.5*  HCT 29.9*  PLT 98*   ------------------------------------------------------------------------------------------------------------------ Chemistries  Recent Labs  Lab 10/23/18 0002  10/25/18 0629  NA 140  --  140  K 4.4  --  4.4  CL 107  --  114*  CO2 23  --  20*  GLUCOSE 100*  --  87  BUN 32*  --  31*  CREATININE 1.70*   < > 1.76*  CALCIUM 9.0  --  8.1*  AST 22  --   --   ALT 15  --   --   ALKPHOS 61  --   --   BILITOT 0.9  --   --    < > = values in this interval not displayed.   RADIOLOGY:  No results found. ASSESSMENT AND PLAN:   1.  Sepsis due to UTI - Patient was on broad-spectrum IV antibiotics with cefepime, vancomycin, and Flagyl. Culture report >=100,000 COLONIES/mL ENTEROCOCCUS FAECALIS.  Leukocytosis improved. Change to ampicillin IV.  Follow-up blood culture.  2.  Aspiration pneumonia-ruled out - Patient has O2 at 2 L per nasal cannula at night. - DuoNebs every 6 hours as needed for shortness  of breath and cough  4.  History of chronic Atrial fibrillation, rate controlled.  Patient is not currently on anticoagulant per records.  5.  ARF on CKD stage III. Continue IV fluid support and follow-up BMP.  Lactic acidosis.  Improved with treatment as above.  Generalized weakness.  PT evaluation suggest skilled nursing facility placement. I discussed with the patient and his daughter, the preferred to go home with home health.   All the records are reviewed and case discussed with Care Management/Social Worker. Management plans discussed with the patient, his daughter and they are in agreement.  CODE STATUS: Full Code  TOTAL TIME TAKING CARE OF THIS PATIENT: 32 minutes.   More than 50% of the time was spent in counseling/coordination of care: YES  POSSIBLE D/C IN 2 DAYS, DEPENDING ON CLINICAL CONDITION.   Demetrios Loll M.D on 10/25/2018 at 12:57 PM  Between 7am to 6pm - Pager - [phone]  After 6pm go to [email or web address] - [place] [facility]

## 2018-10-26 LAB — BASIC METABOLIC PANEL
Anion gap: 8 (ref 5–15)
BUN: 27 mg/dL — ABNORMAL HIGH (ref 8–23)
CO2: 22 mmol/L (ref 22–32)
Calcium: 9 mg/dL (ref 8.9–10.3)
Chloride: 112 mmol/L — ABNORMAL HIGH (ref 98–111)
Creatinine, Ser: 1.56 mg/dL — ABNORMAL HIGH (ref 0.61–1.24)
GFR calc Af Amer: 46 mL/min — ABNORMAL LOW (ref >60)
GFR calc non Af Amer: 40 mL/min — ABNORMAL LOW (ref >60)
Glucose, Bld: 102 mg/dL — ABNORMAL HIGH (ref 70–99)
Potassium: 4.4 mmol/L (ref 3.5–5.1)
Sodium: 142 mmol/L (ref 135–145)

## 2018-10-26 MED ORDER — AMOXICILLIN 500 MG PO CAPS: 500.0000 mg | ORAL_CAPSULE | Freq: Three times a day (TID) | ORAL | 0 refills | Status: DC

## 2018-10-26 MED ORDER — AMOXICILLIN 500 MG PO CAPS: 500.0000 mg | ORAL_CAPSULE | Freq: Three times a day (TID) | ORAL | Status: DC

## 2018-10-26 MED ORDER — AMOXICILLIN-POT CLAVULANATE 875-125 MG PO TABS: 1.0000 | ORAL_TABLET | Freq: Two times a day (BID) | ORAL | 0 refills | Status: AC

## 2018-10-26 MED ORDER — TRAMADOL HCL 50 MG PO TABS: 50.0000 mg | ORAL_TABLET | Freq: Four times a day (QID) | ORAL | 0 refills | Status: DC | PRN

## 2018-10-26 MED ORDER — AMOXICILLIN-POT CLAVULANATE 875-125 MG PO TABS
1.0000 | ORAL_TABLET | Freq: Two times a day (BID) | ORAL | Status: DC
  Administered 2018-10-26: 12:00:00 1 via ORAL
  Filled 2018-10-26: qty 1

## 2018-10-26 MED ORDER — AMOXICILLIN-POT CLAVULANATE 875-125 MG PO TABS: 1.0000 | ORAL_TABLET | Freq: Two times a day (BID) | ORAL | 0 refills | Status: DC

## 2018-10-26 NOTE — Discharge Instructions (Signed)
HHPT °

## 2018-10-26 NOTE — TOC Transition Note (Signed)
Transition of Care [facility]) - CM/SW Discharge Note   Patient Details  Name: Howard Harmon MRN: [id number] Date of Birth: [birth year]  [facility] CM/SW Contact:  Latanya Maudlin, RN Phone Number: 10/26/2018, 10:51 AM   Clinical Narrative:  Patient to be discharged per MD order. Orders in place for home health services. Patient continues to refuse home health. The importance of physical therapy has been stressed to the patient by myself, MD, and PT. Patient alert and oriented and capable of making medical decisions. No further RNCM needs.      Final next level of care: Home/Self Care Barriers to Discharge: No Barriers Identified   Patient Goals and CMS Choice   CMS Medicare.gov Compare Post Acute Care list provided to:: Patient Choice offered to / list presented to : Patient  Discharge Placement                       Discharge Plan and Services   Discharge Planning Services: CM Consult Post Acute Care Choice: Home Health                    HH Arranged: Patient Refused [facility]          Social Determinants of Health ([place]) Interventions     Readmission Risk Interventions Readmission Risk Prevention Plan 10/26/2018  Transportation Screening Complete  PCP or Specialist Appt within 5-7 Days Complete  Home Care Screening Complete  Medication Review (RN CM) Complete  Some recent data might be hidden

## 2018-10-26 NOTE — Progress Notes (Signed)
Discharge instructions and med details reviewed with patient Pt verbalizes understanding. Printed AVS and printed prescription given to patient. IV removed. Pt escorted out via wheelchair

## 2018-10-26 NOTE — Discharge Summary (Signed)
[facility] NAME: Howard Harmon    MR#:  [id number]  DATE OF BIRTH:  27-Apr-1930  DATE OF ADMISSION:  10/23/2018   ADMITTING PHYSICIAN: Christel Mormon, MD  DATE OF DISCHARGE: 10/26/2018  PRIMARY CARE PHYSICIAN: Rusty Aus, MD   ADMISSION DIAGNOSIS:  Fever, unspecified fever cause [R50.9] Aspiration pneumonia, unspecified aspiration pneumonia type, unspecified laterality, unspecified part of lung ([place]) [J69.0] Community acquired pneumonia, unspecified laterality [J18.9] DISCHARGE DIAGNOSIS:  Active Problems:   Sepsis ([place])  SECONDARY DIAGNOSIS:   Past Medical History:  Diagnosis Date   Arthritis    Atrial fibrillation ([place])    Blood clotting tendency ([place])    Chronic kidney disease    DVT (deep venous thrombosis) ([place])    Hypertension    Melanoma ([place])    [place]:  1.Sepsis due to UTI - Patient was on broad-spectrum IV antibiotics with cefepime, vancomycin, and Flagyl. Culture report >=100,000 COLONIES/mL ENTEROCOCCUS FAECALIS.  Leukocytosis improved. Changed to ampicillin IV.  Follow-up blood culture negative so far. Change to Augmentin p.o. twice daily for 7 more days.  2. Aspiration pneumonia-ruled out - Patient has O2 at 2 L per nasal cannula at night. - DuoNebs every 6 hours as needed for shortness of breath and cough  4.  History of chronicAtrial fibrillation, rate controlled. Patient is not currently on anticoagulant per records.  5.  ARF onCKD stage III. Improving with IV fluid support and follow-up BMP with PCP.  Lactic acidosis.  Improved with treatment as above.  Generalized weakness.  PT evaluation suggest HHPT. I discussed with the patient and his daughter, tjey want discharge to home with home health. DISCHARGE CONDITIONS:  Stable, discharge to home with home health today. CONSULTS OBTAINED:   DRUG ALLERGIES:   Allergies  Allergen Reactions   Warfarin  Other (See Comments)    Bleeding diff to stop   DISCHARGE MEDICATIONS:   Allergies as of 10/26/2018      Reactions   Warfarin Other (See Comments)   Bleeding diff to stop      Medication List    TAKE these medications   acetaminophen 500 MG tablet Commonly known as:  TYLENOL Take 500 mg by mouth at bedtime as needed for moderate pain.   amoxicillin-clavulanate 875-125 MG tablet Commonly known as:  AUGMENTIN Take 1 tablet by mouth every 12 (twelve) hours for 7 days.   aspirin 81 MG chewable tablet Chew 81 mg by mouth daily.   docusate sodium 100 MG capsule Commonly known as:  COLACE Take 100-200 mg by mouth 2 (two) times daily.   furosemide 20 MG tablet Commonly known as:  LASIX Take 20 mg by mouth daily as needed for fluid.   LACTASE ENZYME PO Take 1 tablet by mouth as needed (for indigestion).   MISC NATURAL PRODUCTS PO Take 1 tablet by mouth daily.   omega-3 acid ethyl esters 1 g capsule Commonly known as:  LOVAZA Take 1-2 g by mouth 2 (two) times daily.   RABEprazole 20 MG tablet Commonly known as:  ACIPHEX Take 20 mg by mouth daily.   SENNA LEAVES PO Take 1 capsule by mouth 2 (two) times a day.   tetrahydrozoline-zinc 0.05-0.25 % ophthalmic solution Commonly known as:  VISINE-AC Place 1 drop into both eyes 3 (three) times daily as needed.   traMADol 50 MG tablet Commonly known as:  ULTRAM Take 1 tablet (50 mg total) by mouth every  6 (six) hours as needed for moderate pain. What changed:    how much to take  when to take this  reasons to take this Notes to patient:  Last dose given at 10:40am   vitamin B-12 1000 MCG tablet Commonly known as:  CYANOCOBALAMIN Take 1,000 mcg by mouth daily.   VITAMIN D3 PO Take 1 tablet by mouth daily.        DISCHARGE INSTRUCTIONS:  See AVS.  If you experience worsening of your admission symptoms, develop shortness of breath, life threatening emergency, suicidal or homicidal thoughts you must seek  medical attention immediately by calling 911 or calling your MD immediately  if symptoms less severe.  You Must read complete instructions/literature along with all the possible adverse reactions/side effects for all the Medicines you take and that have been prescribed to you. Take any new Medicines after you have completely understood and accpet all the possible adverse reactions/side effects.   Please note  You were cared for by a hospitalist during your hospital stay. If you have any questions about your discharge medications or the care you received while you were in the hospital after you are discharged, you can call the unit and asked to speak with the hospitalist on call if the hospitalist that took care of you is not available. Once you are discharged, your primary care physician will handle any further medical issues. Please note that NO REFILLS for any discharge medications will be authorized once you are discharged, as it is imperative that you return to your primary care physician (or establish a relationship with a primary care physician if you do not have one) for your aftercare needs so that they can reassess your need for medications and monitor your lab values.    On the day of Discharge:  VITAL SIGNS:  Blood pressure (!) 156/98, pulse 67, temperature 97.9 F (36.6 C), temperature source Oral, resp. rate 20, height 5\' 11"  (1.803 m), weight 81.6 kg, SpO2 93 %. PHYSICAL EXAMINATION:  GENERAL:  85 y.o.-year-old patient lying in the bed with no acute distress.  EYES: Pupils equal, round, reactive to light and accommodation. No scleral icterus. Extraocular muscles intact.  HEENT: Head atraumatic, normocephalic. Oropharynx and nasopharynx clear.  NECK:  Supple, no jugular venous distention. No thyroid enlargement, no tenderness.  LUNGS: Normal breath sounds bilaterally, no wheezing, rales,rhonchi or crepitation. No use of accessory muscles of respiration.  CARDIOVASCULAR: S1, S2  normal. No murmurs, rubs, or gallops.  ABDOMEN: Soft, non-tender, non-distended. Bowel sounds present. No organomegaly or mass.  EXTREMITIES: No pedal edema, cyanosis, or clubbing.  NEUROLOGIC: Cranial nerves II through XII are intact. Muscle strength 4/5 in all extremities. Sensation intact. Gait not checked.  PSYCHIATRIC: The patient is alert and oriented x 3.  SKIN: No obvious rash, lesion, or ulcer.  DATA REVIEW:   CBC Recent Labs  Lab 10/25/18 0629  WBC 9.8  HGB 9.5*  HCT 29.9*  PLT 98*    Chemistries  Recent Labs  Lab 10/23/18 0002  10/26/18 0536  NA 140   < > 142  K 4.4   < > 4.4  CL 107   < > 112*  CO2 23   < > 22  GLUCOSE 100*   < > 102*  BUN 32*   < > 27*  CREATININE 1.70*   < > 1.56*  CALCIUM 9.0   < > 9.0  AST 22  --   --   ALT 15  --   --  ALKPHOS 61  --   --   BILITOT 0.9  --   --    < > = values in this interval not displayed.     Microbiology Results  Results for orders placed or performed during the hospital encounter of 10/23/18  Blood Culture (routine x 2)     Status: None (Preliminary result)   Collection Time: 10/23/18 12:02 AM  Result Value Ref Range Status   Specimen Description BLOOD BLOOD LEFT WRIST  Final   Special Requests   Final    BOTTLES DRAWN AEROBIC AND ANAEROBIC Blood Culture adequate volume   Culture   Final    NO GROWTH 2 DAYS Performed at [facility], [address] [place], [place] [address]    Report Status PENDING  Incomplete  Blood Culture (routine x 2)     Status: None (Preliminary result)   Collection Time: 10/23/18 12:02 AM  Result Value Ref Range Status   Specimen Description BLOOD RIGHT ANTECUBITAL  Final   Special Requests   Final    BOTTLES DRAWN AEROBIC AND ANAEROBIC Blood Culture results may not be optimal due to an excessive volume of blood received in culture bottles   Culture   Final    NO GROWTH 2 DAYS Performed at [facility], [address], [place], [place] [address]     Report Status PENDING  Incomplete  Urine culture     Status: Abnormal (Preliminary result)   Collection Time: 10/23/18  1:18 AM  Result Value Ref Range Status   Specimen Description   Final    URINE, RANDOM Performed at [facility], [address], [place], [place] [address]    Special Requests   Final    NONE Performed at [facility], [address], [place], [place] [address]    Culture (A)  Final    50,000 COLONIES/mL ENTEROCOCCUS FAECALIS SUSCEPTIBILITIES TO FOLLOW CULTURE REINCUBATED FOR BETTER GROWTH Performed at [facility], [place] [address] [place],  [address]    Report Status PENDING  Incomplete  SARS Coronavirus 2 (CEPHEID- Performed in [place] hospital lab), Hosp Order     Status: None   Collection Time: 10/24/18 12:02 AM  Result Value Ref Range Status   SARS Coronavirus 2 NEGATIVE NEGATIVE Final    Comment: (NOTE) If result is NEGATIVE SARS-CoV-2 target nucleic acids are NOT DETECTED. The SARS-CoV-2 RNA is generally detectable in upper and lower  respiratory specimens during the acute phase of infection. The lowest  concentration of SARS-CoV-2 viral copies this assay can detect is 250  copies / mL. A negative result does not preclude SARS-CoV-2 infection  and should not be used as the sole basis for treatment or other  patient management decisions.  A negative result may occur with  improper specimen collection / handling, submission of specimen other  than nasopharyngeal swab, presence of viral mutation(s) within the  areas targeted by this assay, and inadequate number of viral copies  (<250 copies / mL). A negative result must be combined with clinical  observations, patient history, and epidemiological information. If result is POSITIVE SARS-CoV-2 target nucleic acids are DETECTED. The SARS-CoV-2 RNA is generally detectable in upper and lower  respiratory specimens dur ing the acute phase of infection.  Positive  results  are indicative of active infection with SARS-CoV-2.  Clinical  correlation with patient history and other diagnostic information is  necessary to determine patient infection status.  Positive results do  not  rule out bacterial infection or co-infection with other viruses. If result is PRESUMPTIVE POSTIVE SARS-CoV-2 nucleic acids MAY BE PRESENT.   A presumptive positive result was obtained on the submitted specimen  and confirmed on repeat testing.  While 2019 novel coronavirus  (SARS-CoV-2) nucleic acids may be present in the submitted sample  additional confirmatory testing may be necessary for epidemiological  and / or clinical management purposes  to differentiate between  SARS-CoV-2 and other Sarbecovirus currently known to infect humans.  If clinically indicated additional testing with an alternate test  methodology [phone]) is advised. The SARS-CoV-2 RNA is generally  detectable in upper and lower respiratory sp ecimens during the acute  phase of infection. The expected result is Negative. Fact Sheet for Patients:  StrictlyIdeas.no Fact Sheet for Healthcare Providers: BankingDealers.co.za This test is not yet approved or cleared by the [place] FDA and has been authorized for detection and/or diagnosis of SARS-CoV-2 by FDA under an Emergency Use Authorization (EUA).  This EUA will remain in effect (meaning this test can be used) for the duration of the COVID-19 declaration under Section 564(b)(1) of the Act, 21 U.S.C. section 360bbb-3(b)(1), unless the authorization is terminated or revoked sooner. Performed at [facility], [address], [place], [place] [address]   Urine culture     Status: Abnormal (Preliminary result)   Collection Time: 10/24/18  1:18 AM  Result Value Ref Range Status   Specimen Description   Final    URINE, RANDOM Performed at [facility], [address] [place], [place]  [address]    Special Requests   Final    NONE Performed at [facility], [address] [place], [place] [address]    Culture (A)  Final    >=100,000 COLONIES/mL ENTEROCOCCUS FAECALIS >=100,000 COLONIES/mL KOCURIA ROSEA ORGANISM 1 SUSCEPTIBILITIES TO FOLLOW ORGANISM 2 Standardized susceptibility testing for this organism is not available. Performed at [facility], [place] [address] [place], [place] [address]    Report Status PENDING  Incomplete  MRSA PCR Screening     Status: None   Collection Time: 10/24/18  7:21 AM  Result Value Ref Range Status   MRSA by PCR NEGATIVE NEGATIVE Final    Comment:        The GeneXpert MRSA Assay (FDA approved for NASAL specimens only), is one component of a comprehensive MRSA colonization surveillance program. It is not intended to diagnose MRSA infection nor to guide or monitor treatment for MRSA infections. Performed at [facility], [address] [place], [place] [address]     RADIOLOGY:  No results found.   Management plans discussed with the patient, his daughter and they are in agreement.  CODE STATUS: Full Code   TOTAL TIME TAKING CARE OF THIS PATIENT: 32 minutes.    Demetrios Loll M.D on 10/26/2018 at 12:31 PM  Between 7am to 6pm - Pager - [phone]  After 6pm go to [email or web address] - Proofreader  [facility]  Office  [phone]  CC: Primary care physician; Rusty Aus, MD   Note: This dictation was prepared with Dragon dictation along with smaller phrase technology. Any transcriptional errors that result from this process are unintentional.

## 2018-10-27 LAB — URINE CULTURE
Culture: 50000 — AB
Culture: >=100000 — AB

## 2018-10-29 LAB — CULTURE, BLOOD (ROUTINE X 2)
Culture: NO GROWTH
Culture: NO GROWTH
Special Requests: ADEQUATE

## 2019-03-16 IMAGING — CR DG CHEST 2V
2 series · 2 of 2 positions shown · non-contrast
Comparison: Radiographs December 12, 2016.

CLINICAL DATA: Hypoxia status post colonoscopy.

EXAM:
CHEST - 2 VIEW

[chest lat]
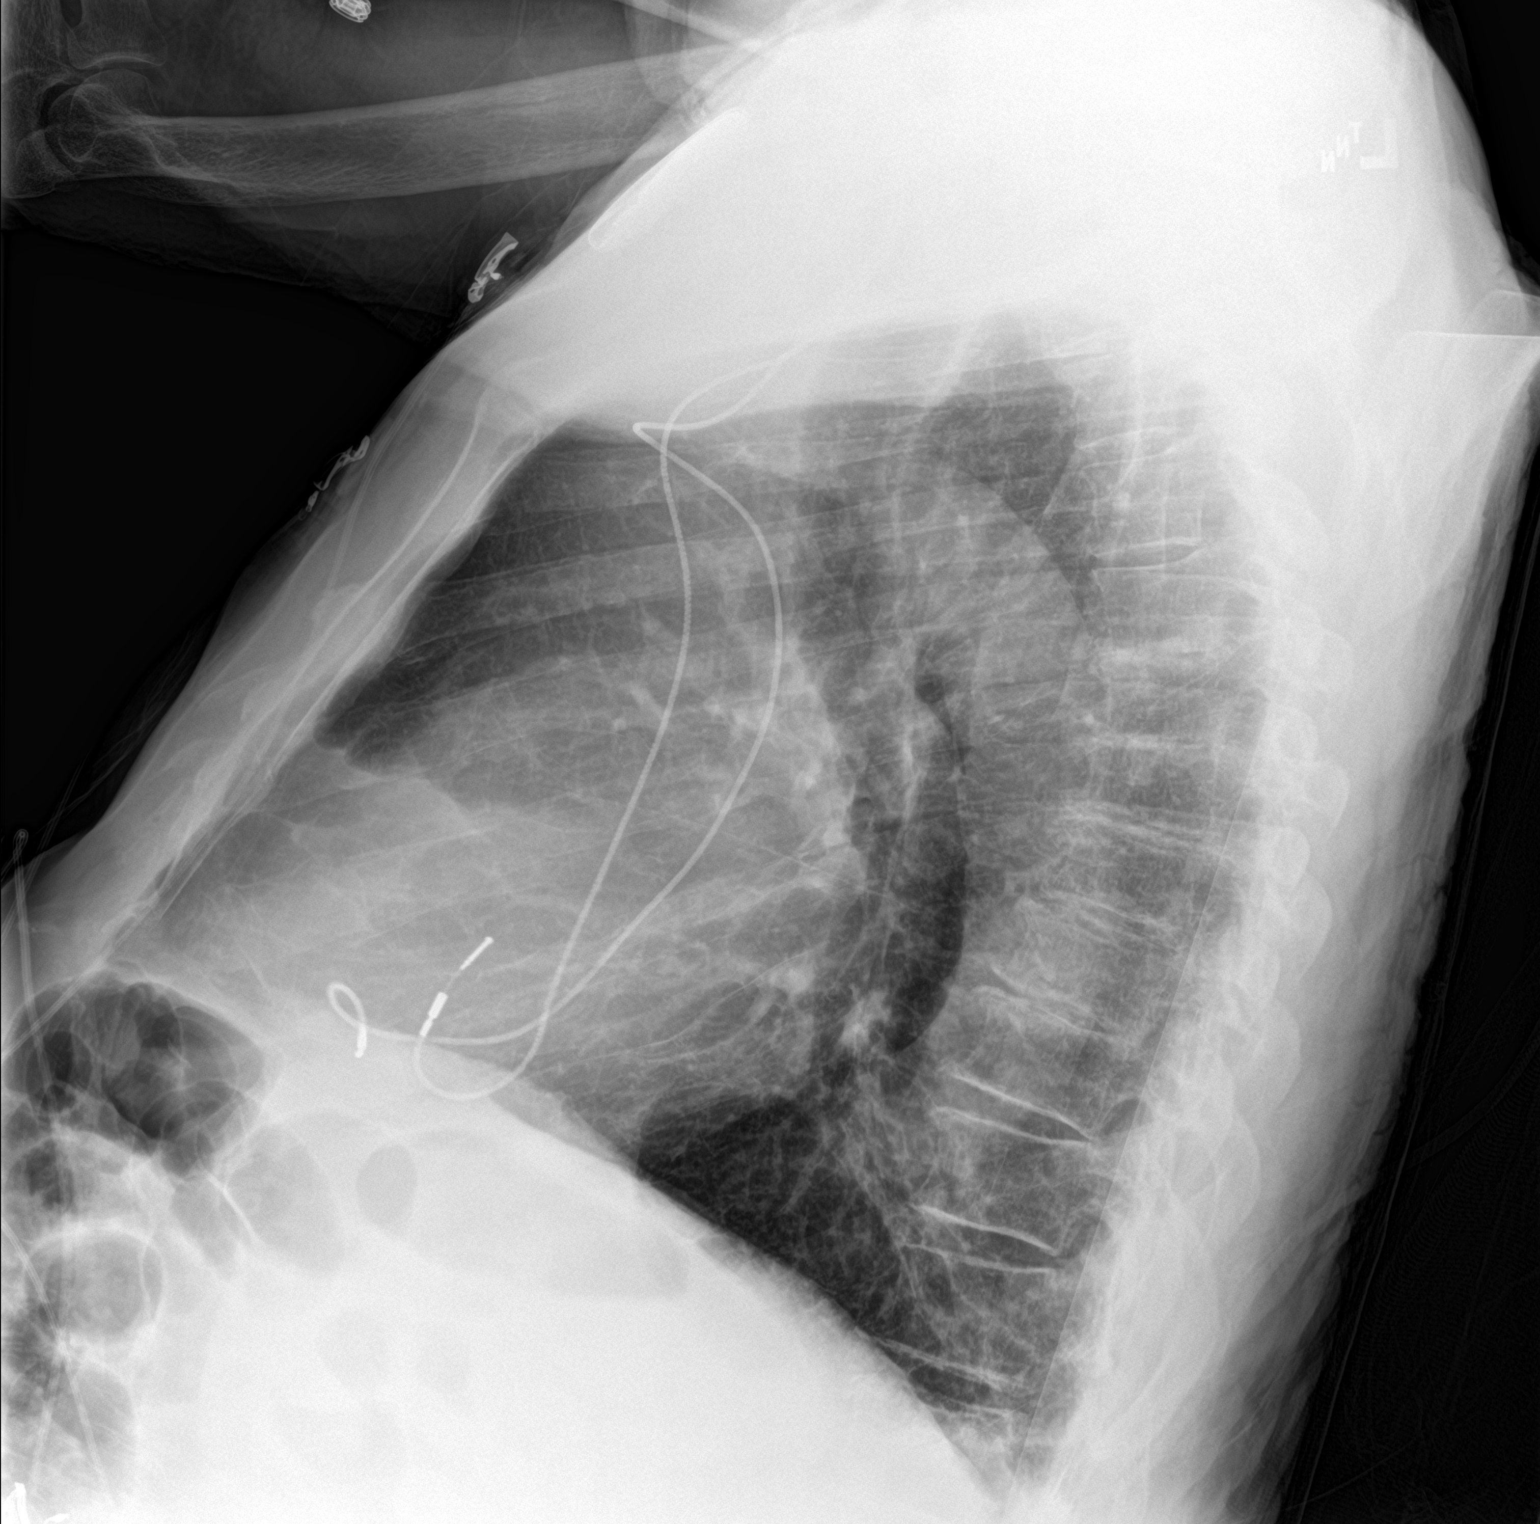

[chest ap]
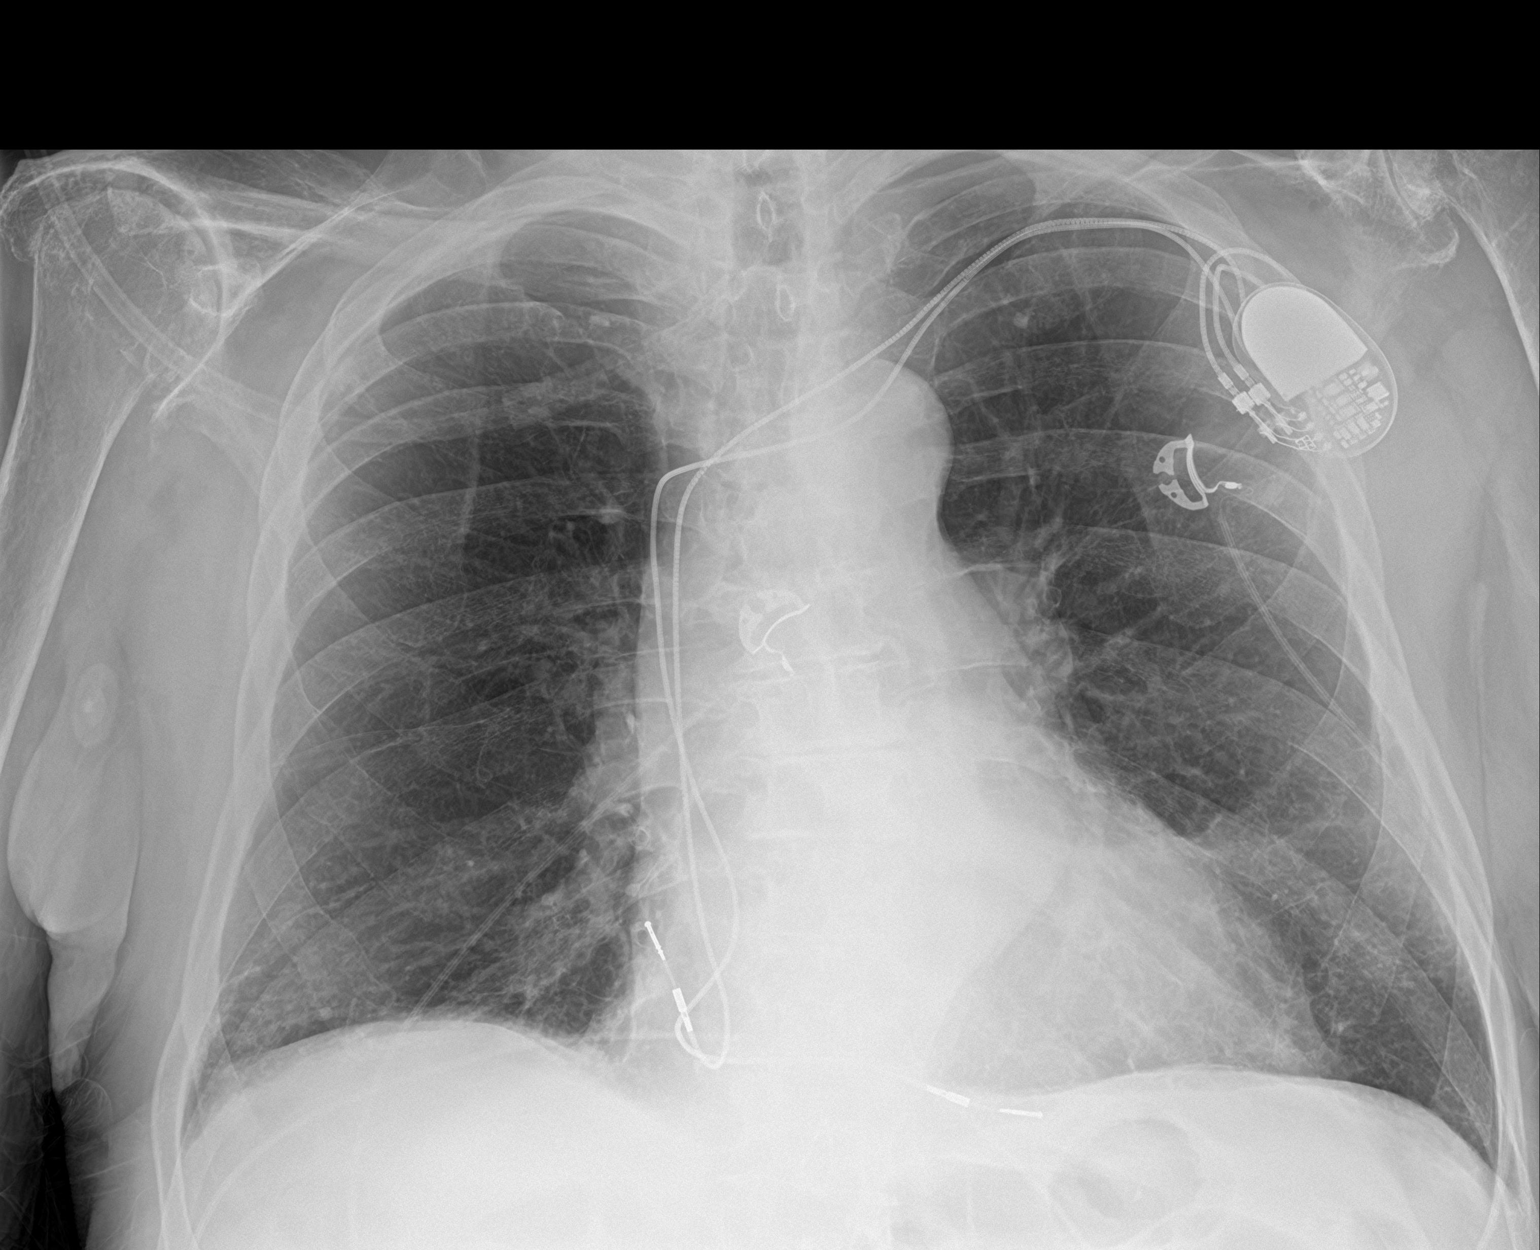

[2 of 2 positions shown; findings below may reference images not displayed]

FINDINGS: Stable cardiomediastinal silhouette. Left-sided pacemaker is
unchanged in position. No pneumothorax or pleural effusion is noted.
No acute pulmonary disease is noted. Degenerative changes are seen
involving both glenohumeral joints.
IMPRESSION: No active cardiopulmonary disease.

## 2020-11-27 ENCOUNTER — Emergency Department: Payer: Medicare Other

## 2020-11-27 ENCOUNTER — Encounter: Payer: Self-pay | Admitting: Emergency Medicine

## 2020-11-27 ENCOUNTER — Emergency Department
Admission: EM | Admit: 2020-11-27 | Discharge: 2020-11-27 | Disposition: A | Discharge status: Home / Self Care | Payer: Medicare Other | Attending: Emergency Medicine | Admitting: Emergency Medicine

## 2020-11-27 ENCOUNTER — Other Ambulatory Visit: Payer: Self-pay

## 2020-11-27 DIAGNOSIS — Z950 Presence of cardiac pacemaker: Secondary | ICD-10-CM | POA: Insufficient documentation

## 2020-11-27 DIAGNOSIS — I129 Hypertensive chronic kidney disease with stage 1 through stage 4 chronic kidney disease, or unspecified chronic kidney disease: Secondary | ICD-10-CM | POA: Insufficient documentation

## 2020-11-27 DIAGNOSIS — M25511 Pain in right shoulder: Secondary | ICD-10-CM | POA: Diagnosis not present

## 2020-11-27 DIAGNOSIS — N183 Chronic kidney disease, stage 3 unspecified: Secondary | ICD-10-CM | POA: Diagnosis not present

## 2020-11-27 DIAGNOSIS — Z7982 Long term (current) use of aspirin: Secondary | ICD-10-CM | POA: Diagnosis not present

## 2020-11-27 MED ORDER — LIDOCAINE 5 % EX PTCH
1.0000 | MEDICATED_PATCH | CUTANEOUS | Status: DC
  Administered 2020-11-27: 1 via TRANSDERMAL
  Filled 2020-11-27: qty 1

## 2020-11-27 MED ORDER — KETOROLAC TROMETHAMINE 60 MG/2ML IM SOLN
15.0000 mg | Freq: Once | INTRAMUSCULAR | Status: AC
  Administered 2020-11-27: 15 mg via INTRAMUSCULAR
  Filled 2020-11-27: qty 2

## 2020-11-27 MED ORDER — DICLOFENAC SODIUM 1 % EX GEL: 2.0000 g | Freq: Four times a day (QID) | CUTANEOUS | 0 refills | Status: DC

## 2020-11-27 NOTE — ED Notes (Signed)
Right shoulder pain increasing recently after using it to pull himself up. NAD. Resting in bed.

## 2020-11-27 NOTE — Discharge Instructions (Addendum)
No acute findings on x-ray of the right shoulder.  Read and follow discharge care instruction.  Wear Lidoderm patch for 12 hours wear arm sling for 3 to 5 days while awake.  Take medication as directed.

## 2020-11-27 NOTE — ED Triage Notes (Signed)
Pt reports last week was pulling himself up the stair and felt a pop in his right shoulder. Pt reports since then his right arm has became more painful to the point that now it hurts to move it.

## 2020-11-27 NOTE — ED Provider Notes (Signed)
[facility] Emergency Department Provider Note   ____________________________________________   Event Date/Time   First MD Initiated Contact with Patient 11/27/20 1108     (approximate)  I have reviewed the triage vital signs and the nursing notes.   HISTORY  Chief Complaint Shoulder Pain    HPI Howard Harmon is a 87 y.o. male patient presents with right shoulder pain secondary to a pulling incident last week.  Patient stated he felt a "pop" in his right shoulder.  Patient pain increased with overhead reaching and abduction.  Rates pain as a 4/10.  Described pain as "achy".  No palliative measure for complaint.         Past Medical History:  Diagnosis Date   Arthritis    Atrial fibrillation ([place])    Blood clotting tendency ([facility])    Chronic kidney disease    DVT (deep venous thrombosis) ([employer])    Hypertension    Melanoma ([place])    Pacemaker     Patient Active Problem List   Diagnosis Date Noted   Sepsis ([place]) 10/24/2018   Anemia    Grade II hemorrhoids    Acute posthemorrhagic anemia    Hypoxia 10/23/2017   Hematochezia    Benign neoplasm of ascending colon    Atrial fibrillation ([place]) 08/31/2016   Sinoatrial node dysfunction ([place]) 08/31/2016   History of DVT of lower extremity 06/14/2016   Aortic atherosclerosis ([place]) 11/10/2015   Chronic respiratory failure with hypoxia ([place]) 11/09/2015   Lumbar stenosis with neurogenic claudication 04/01/2015   DDD (degenerative disc disease), lumbosacral 01/26/2015   Rectal bleed 01/01/2015   GIB (gastrointestinal bleeding) 10/12/2014   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   CKD (chronic kidney disease), stage III ([place]) 10/12/2014   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   HTN (hypertension) 11/07/2013   OA (osteoarthritis) of knee 11/07/2013   Peripheral sensory neuropathy 11/07/2013   Malignant melanoma of skin of chest ([place]) 08/06/2013    Past Surgical  History:  Procedure Laterality Date   CHOLECYSTECTOMY     COLONOSCOPY WITH PROPOFOL N/A 10/15/2014   Procedure: COLONOSCOPY WITH PROPOFOL;  Surgeon: Josefine Class, MD;  Location: [facility] ENDOSCOPY;  Service: Endoscopy;  Laterality: N/A;   COLONOSCOPY WITH PROPOFOL N/A 10/23/2017   Procedure: COLONOSCOPY WITH PROPOFOL;  Surgeon: Lucilla Lame, MD;  Location: [facility] ENDOSCOPY;  Service: Endoscopy;  Laterality: N/A;   ESOPHAGOGASTRODUODENOSCOPY N/A 10/24/2017   Procedure: ESOPHAGOGASTRODUODENOSCOPY (EGD);  Surgeon: Virgel Manifold, MD;  Location: [facility] ENDOSCOPY;  Service: Endoscopy;  Laterality: N/A;   INSERT / REPLACE / REMOVE PACEMAKER      Prior to Admission medications   Medication Sig Start Date End Date Taking? Authorizing Provider  diclofenac Sodium (VOLTAREN) 1 % GEL Apply 2 g topically 4 (four) times daily. 11/27/20  Yes Sable Feil, PA-C  acetaminophen (TYLENOL) 500 MG tablet Take 500 mg by mouth at bedtime as needed for moderate pain.    [provider]  aspirin 81 MG chewable tablet Chew 81 mg by mouth daily.    [provider]  Cholecalciferol (VITAMIN D3 PO) Take 1 tablet by mouth daily.    [provider]  docusate sodium (COLACE) 100 MG capsule Take 100-200 mg by mouth 2 (two) times daily.     [provider]  furosemide (LASIX) 20 MG tablet Take 20 mg by mouth daily as needed for fluid.     [provider]  LACTASE ENZYME PO Take 1 tablet by  mouth as needed (for indigestion).     [provider]  MISC NATURAL PRODUCTS PO Take 1 tablet by mouth daily.    [provider]  omega-3 acid ethyl esters (LOVAZA) 1 g capsule Take 1-2 g by mouth 2 (two) times daily.    [provider]  RABEprazole (ACIPHEX) 20 MG tablet Take 20 mg by mouth daily.     [provider]  SENNA LEAVES PO Take 1 capsule by mouth 2 (two) times a day.     [provider]  tetrahydrozoline-zinc (VISINE-AC) 0.05-0.25 %  ophthalmic solution Place 1 drop into both eyes 3 (three) times daily as needed.    [provider]  traMADol (ULTRAM) 50 MG tablet Take 1 tablet (50 mg total) by mouth every 6 (six) hours as needed for moderate pain. 10/26/18   Demetrios Loll, MD  vitamin B-12 (CYANOCOBALAMIN) 1000 MCG tablet Take 1,000 mcg by mouth daily.    [provider]    Allergies Warfarin  Family History  Problem Relation Age of Onset   CAD Father    Prostate cancer Brother    Breast cancer Sister     Social History Social History   Tobacco Use   Smoking status: Never   Smokeless tobacco: Never  Vaping Use   Vaping Use: Never used  Substance Use Topics   Alcohol use: No   Drug use: Never    Review of Systems  Constitutional: No fever/chills Eyes: No visual changes. ENT: No sore throat. Cardiovascular: Denies chest pain.  History of A. fib. Respiratory: Denies shortness of breath. Gastrointestinal: No abdominal pain.  No nausea, no vomiting.  No diarrhea.  No constipation. Genitourinary: Negative for dysuria. Musculoskeletal: Right upper shoulder pain. Skin: Negative for rash. Neurological: Negative for headaches, focal weakness or numbness. Endocrine: CKD and hypertension. Hematological/Lymphatic: Anemia. Allergic/Immunilogical: Warfarin ____________________________________________   PHYSICAL EXAM:  VITAL SIGNS: ED Triage Vitals  Enc Vitals Group     BP 11/27/20 0947 (!) 158/85     Pulse Rate 11/27/20 0947 72     Resp 11/27/20 0947 18     Temp 11/27/20 0947 98 F (36.7 C)     Temp Source 11/27/20 0947 Oral     SpO2 11/27/20 0947 100 %     Weight 11/27/20 0941 180 lb (81.6 kg)     Height 11/27/20 0941 5\' 11"  (1.803 m)     Head Circumference --      Peak Flow --      Pain Score 11/27/20 0941 4     Pain Loc --      Pain Edu? --      Excl. in [place]? --     Constitutional: Alert and oriented. Well appearing and in no acute distress. Eyes: Conjunctivae are normal.  PERRL. EOMI. Neck: No cervical spine tenderness to palpation. Cardiovascular: Normal rate, regular rhythm. Grossly normal heart sounds.  Good peripheral circulation. Respiratory: Normal respiratory effort.  No retractions. Lungs CTAB. Genitourinary: Deferred Musculoskeletal: No obvious deformity to the right shoulder.  Moderate guarding palpation GH joint.  Decreased range of motion with adduction overhead reaching.  Neurologic:  Normal speech and language. No gross focal neurologic deficits are appreciated. No gait instability. Skin:  Skin is warm, dry and intact. No rash noted.  No abrasion or ecchymosis. Psychiatric: Mood and affect are normal. Speech and behavior are normal.  ____________________________________________   LABS (all labs ordered are listed, but only abnormal results are displayed)  Labs Reviewed - No  data to display ____________________________________________  EKG   ____________________________________________  RADIOLOGY I, Sable Feil, personally viewed and evaluated these images (plain radiographs) as part of my medical decision making, as well as reviewing the written report by the radiologist.  ED MD interpretation: Degenerative changes at the [facility] joint.  Official radiology report(s): DG Shoulder Right  Result Date: 11/27/2020 CLINICAL DATA:  Right shoulder pain x 1 week. Pain is worsening, pain only present when moving arm. Pt sts feels like a strain. Pt sts possible remote injury to shoulder but is uncertain EXAM: RIGHT SHOULDER - 2+ VIEW COMPARISON:  None. FINDINGS: No fracture. No bone lesion. Skeletal structures are diffusely demineralized. There is advanced arthropathic changes with notable remodeling the glenoid, creating a combined pseudo articulation between the glenoid fossa and acromion. AC joint is normally spaced and aligned. Adjacent soft tissue swelling is suggested. IMPRESSION: 1. No fracture or dislocation. 2. Advanced arthropathic changes of  the right glenohumeral joint consistent with an inflammatory arthropathy. Electronically Signed   By: Lajean Manes M.D.   On: 11/27/2020 11:26    ____________________________________________   PROCEDURES  Procedure(s) performed (including Critical Care):  Procedures   ____________________________________________   INITIAL IMPRESSION / ASSESSMENT AND PLAN / ED COURSE  As part of my medical decision making, I reviewed the following data within the [place]         Patient presents with right shoulder pain secondary to a pulling incident occurred a week ago.  Discussed no acute findings on x-ray with patient.  Patient complaining physical therapy for right shoulder pain secondary to osteoarthritis.  Patient placed in arm sling for comfort.  Patient given 15 mg of Toradol IM and a Lidoderm patch was applied.  Patient given discharge care instruction and a prescription for Voltaren cream.  Advised to follow-up with PCP.      ____________________________________________   FINAL CLINICAL IMPRESSION(S) / ED DIAGNOSES  Final diagnoses:  Acute pain of right shoulder     ED Discharge Orders          Ordered    diclofenac Sodium (VOLTAREN) 1 % GEL  4 times daily        11/27/20 1137             Note:  This document was prepared using Dragon voice recognition software and may include unintentional dictation errors.    Sable Feil, PA-C 11/27/20 1144    Vladimir Crofts, MD 11/27/20 [phone]

## 2020-12-04 NOTE — Progress Notes (Signed)
[place]  [phone] Fax:(336) [phone]  ID: Howard Harmon OB: [birth year]  MR#: [id number]  ACZ#:[id number]  Patient Care Team: Rusty Aus, MD as PCP - General (Internal Medicine)  CHIEF COMPLAINT: Iron deficiency anemia.  INTERVAL HISTORY: Patient is an 87 year old male who was last evaluated in clinic greater than 3 years ago.  He is referred back for worsening anemia.  He reports increased weakness and fatigue, but otherwise feels well.  He has no neurologic complaints.  He denies any recent fevers or illnesses.  He has a good appetite and denies weight loss.  He has no chest pain, shortness of breath, cough, or hemoptysis.  He denies any nausea, vomiting, constipation, or diarrhea.  He denies any melena or hematochezia.  He has no urinary complaints.  Patient otherwise feels well and offers no further specific complaints today.  REVIEW OF SYSTEMS:   Review of Systems  Constitutional:  Positive for malaise/fatigue. Negative for fever and weight loss.  Respiratory: Negative.  Negative for cough, hemoptysis and shortness of breath.   Cardiovascular: Negative.  Negative for chest pain and leg swelling.  Gastrointestinal: Negative.  Negative for abdominal pain, blood in stool and melena.  Genitourinary: Negative.  Negative for hematuria.  Musculoskeletal: Negative.  Negative for back pain.  Skin: Negative.  Negative for rash.  Neurological:  Positive for weakness. Negative for dizziness, focal weakness and headaches.  Psychiatric/Behavioral: Negative.  The patient is not nervous/anxious.    As per HPI. Otherwise, a complete review of systems is negative.  PAST MEDICAL HISTORY: Past Medical History:  Diagnosis Date   Arthritis    Atrial fibrillation ([place])    Blood clotting tendency (HCC)    Chronic kidney disease    DVT (deep venous thrombosis) (HCC)    Hypertension    Melanoma ([place])    Pacemaker     PAST SURGICAL HISTORY: Past Surgical  History:  Procedure Laterality Date   CHOLECYSTECTOMY     COLONOSCOPY WITH PROPOFOL N/A 10/15/2014   Procedure: COLONOSCOPY WITH PROPOFOL;  Surgeon: Josefine Class, MD;  Location: [facility] ENDOSCOPY;  Service: Endoscopy;  Laterality: N/A;   COLONOSCOPY WITH PROPOFOL N/A 10/23/2017   Procedure: COLONOSCOPY WITH PROPOFOL;  Surgeon: Lucilla Lame, MD;  Location: [facility] ENDOSCOPY;  Service: Endoscopy;  Laterality: N/A;   ESOPHAGOGASTRODUODENOSCOPY N/A 10/24/2017   Procedure: ESOPHAGOGASTRODUODENOSCOPY (EGD);  Surgeon: Virgel Manifold, MD;  Location: [facility] ENDOSCOPY;  Service: Endoscopy;  Laterality: N/A;   INSERT / REPLACE / REMOVE PACEMAKER      FAMILY HISTORY: Family History  Problem Relation Age of Onset   CAD Father    Prostate cancer Brother    Breast cancer Sister     ADVANCED DIRECTIVES (Y/N):  N  HEALTH MAINTENANCE: Social History   Tobacco Use   Smoking status: Never   Smokeless tobacco: Never  Vaping Use   Vaping Use: Never used  Substance Use Topics   Alcohol use: No   Drug use: Never     Colonoscopy:  PAP:  Bone density:  Lipid panel:  Allergies  Allergen Reactions   Warfarin Other (See Comments)    Bleeding diff to stop    Current Outpatient Medications  Medication Sig Dispense Refill   acetaminophen (TYLENOL) 500 MG tablet Take 500 mg by mouth at bedtime as needed for moderate pain.     aspirin 81 MG chewable tablet Chew 81 mg by mouth daily.     Cholecalciferol (VITAMIN D3 PO) Take 1 tablet by mouth  daily.     diclofenac Sodium (VOLTAREN) 1 % GEL Apply 2 g topically 4 (four) times daily. 100 g 0   docusate sodium (COLACE) 100 MG capsule Take 100-200 mg by mouth 2 (two) times daily.      furosemide (LASIX) 20 MG tablet Take 20 mg by mouth daily as needed for fluid.      LACTASE ENZYME PO Take 1 tablet by mouth as needed (for indigestion).      omega-3 acid ethyl esters (LOVAZA) 1 g capsule Take 1-2 g by mouth 2 (two) times daily.     RABEprazole  (ACIPHEX) 20 MG tablet Take 20 mg by mouth daily.      SENNA LEAVES PO Take 1 capsule by mouth 2 (two) times a day.      tetrahydrozoline-zinc (VISINE-AC) 0.05-0.25 % ophthalmic solution Place 1 drop into both eyes 3 (three) times daily as needed.     traMADol (ULTRAM) 50 MG tablet Take 1 tablet (50 mg total) by mouth every 6 (six) hours as needed for moderate pain. 16 tablet 0   vitamin B-12 (CYANOCOBALAMIN) 1000 MCG tablet Take 1,000 mcg by mouth daily.     No current facility-administered medications for this visit.    OBJECTIVE: Vitals:   12/07/20 1346 12/07/20 1348  BP:  132/76  Pulse:  78  Resp: 18   Temp: 98.2 F (36.8 C)      There is no height or weight on file to calculate BMI.    ECOG FS:1 - Symptomatic but completely ambulatory  General: Well-developed, well-nourished, no acute distress.  Sitting in wheelchair. Eyes: Pink conjunctiva, anicteric sclera. HEENT: Normocephalic, moist mucous membranes. Lungs: No audible wheezing or coughing. Heart: Regular rate and rhythm. Abdomen: Soft, nontender, no obvious distention. Musculoskeletal: No edema, cyanosis, or clubbing. Neuro: Alert, answering all questions appropriately. Cranial nerves grossly intact. Skin: No rashes or petechiae noted. Psych: Normal affect. Lymphatics: No cervical, calvicular, axillary or inguinal LAD.   LAB RESULTS:  Lab Results  Component Value Date   NA 142 10/26/2018   K 4.4 10/26/2018   CL 112 (H) 10/26/2018   CO2 22 10/26/2018   GLUCOSE 102 (H) 10/26/2018   BUN 27 (H) 10/26/2018   CREATININE 1.56 (H) 10/26/2018   CALCIUM 9.0 10/26/2018   PROT 7.0 10/23/2018   ALBUMIN 4.0 10/23/2018   AST 22 10/23/2018   ALT 15 10/23/2018   ALKPHOS 61 10/23/2018   BILITOT 0.9 10/23/2018   GFRNONAA 40 (L) 10/26/2018   GFRAA 46 (L) 10/26/2018    Lab Results  Component Value Date   WBC 15.4 (H) 12/07/2020   NEUTROABS 13.6 (H) 10/23/2018   HGB 10.7 (L) 12/07/2020   HCT 32.6 (L) 12/07/2020   MCV  103.5 (H) 12/07/2020   PLT 159 12/07/2020     STUDIES: DG Shoulder Right  Result Date: 11/27/2020 CLINICAL DATA:  Right shoulder pain x 1 week. Pain is worsening, pain only present when moving arm. Pt sts feels like a strain. Pt sts possible remote injury to shoulder but is uncertain EXAM: RIGHT SHOULDER - 2+ VIEW COMPARISON:  None. FINDINGS: No fracture. No bone lesion. Skeletal structures are diffusely demineralized. There is advanced arthropathic changes with notable remodeling the glenoid, creating a combined pseudo articulation between the glenoid fossa and acromion. AC joint is normally spaced and aligned. Adjacent soft tissue swelling is suggested. IMPRESSION: 1. No fracture or dislocation. 2. 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 Electronically Signed  By: Lajean Manes M.D.   On: 11/27/2020 11:26    ASSESSMENT: Iron deficiency anemia.  PLAN:    Iron deficiency anemia: Patient's hemoglobin remains decreased, but improved from recent values up to 10.7.  Reticulocyte count is inappropriately normal.  The remainder of his laboratory work including iron stores, B12, folate, and hemolysis labs are all pending at time of dictation.  Plan to schedule patient for IV iron in 1 and 2 weeks, but if his iron stores returned within normal limits, will cancel infusion and monitor patient. Elevated MCV: B12 and folate are pending at time of dictation.  Patient's with underlying MDS can also have elevated MCV.  This would take a bone marrow biopsy to diagnose which is unnecessary at this point. Leukocytosis: Likely reactive, monitor.  Can consider a flow cytometry in the future if it may remain persistently elevated.  Patient expressed understanding and was in agreement with this plan. He also understands that He can call clinic at any time with any questions, concerns, or complaints.    Lloyd Huger, MD   12/07/2020 5:02  PM

## 2020-12-07 ENCOUNTER — Inpatient Hospital Stay: Payer: Medicare Other

## 2020-12-07 ENCOUNTER — Inpatient Hospital Stay: Payer: Medicare Other | Attending: Oncology | Admitting: Oncology

## 2020-12-07 ENCOUNTER — Encounter: Payer: Self-pay | Admitting: Oncology

## 2020-12-07 VITALS — BP 132/76 | HR 78 | Temp 98.2°F | Resp 18

## 2020-12-07 DIAGNOSIS — D509 Iron deficiency anemia, unspecified: Secondary | ICD-10-CM

## 2020-12-07 DIAGNOSIS — R718 Other abnormality of red blood cells: Secondary | ICD-10-CM | POA: Diagnosis not present

## 2020-12-07 LAB — CBC
HCT: 32.6 % — ABNORMAL LOW (ref 39.0–52.0)
Hemoglobin: 10.7 g/dL — ABNORMAL LOW (ref 13.0–17.0)
MCH: 34 pg (ref 26.0–34.0)
MCHC: 32.8 g/dL (ref 30.0–36.0)
MCV: 103.5 fL — ABNORMAL HIGH (ref 80.0–100.0)
Platelets: 159 10*3/uL (ref 150–400)
RBC: 3.15 MIL/uL — ABNORMAL LOW (ref 4.22–5.81)
RDW: 16.9 % — ABNORMAL HIGH (ref 11.5–15.5)
WBC: 15.4 10*3/uL — ABNORMAL HIGH (ref 4.0–10.5)
nRBC: 0 % (ref 0.0–0.2)

## 2020-12-07 LAB — RETICULOCYTES
Immature Retic Fract: 15.4 % (ref 2.3–15.9)
RBC.: 3.16 MIL/uL — ABNORMAL LOW (ref 4.22–5.81)
Retic Count, Absolute: 95.1 10*3/uL (ref 19.0–186.0)
Retic Ct Pct: 3 % (ref 0.4–3.1)

## 2020-12-07 LAB — IRON AND TIBC
Iron: 27 ug/dL — ABNORMAL LOW (ref 45–182)
Saturation Ratios: 10 % — ABNORMAL LOW (ref 17.9–39.5)
TIBC: 284 ug/dL (ref 250–450)
UIBC: 257 ug/dL

## 2020-12-07 LAB — LACTATE DEHYDROGENASE: LDH: 198 U/L — ABNORMAL HIGH (ref 98–192)

## 2020-12-07 LAB — DAT, POLYSPECIFIC AHG (ARMC ONLY): Polyspecific AHG test: NEGATIVE

## 2020-12-07 LAB — FERRITIN: Ferritin: 99 ng/mL (ref 24–336)

## 2020-12-07 LAB — VITAMIN B12: Vitamin B-12: 2079 pg/mL — ABNORMAL HIGH (ref 180–914)

## 2020-12-07 LAB — FOLATE: Folate: 58 ng/mL (ref >5.9)

## 2020-12-07 NOTE — Progress Notes (Signed)
Patient here today for new evaluation regarding anemia.  

## 2020-12-08 LAB — ERYTHROPOIETIN: Erythropoietin: 24.3 m[IU]/mL — ABNORMAL HIGH (ref 2.6–18.5)

## 2020-12-08 LAB — HAPTOGLOBIN: Haptoglobin: 139 mg/dL (ref 38–329)

## 2020-12-13 ENCOUNTER — Inpatient Hospital Stay: Payer: Medicare Other

## 2020-12-13 VITALS — BP 145/83 | HR 72 | Temp 98.5°F | Resp 18

## 2020-12-13 DIAGNOSIS — D500 Iron deficiency anemia secondary to blood loss (chronic): Secondary | ICD-10-CM

## 2020-12-13 DIAGNOSIS — D509 Iron deficiency anemia, unspecified: Secondary | ICD-10-CM | POA: Diagnosis not present

## 2020-12-13 MED ORDER — SODIUM CHLORIDE 0.9 % IV SOLN
Freq: Once | INTRAVENOUS | Status: AC
  Filled 2020-12-13: qty 250

## 2020-12-13 MED ORDER — SODIUM CHLORIDE 0.9 % IV SOLN: 200.0000 mg | Freq: Once | INTRAVENOUS | Status: DC

## 2020-12-13 MED ORDER — IRON SUCROSE 20 MG/ML IV SOLN
200.0000 mg | Freq: Once | INTRAVENOUS | Status: AC
Start: 2020-12-13 — End: 2020-12-13
  Administered 2020-12-13: 200 mg via INTRAVENOUS
  Filled 2020-12-13: qty 10

## 2020-12-13 NOTE — Patient Instructions (Signed)
[facility] [place] [facility]  Discharge Instructions: Thank you for choosing [facility] to provide your oncology and hematology care.  If you have a lab appointment with the Cancer Center, please go directly to the Cancer Center and check in at the registration area.  Wear comfortable clothing and clothing appropriate for easy access to any Portacath or PICC line.   We strive to give you quality time with your provider. You may need to reschedule your appointment if you arrive late (15 or more minutes).  Arriving late affects you and other patients whose appointments are after yours.  Also, if you miss three or more appointments without notifying the office, you may be dismissed from the clinic at the provider's discretion.      For prescription refill requests, have your pharmacy contact our office and allow 72 hours for refills to be completed.    Today you received the following chemotherapy and/or immunotherapy agents VENOFER      To help prevent nausea and vomiting after your treatment, we encourage you to take your nausea medication as directed.  BELOW ARE SYMPTOMS THAT SHOULD BE REPORTED IMMEDIATELY: *FEVER GREATER THAN 100.4 F (38 C) OR HIGHER *CHILLS OR SWEATING *NAUSEA AND VOMITING THAT IS NOT CONTROLLED WITH YOUR NAUSEA MEDICATION *UNUSUAL SHORTNESS OF BREATH *UNUSUAL BRUISING OR BLEEDING *URINARY PROBLEMS (pain or burning when urinating, or frequent urination) *BOWEL PROBLEMS (unusual diarrhea, constipation, pain near the anus) TENDERNESS IN MOUTH AND THROAT WITH OR WITHOUT PRESENCE OF ULCERS (sore throat, sores in mouth, or a toothache) UNUSUAL RASH, SWELLING OR PAIN  UNUSUAL VAGINAL DISCHARGE OR ITCHING   Items with * indicate a potential emergency and should be followed up as soon as possible or go to the Emergency Department if any problems should occur.  Please show the CHEMOTHERAPY ALERT CARD or IMMUNOTHERAPY ALERT CARD at check-in to  the Emergency Department and triage nurse.  Should you have questions after your visit or need to cancel or reschedule your appointment, please contact [facility] [place] [facility]  [phone] and follow the prompts.  Office hours are 8:00 a.m. to 4:30 p.m. Monday - Friday. Please note that voicemails left after 4:00 p.m. may not be returned until the following business day.  We are closed weekends and major holidays. You have access to a nurse at all times for urgent questions. Please call the main number to the clinic [phone] and follow the prompts.  For any non-urgent questions, you may also contact your provider using MyChart. We now offer e-Visits for anyone 18 and older to request care online for non-urgent symptoms. For details visit [email or web address].   Also download the MyChart app! Go to the app store, search "MyChart", open the app, select [facility], and log in with your MyChart username and password.  Due to Covid, a mask is required upon entering the hospital/clinic. If you do not have a mask, one will be given to you upon arrival. For doctor visits, patients may have 1 support person aged 18 or older with them. For treatment visits, patients cannot have anyone with them due to current Covid guidelines and our immunocompromised population.   Iron Sucrose injection What is this medication? IRON SUCROSE (AHY ern SOO krohs) is an iron complex. Iron is used to make healthy red blood cells, which carry oxygen and nutrients throughout the body. This medicine is used to treat iron deficiency anemia in people with chronickidney disease. This medicine may be used for other   purposes; ask your health care provider orpharmacist if you have questions. COMMON BRAND NAME(S): Venofer What should I tell my care team before I take this medication? They need to know if you have any of these conditions: anemia not caused by low iron levels heart disease high levels of  iron in the blood kidney disease liver disease an unusual or allergic reaction to iron, other medicines, foods, dyes, or preservatives pregnant or trying to get pregnant breast-feeding How should I use this medication? This medicine is for infusion into a vein. It is given by a health careprofessional in a hospital or clinic setting. Talk to your pediatrician regarding the use of this medicine in children. While this drug may be prescribed for children as young as 2 years for selectedconditions, precautions do apply. Overdosage: If you think you have taken too much of this medicine contact apoison control center or emergency room at once. NOTE: This medicine is only for you. Do not share this medicine with others. What if I miss a dose? It is important not to miss your dose. Call your doctor or health careprofessional if you are unable to keep an appointment. What may interact with this medication? Do not take this medicine with any of the following medications: deferoxamine dimercaprol other iron products This medicine may also interact with the following medications: chloramphenicol deferasirox This list may not describe all possible interactions. Give your health care provider a list of all the medicines, herbs, non-prescription drugs, or dietary supplements you use. Also tell them if you smoke, drink alcohol, or use illegaldrugs. Some items may interact with your medicine. What should I watch for while using this medication? Visit your doctor or healthcare professional regularly. Tell your doctor or healthcare professional if your symptoms do not start to get better or if theyget worse. You may need blood work done while you are taking this medicine. You may need to follow a special diet. Talk to your doctor. Foods that contain iron include: whole grains/cereals, dried fruits, beans, or peas, leafy greenvegetables, and organ meats (liver, kidney). What side effects may I notice from  receiving this medication? Side effects that you should report to your doctor or health care professionalas soon as possible: allergic reactions like skin rash, itching or hives, swelling of the face, lips, or tongue breathing problems changes in blood pressure cough fast, irregular heartbeat feeling faint or lightheaded, falls fever or chills flushing, sweating, or hot feelings joint or muscle aches/pains seizures swelling of the ankles or feet unusually weak or tired Side effects that usually do not require medical attention (report to yourdoctor or health care professional if they continue or are bothersome): diarrhea feeling achy headache irritation at site where injected nausea, vomiting stomach upset tiredness This list may not describe all possible side effects. Call your doctor for medical advice about side effects. You may report side effects to FDA at1-800-FDA-1088. Where should I keep my medication? This drug is given in a hospital or clinic and will not be stored at home. NOTE: This sheet is a summary. It may not cover all possible information. If you have questions about this medicine, talk to your doctor, pharmacist, orhealth care provider.  2022 Elsevier/Gold Standard (2011-03-02 17:14:35)  

## 2020-12-17 ENCOUNTER — Inpatient Hospital Stay: Payer: Medicare Other

## 2020-12-17 ENCOUNTER — Other Ambulatory Visit: Payer: Self-pay

## 2020-12-17 VITALS — BP 134/71 | HR 61 | Temp 97.0°F | Resp 18

## 2020-12-17 DIAGNOSIS — D509 Iron deficiency anemia, unspecified: Secondary | ICD-10-CM | POA: Diagnosis not present

## 2020-12-17 DIAGNOSIS — D500 Iron deficiency anemia secondary to blood loss (chronic): Secondary | ICD-10-CM

## 2020-12-17 MED ORDER — IRON SUCROSE 20 MG/ML IV SOLN
200.0000 mg | Freq: Once | INTRAVENOUS | Status: AC
  Administered 2020-12-17: 200 mg via INTRAVENOUS
  Filled 2020-12-17: qty 10

## 2020-12-17 MED ORDER — SODIUM CHLORIDE 0.9 % IV SOLN
Freq: Once | INTRAVENOUS | Status: AC
  Filled 2020-12-17: qty 250

## 2020-12-17 MED ORDER — SODIUM CHLORIDE 0.9 % IV SOLN: 200.0000 mg | Freq: Once | INTRAVENOUS | Status: DC

## 2020-12-17 NOTE — Patient Instructions (Signed)

## 2020-12-24 ENCOUNTER — Ambulatory Visit: Payer: Medicare Other

## 2021-02-21 NOTE — Progress Notes (Signed)
[place]  Telephone:(336) [phone] Fax:(336) [phone]  ID: Howard Harmon OB: 01-04-1930  MR#: [id number]  QIO#:[id number]  Patient Care Team: Rusty Aus, MD as PCP - General (Internal Medicine)  CHIEF COMPLAINT: Iron deficiency anemia.  INTERVAL HISTORY: Patient returns to clinic today for repeat laboratory work, further evaluation, and consideration of IV Venofer.  He complains of occasional dyspnea on exertion, but otherwise feels well.  He does not complain of any weakness or fatigue today.  He has no neurologic complaints.  He denies any recent fevers or illnesses.  He has a good appetite and denies weight loss.  He has no chest pain, shortness of breath, cough, or hemoptysis.  He denies any nausea, vomiting, constipation, or diarrhea.  He denies any melena or hematochezia.  He has no urinary complaints.  Patient offers no further specific complaints today.  REVIEW OF SYSTEMS:   Review of Systems  Constitutional: Negative.  Negative for fever, malaise/fatigue and weight loss.  Respiratory: Negative.  Negative for cough, hemoptysis and shortness of breath.   Cardiovascular: Negative.  Negative for chest pain and leg swelling.  Gastrointestinal: Negative.  Negative for abdominal pain, blood in stool and melena.  Genitourinary: Negative.  Negative for hematuria.  Musculoskeletal: Negative.  Negative for back pain.  Skin: Negative.  Negative for rash.  Neurological: Negative.  Negative for dizziness, focal weakness, weakness and headaches.  Psychiatric/Behavioral: Negative.  The patient is not nervous/anxious.    As per HPI. Otherwise, a complete review of systems is negative.  PAST MEDICAL HISTORY: Past Medical History:  Diagnosis Date   Arthritis    Atrial fibrillation ([place])    Blood clotting tendency (HCC)    Chronic kidney disease    DVT (deep venous thrombosis) (HCC)    Hypertension    Melanoma ([place])    Pacemaker     PAST SURGICAL  HISTORY: Past Surgical History:  Procedure Laterality Date   CHOLECYSTECTOMY     COLONOSCOPY WITH PROPOFOL N/A 10/15/2014   Procedure: COLONOSCOPY WITH PROPOFOL;  Surgeon: Josefine Class, MD;  Location: [facility] ENDOSCOPY;  Service: Endoscopy;  Laterality: N/A;   COLONOSCOPY WITH PROPOFOL N/A 10/23/2017   Procedure: COLONOSCOPY WITH PROPOFOL;  Surgeon: Lucilla Lame, MD;  Location: [facility] ENDOSCOPY;  Service: Endoscopy;  Laterality: N/A;   ESOPHAGOGASTRODUODENOSCOPY N/A 10/24/2017   Procedure: ESOPHAGOGASTRODUODENOSCOPY (EGD);  Surgeon: Virgel Manifold, MD;  Location: [facility] ENDOSCOPY;  Service: Endoscopy;  Laterality: N/A;   INSERT / REPLACE / REMOVE PACEMAKER      FAMILY HISTORY: Family History  Problem Relation Age of Onset   CAD Father    Prostate cancer Brother    Breast cancer Sister     ADVANCED DIRECTIVES (Y/N):  N  HEALTH MAINTENANCE: Social History   Tobacco Use   Smoking status: Never   Smokeless tobacco: Never  Vaping Use   Vaping Use: Never used  Substance Use Topics   Alcohol use: No   Drug use: Never     Colonoscopy:  PAP:  Bone density:  Lipid panel:  Allergies  Allergen Reactions   Warfarin Other (See Comments)    Bleeding diff to stop    Current Outpatient Medications  Medication Sig Dispense Refill   acetaminophen (TYLENOL) 500 MG tablet Take 500 mg by mouth at bedtime as needed for moderate pain.     aspirin 81 MG chewable tablet Chew 81 mg by mouth daily.     Cholecalciferol (VITAMIN D3 PO) Take 1 tablet by mouth daily.  diclofenac Sodium (VOLTAREN) 1 % GEL Apply 2 g topically 4 (four) times daily. 100 g 0   docusate sodium (COLACE) 100 MG capsule Take 100-200 mg by mouth 2 (two) times daily.      furosemide (LASIX) 20 MG tablet Take 20 mg by mouth daily as needed for fluid.      LACTASE ENZYME PO Take 1 tablet by mouth as needed (for indigestion).      omega-3 acid ethyl esters (LOVAZA) 1 g capsule Take 1-2 g by mouth 2 (two) times daily.      RABEprazole (ACIPHEX) 20 MG tablet Take 20 mg by mouth daily.      SENNA LEAVES PO Take 1 capsule by mouth 2 (two) times a day.      tetrahydrozoline-zinc (VISINE-AC) 0.05-0.25 % ophthalmic solution Place 1 drop into both eyes 3 (three) times daily as needed.     traMADol (ULTRAM) 50 MG tablet Take 1 tablet (50 mg total) by mouth every 6 (six) hours as needed for moderate pain. 16 tablet 0   vitamin B-12 (CYANOCOBALAMIN) 1000 MCG tablet Take 1,000 mcg by mouth daily.     No current facility-administered medications for this visit.    OBJECTIVE: Vitals:   02/24/21 1416  BP: 133/82  Pulse: 75  Resp: 16  Temp: (!) 96.7 F (35.9 C)  SpO2: 97%     Body mass index is 24.41 kg/m.    ECOG FS:1 - Symptomatic but completely ambulatory  General: Well-developed, well-nourished, no acute distress. Eyes: Pink conjunctiva, anicteric sclera. HEENT: Normocephalic, moist mucous membranes. Lungs: No audible wheezing or coughing. Heart: Regular rate and rhythm. Abdomen: Soft, nontender, no obvious distention. Musculoskeletal: No edema, cyanosis, or clubbing. Neuro: Alert, answering all questions appropriately. Cranial nerves grossly intact. Skin: No rashes or petechiae noted. Psych: Normal affect.   LAB RESULTS:  Lab Results  Component Value Date   NA 142 10/26/2018   K 4.4 10/26/2018   CL 112 (H) 10/26/2018   CO2 22 10/26/2018   GLUCOSE 102 (H) 10/26/2018   BUN 27 (H) 10/26/2018   CREATININE 1.56 (H) 10/26/2018   CALCIUM 9.0 10/26/2018   PROT 7.0 10/23/2018   ALBUMIN 4.0 10/23/2018   AST 22 10/23/2018   ALT 15 10/23/2018   ALKPHOS 61 10/23/2018   BILITOT 0.9 10/23/2018   GFRNONAA 40 (L) 10/26/2018   GFRAA 46 (L) 10/26/2018    Lab Results  Component Value Date   WBC 9.1 02/24/2021   NEUTROABS 5.9 02/24/2021   HGB 11.8 (L) 02/24/2021   HCT 36.5 (L) 02/24/2021   MCV 102.0 (H) 02/24/2021   PLT 124 (L) 02/24/2021   Lab Results  Component Value Date   IRON 86 02/24/2021    TIBC 283 02/24/2021   IRONPCTSAT 30 02/24/2021   Lab Results  Component Value Date   FERRITIN 116 02/24/2021     STUDIES: No results found.  ASSESSMENT: Iron deficiency anemia.  PLAN:    Iron deficiency anemia: Patient's hemoglobin has improved to 11.8.  His iron stores continue to be within normal limits.  Previously, the remainder of his laboratory work including B12, folate, and hemolysis labs were either negative or within normal limits.  Patient does not require IV iron at this time.  Return to clinic in 2 months with repeat laboratory work, further evaluation, and consideration of treatment if needed.   Elevated MCV: B12 and folate are within normal limits.  Patient's with underlying MDS can also have elevated MCV.  This would take  a bone marrow biopsy to diagnose which is unnecessary at this point. Leukocytosis: Resolved.  I spent a total of 20 minutes reviewing chart data, face-to-face evaluation with the patient, counseling and coordination of care as detailed above.   Patient expressed understanding and was in agreement with this plan. He also understands that He can call clinic at any time with any questions, concerns, or complaints.    Lloyd Huger, MD   02/26/2021 12:23 PM

## 2021-02-23 ENCOUNTER — Other Ambulatory Visit: Payer: Self-pay | Admitting: Emergency Medicine

## 2021-02-23 DIAGNOSIS — D500 Iron deficiency anemia secondary to blood loss (chronic): Secondary | ICD-10-CM

## 2021-02-24 ENCOUNTER — Inpatient Hospital Stay (HOSPITAL_BASED_OUTPATIENT_CLINIC_OR_DEPARTMENT_OTHER): Payer: Medicare Other | Admitting: Oncology

## 2021-02-24 ENCOUNTER — Inpatient Hospital Stay: Payer: Medicare Other

## 2021-02-24 ENCOUNTER — Inpatient Hospital Stay: Payer: Medicare Other | Attending: Oncology

## 2021-02-24 ENCOUNTER — Other Ambulatory Visit: Payer: Self-pay | Admitting: Emergency Medicine

## 2021-02-24 VITALS — BP 133/82 | HR 75 | Temp 96.7°F | Resp 16 | Wt 175.0 lb

## 2021-02-24 DIAGNOSIS — R0609 Other forms of dyspnea: Secondary | ICD-10-CM | POA: Diagnosis not present

## 2021-02-24 DIAGNOSIS — Z86718 Personal history of other venous thrombosis and embolism: Secondary | ICD-10-CM | POA: Diagnosis not present

## 2021-02-24 DIAGNOSIS — D509 Iron deficiency anemia, unspecified: Secondary | ICD-10-CM

## 2021-02-24 DIAGNOSIS — I129 Hypertensive chronic kidney disease with stage 1 through stage 4 chronic kidney disease, or unspecified chronic kidney disease: Secondary | ICD-10-CM | POA: Insufficient documentation

## 2021-02-24 DIAGNOSIS — N189 Chronic kidney disease, unspecified: Secondary | ICD-10-CM | POA: Insufficient documentation

## 2021-02-24 DIAGNOSIS — Z950 Presence of cardiac pacemaker: Secondary | ICD-10-CM | POA: Insufficient documentation

## 2021-02-24 DIAGNOSIS — D500 Iron deficiency anemia secondary to blood loss (chronic): Secondary | ICD-10-CM

## 2021-02-24 LAB — CBC WITH DIFFERENTIAL/PLATELET
Abs Immature Granulocytes: 0.03 10*3/uL (ref 0.00–0.07)
Basophils Absolute: 0 10*3/uL (ref 0.0–0.1)
Basophils Relative: 0 %
Eosinophils Absolute: 0.4 10*3/uL (ref 0.0–0.5)
Eosinophils Relative: 4 %
HCT: 36.5 % — ABNORMAL LOW (ref 39.0–52.0)
Hemoglobin: 11.8 g/dL — ABNORMAL LOW (ref 13.0–17.0)
Immature Granulocytes: 0 %
Lymphocytes Relative: 21 %
Lymphs Abs: 1.9 10*3/uL (ref 0.7–4.0)
MCH: 33 pg (ref 26.0–34.0)
MCHC: 32.3 g/dL (ref 30.0–36.0)
MCV: 102 fL — ABNORMAL HIGH (ref 80.0–100.0)
Monocytes Absolute: 0.8 10*3/uL (ref 0.1–1.0)
Monocytes Relative: 9 %
Neutro Abs: 5.9 10*3/uL (ref 1.7–7.7)
Neutrophils Relative %: 66 %
Platelets: 124 10*3/uL — ABNORMAL LOW (ref 150–400)
RBC: 3.58 MIL/uL — ABNORMAL LOW (ref 4.22–5.81)
RDW: 15.4 % (ref 11.5–15.5)
WBC: 9.1 10*3/uL (ref 4.0–10.5)
nRBC: 0 % (ref 0.0–0.2)

## 2021-02-24 LAB — IRON AND TIBC
Iron: 86 ug/dL (ref 45–182)
Saturation Ratios: 30 % (ref 17.9–39.5)
TIBC: 283 ug/dL (ref 250–450)
UIBC: 197 ug/dL

## 2021-02-24 LAB — FERRITIN: Ferritin: 116 ng/mL (ref 24–336)

## 2021-02-24 NOTE — Progress Notes (Signed)
Pt endorses mild shortness of breath and fatigue with exertion. No other concerns at this time.

## 2021-02-26 ENCOUNTER — Encounter: Payer: Self-pay | Admitting: Oncology

## 2021-06-09 ENCOUNTER — Other Ambulatory Visit: Payer: Self-pay

## 2021-06-09 ENCOUNTER — Emergency Department
Admission: EM | Admit: 2021-06-09 | Discharge: 2021-06-09 | Disposition: A | Discharge status: Home / Self Care | Payer: Medicare Other | Attending: Emergency Medicine | Admitting: Emergency Medicine

## 2021-06-09 ENCOUNTER — Emergency Department: Payer: Medicare Other

## 2021-06-09 DIAGNOSIS — S065X0A Traumatic subdural hemorrhage without loss of consciousness, initial encounter: Secondary | ICD-10-CM | POA: Diagnosis not present

## 2021-06-09 DIAGNOSIS — Z950 Presence of cardiac pacemaker: Secondary | ICD-10-CM | POA: Diagnosis not present

## 2021-06-09 DIAGNOSIS — I129 Hypertensive chronic kidney disease with stage 1 through stage 4 chronic kidney disease, or unspecified chronic kidney disease: Secondary | ICD-10-CM | POA: Insufficient documentation

## 2021-06-09 DIAGNOSIS — S92414A Nondisplaced fracture of proximal phalanx of right great toe, initial encounter for closed fracture: Secondary | ICD-10-CM | POA: Diagnosis not present

## 2021-06-09 DIAGNOSIS — S92511B Displaced fracture of proximal phalanx of right lesser toe(s), initial encounter for open fracture: Secondary | ICD-10-CM | POA: Diagnosis not present

## 2021-06-09 DIAGNOSIS — W01198A Fall on same level from slipping, tripping and stumbling with subsequent striking against other object, initial encounter: Secondary | ICD-10-CM | POA: Insufficient documentation

## 2021-06-09 DIAGNOSIS — N189 Chronic kidney disease, unspecified: Secondary | ICD-10-CM | POA: Diagnosis not present

## 2021-06-09 DIAGNOSIS — S065XAA Traumatic subdural hemorrhage with loss of consciousness status unknown, initial encounter: Secondary | ICD-10-CM

## 2021-06-09 DIAGNOSIS — Z23 Encounter for immunization: Secondary | ICD-10-CM | POA: Insufficient documentation

## 2021-06-09 DIAGNOSIS — Z7982 Long term (current) use of aspirin: Secondary | ICD-10-CM | POA: Diagnosis not present

## 2021-06-09 DIAGNOSIS — S0990XA Unspecified injury of head, initial encounter: Secondary | ICD-10-CM | POA: Diagnosis present

## 2021-06-09 DIAGNOSIS — W19XXXA Unspecified fall, initial encounter: Secondary | ICD-10-CM

## 2021-06-09 DIAGNOSIS — S92501B Displaced unspecified fracture of right lesser toe(s), initial encounter for open fracture: Secondary | ICD-10-CM

## 2021-06-09 LAB — PROTIME-INR
INR: 1 (ref 0.8–1.2)
Prothrombin Time: 13.2 seconds (ref 11.4–15.2)

## 2021-06-09 LAB — CBC
HCT: 32.8 % — ABNORMAL LOW (ref 39.0–52.0)
Hemoglobin: 10.6 g/dL — ABNORMAL LOW (ref 13.0–17.0)
MCH: 33.9 pg (ref 26.0–34.0)
MCHC: 32.3 g/dL (ref 30.0–36.0)
MCV: 104.8 fL — ABNORMAL HIGH (ref 80.0–100.0)
Platelets: 147 10*3/uL — ABNORMAL LOW (ref 150–400)
RBC: 3.13 MIL/uL — ABNORMAL LOW (ref 4.22–5.81)
RDW: 15.3 % (ref 11.5–15.5)
WBC: 8.9 10*3/uL (ref 4.0–10.5)
nRBC: 0 % (ref 0.0–0.2)

## 2021-06-09 LAB — BASIC METABOLIC PANEL
Anion gap: 5 (ref 5–15)
BUN: 28 mg/dL — ABNORMAL HIGH (ref 8–23)
CO2: 24 mmol/L (ref 22–32)
Calcium: 9 mg/dL (ref 8.9–10.3)
Chloride: 113 mmol/L — ABNORMAL HIGH (ref 98–111)
Creatinine, Ser: 1.71 mg/dL — ABNORMAL HIGH (ref 0.61–1.24)
GFR, Estimated: 38 mL/min — ABNORMAL LOW (ref >60)
Glucose, Bld: 117 mg/dL — ABNORMAL HIGH (ref 70–99)
Potassium: 5.2 mmol/L — ABNORMAL HIGH (ref 3.5–5.1)
Sodium: 142 mmol/L (ref 135–145)

## 2021-06-09 MED ORDER — CEPHALEXIN 500 MG PO CAPS
500.0000 mg | ORAL_CAPSULE | Freq: Once | ORAL | Status: AC
  Administered 2021-06-09: 500 mg via ORAL
  Filled 2021-06-09: qty 1

## 2021-06-09 MED ORDER — TETANUS-DIPHTH-ACELL PERTUSSIS 5-2.5-18.5 LF-MCG/0.5 IM SUSY
0.5000 mL | PREFILLED_SYRINGE | Freq: Once | INTRAMUSCULAR | Status: AC
  Administered 2021-06-09: 0.5 mL via INTRAMUSCULAR
  Filled 2021-06-09: qty 0.5

## 2021-06-09 MED ORDER — BACITRACIN ZINC 500 UNIT/GM EX OINT
TOPICAL_OINTMENT | Freq: Once | CUTANEOUS | Status: AC
  Filled 2021-06-09: qty 0.9

## 2021-06-09 MED ORDER — CEPHALEXIN 500 MG PO CAPS: 500.0000 mg | ORAL_CAPSULE | Freq: Four times a day (QID) | ORAL | 0 refills | Status: DC

## 2021-06-09 MED ORDER — LIDOCAINE HCL (PF) 1 % IJ SOLN
5.0000 mL | Freq: Once | INTRAMUSCULAR | Status: AC
  Administered 2021-06-09: 5 mL via INTRADERMAL
  Filled 2021-06-09: qty 5

## 2021-06-09 MED ORDER — SODIUM CHLORIDE 0.9 % IV BOLUS (SEPSIS)
500.0000 mL | Freq: Once | INTRAVENOUS | Status: AC
  Administered 2021-06-09: 500 mL via INTRAVENOUS

## 2021-06-09 MED ORDER — CEPHALEXIN 500 MG PO CAPS: 500.0000 mg | ORAL_CAPSULE | Freq: Four times a day (QID) | ORAL | 0 refills | Status: AC

## 2021-06-09 NOTE — Discharge Instructions (Addendum)
You may take Tylenol 1000 mg every 6 hours as needed for pain.  I recommend close follow-up with neurosurgery and orthopedics as an outpatient.  Please call to schedule an appointment.  You may clean the wounds to your foot once daily and apply over-the-counter Neosporin and a sterile dressing.  I recommend that you wear your postop shoe when ambulating to protect your foot.  Please monitor for any signs of infection including redness, warmth, swelling, drainage of pus, fever of 100.4 or higher.  If you develop any of the symptoms, please return to the emergency department.  The sutures in your foot are absorbable and do not need to be taken out.  They will come out on their own in 1 to 2 weeks.  Please take your antibiotics until complete (four times a day for one week).  I recommend that you take an over-the-counter probiotic while you are on these antibiotics.  You had a small area of bleeding in the left side of your brain.  Repeat CT shows that this is stable.  If you develop severe headache, vomiting, vision or speech changes, confusion, numbness or weakness, please return to the emergency department.  I recommend that you hold your aspirin for the next week.

## 2021-06-09 NOTE — ED Provider Notes (Addendum)
[facility] Provider Note    Event Date/Time   First MD Initiated Contact with Patient 06/09/21 [phone]     (approximate)   History   Fall   HPI  Howard Harmon is a 88 y.o. male history of IDA, sinus node dysfunction status post pacemaker, atrial fibrillation, DVT, hypertension, chronic kidney disease who presents to the emergency department with his daughter after he had a fall tonight.  States that he uses a walker and that he tripped tonight and fell.  He did hit his head but denies loss of consciousness, headache, neck or back pain.  States he injured his right foot and has a laceration underneath the proximal right fifth toe.  States he has neuropathy in his feet but no new numbness, tingling or weakness.  No preceding symptoms that led to his fall including fevers, cough, chest pain, shortness of breath, vomiting, diarrhea.  Daughter reports that he is on aspirin but no anticoagulation.  History provided by daughter and patient.      Past Medical History:  Diagnosis Date   Arthritis    Atrial fibrillation ([place])    Blood clotting tendency (HCC)    Chronic kidney disease    DVT (deep venous thrombosis) ([place])    Hypertension    Melanoma ([place])    Pacemaker     Past Surgical History:  Procedure Laterality Date   CHOLECYSTECTOMY     COLONOSCOPY WITH PROPOFOL N/A 10/15/2014   Procedure: COLONOSCOPY WITH PROPOFOL;  Surgeon: Josefine Class, MD;  Location[facility] ENDOSCOPY;  Service: Endoscopy;  Laterality: N/A;   COLONOSCOPY WITH PROPOFOL N/A 10/23/2017   Procedure: COLONOSCOPY WITH PROPOFOL;  Surgeon: Lucilla Lame, MD;  Location: [facility] ENDOSCOPY;  Service: Endoscopy;  Laterality: N/A;   ESOPHAGOGASTRODUODENOSCOPY N/A 10/24/2017   Procedure: ESOPHAGOGASTRODUODENOSCOPY (EGD);  Surgeon: Virgel Manifold, MD;  Location: [facility] ENDOSCOPY;  Service: Endoscopy;  Laterality: N/A;   INSERT / REPLACE / REMOVE PACEMAKER      MEDICATIONS:  Prior to  Admission medications   Medication Sig Start Date End Date Taking? Authorizing Provider  acetaminophen (TYLENOL) 500 MG tablet Take 500 mg by mouth at bedtime as needed for moderate pain.    [provider]  aspirin 81 MG chewable tablet Chew 81 mg by mouth daily.    [provider]  Cholecalciferol (VITAMIN D3 PO) Take 1 tablet by mouth daily.    [provider]  diclofenac Sodium (VOLTAREN) 1 % GEL Apply 2 g topically 4 (four) times daily. 11/27/20   Sable Feil, PA-C  docusate sodium (COLACE) 100 MG capsule Take 100-200 mg by mouth 2 (two) times daily.     [provider]  furosemide (LASIX) 20 MG tablet Take 20 mg by mouth daily as needed for fluid.     [provider]  LACTASE ENZYME PO Take 1 tablet by mouth as needed (for indigestion).     [provider]  omega-3 acid ethyl esters (LOVAZA) 1 g capsule Take 1-2 g by mouth 2 (two) times daily.    [provider]  RABEprazole (ACIPHEX) 20 MG tablet Take 20 mg by mouth daily.     [provider]  SENNA LEAVES PO Take 1 capsule by mouth 2 (two) times a day.     [provider]  tetrahydrozoline-zinc (VISINE-AC) 0.05-0.25 % ophthalmic solution Place 1 drop into both eyes 3 (three) times daily as needed.    [provider]  traMADol (ULTRAM) 50 MG  tablet Take 1 tablet (50 mg total) by mouth every 6 (six) hours as needed for moderate pain. 10/26/18   Demetrios Loll, MD  vitamin B-12 (CYANOCOBALAMIN) 1000 MCG tablet Take 1,000 mcg by mouth daily.    [provider]    Physical Exam   Triage Vital Signs: ED Triage Vitals  Enc Vitals Group     BP 06/09/21 0120 105/76     Pulse Rate 06/09/21 0120 62     Resp 06/09/21 0120 19     Temp 06/09/21 0120 98.1 F (36.7 C)     Temp Source 06/09/21 0120 Oral     SpO2 06/09/21 0120 100 %     Weight 06/09/21 0122 170 lb (77.1 kg)     Height 06/09/21 0122 5\' 11"  (1.803 m)     Head Circumference --       Peak Flow --      Pain Score 06/09/21 0137 0     Pain Loc --      Pain Edu? --      Excl. in [place]? --     Most recent vital signs: Vitals:   06/09/21 0120 06/09/21 0328  BP: 105/76 (!) 141/91  Pulse: 62 69  Resp: 19 16  Temp: 98.1 F (36.7 C)   SpO2: 100% 98%     CONSTITUTIONAL: Alert and oriented and responds appropriately to questions. Well-appearing; well-nourished; GCS 60, very hard of hearing, elderly HEAD: Normocephalic; atraumatic EYES: Conjunctivae clear, PERRL, EOMI ENT: normal nose; no rhinorrhea; moist mucous membranes; pharynx without lesions noted; no dental injury; no septal hematoma, no epistaxis; no facial deformity or bony tenderness NECK: Supple, no midline spinal tenderness, step-off or deformity; trachea midline CARD: RRR; S1 and S2 appreciated; no murmurs, no clicks, no rubs, no gallops RESP: Normal chest excursion without splinting or tachypnea; breath sounds clear and equal bilaterally; no wheezes, no rhonchi, no rales; no hypoxia or respiratory distress CHEST:  chest wall stable, no crepitus or ecchymosis or deformity, nontender to palpation; no flail chest ABD/GI: Normal bowel sounds; non-distended; soft, non-tender, no rebound, no guarding; no ecchymosis or other lesions noted PELVIS:  stable, nontender to palpation BACK:  The back appears normal; no midline spinal tenderness, step-off or deformity EXT: He has an approximately 5 cm laceration to the proximal right fifth toe to the sole of the foot.  Tendon appears to be intact but wound is deep and extends around to almost the front of the toe on both sides and the toe itself feels very unstable when manipulated suggesting an acute fracture.  He does have limited range of motion in his toes but this is symmetric in both feet and I suspect chronic.  Slightly diminished capillary refill.  I do not appreciate any bony involvement or foreign body.  Both of his feet are cool to touch but I am able to Doppler 2+ DP and  PT pulses bilaterally.  He has some soft tissue swelling and bruising noted to the right first and fifth toes.  No other bony abnormality, ecchymosis appreciated.  Compartments soft.  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 SKIN: Normal color for age and race; warm NEURO: No facial asymmetry, normal speech, moving all extremities equally, reports normal sensation other than in his feet bilaterally from chronic neuropathy, no drift, cranial nerves II through XII intact  ED Results / Procedures / Treatments   LABS: (all labs ordered are listed, but only abnormal results are displayed) Labs Reviewed  BASIC METABOLIC PANEL -  Abnormal; Notable for the following components:      Result Value   Potassium 5.2 (*)    Chloride 113 (*)    Glucose, Bld 117 (*)    BUN 28 (*)    Creatinine, Ser 1.71 (*)    GFR, Estimated 38 (*)    All other components within normal limits  CBC - Abnormal; Notable for the following components:   RBC 3.13 (*)    Hemoglobin 10.6 (*)    HCT 32.8 (*)    MCV 104.8 (*)    Platelets 147 (*)    All other components within normal limits  PROTIME-INR  URINALYSIS, ROUTINE W REFLEX MICROSCOPIC     EKG:  EKG Interpretation  Date/Time:  Thursday June 09 2021 01:34:24 EST Ventricular Rate:  70 PR Interval:    QRS Duration: 154 QT Interval:  470 QTC Calculation: 507 R Axis:   -69 Text Interpretation: Ventricular-paced rhythm with occasional Premature ventricular complexes Abnormal ECG When compared with ECG of 27-Mar-2018 14:13, Premature ventricular complexes are now Present Vent. rate has increased BY   3 BPM Confirmed by Pryor Curia [phone]) on 06/09/2021 5:59:07 AM          RADIOLOGY: My personal review and interpretation: CT head shows a small left-sided subdural hematoma.  CT cervical spine shows no fracture.  X-ray of the right foot shows a first toe fracture that is nondisplaced.  Radiologist reports reviewed by myself:   CT HEAD WO CONTRAST  (5MM)  Result Date: 06/09/2021 CLINICAL DATA:  Head trauma, moderate-severe; Neck trauma (Age >= 65y). Fall with head injury EXAM: CT HEAD WITHOUT CONTRAST CT CERVICAL SPINE WITHOUT CONTRAST TECHNIQUE: Multidetector CT imaging of the head and cervical spine was performed following the standard protocol without intravenous contrast. Multiplanar CT image reconstructions of the cervical spine were also generated. COMPARISON:  None. FINDINGS: CT HEAD FINDINGS Brain: A a small subdural hematoma seen along the left frontal convexity measuring up to 10 mm in thickness on coronal image # 23/4. There is mild mass effect upon the left frontal cortex. No midline shift. No significant effacement of the left lateral ventricle. Parenchymal volume loss is commensurate with the patient's age. Moderate subcortical and periventricular white matter changes are present likely reflecting sequela of small vessel ischemia. Remote lacunar infarct noted within the right thalamus and left periventricular white matter. No acute infarct. Ventricular size is normal. Cerebellum is unremarkable. Vascular: Moderate atherosclerotic calcification within the carotid siphons and terminal left vertebral artery. No asymmetric hyperdense vasculature at the skull base. Skull: Intact Sinuses/Orbits: No acute finding. Other: Mastoid air cells and middle ear cavities are clear. CT CERVICAL SPINE FINDINGS Alignment: 2-3 mm anterolisthesis of C2-3 C3-4 and C4-5 is likely degenerative in nature. Skull base and vertebrae: Craniocervical alignment is normal. The atlantodental interval is not widened. No acute fracture of the cervical spine. Vertebral body height is preserved. Soft tissues and spinal canal: No prevertebral fluid or swelling. No visible canal hematoma. Disc levels: There is diffuse intervertebral disc space narrowing and endplate remodeling throughout the cervical spine in keeping with changes of advanced degenerative disc disease, most severe at  C5-C7. Prevertebral soft tissues are not thickened. Multilevel uncovertebral and facet arthrosis results in multilevel moderate to severe neuroforaminal narrowing, most severe C3-4 and C5-6 Upper chest: Unremarkable Other: None IMPRESSION: Small left frontal subdural hematoma with mild mass effect upon the left frontal cortex. No midline shift. No calvarial fracture. No acute fracture or listhesis of the cervical spine. These  results were called by telephone at the time of interpretation on 06/09/2021 at 3:02 am to provider [facility] , who verbally acknowledged these results. Electronically Signed   By: Fidela Salisbury M.D.   On: 06/09/2021 03:06   CT Cervical Spine Wo Contrast  Result Date: 06/09/2021 CLINICAL DATA:  Head trauma, moderate-severe; Neck trauma (Age >= 65y). Fall with head injury EXAM: CT HEAD WITHOUT CONTRAST CT CERVICAL SPINE WITHOUT CONTRAST TECHNIQUE: Multidetector CT imaging of the head and cervical spine was performed following the standard protocol without intravenous contrast. Multiplanar CT image reconstructions of the cervical spine were also generated. COMPARISON:  None. FINDINGS: CT HEAD FINDINGS Brain: A a small subdural hematoma seen along the left frontal convexity measuring up to 10 mm in thickness on coronal image # 23/4. There is mild mass effect upon the left frontal cortex. No midline shift. No significant effacement of the left lateral ventricle. Parenchymal volume loss is commensurate with the patient's age. Moderate subcortical and periventricular white matter changes are present likely reflecting sequela of small vessel ischemia. Remote lacunar infarct noted within the right thalamus and left periventricular white matter. No acute infarct. Ventricular size is normal. Cerebellum is unremarkable. Vascular: Moderate atherosclerotic calcification within the carotid siphons and terminal left vertebral artery. No asymmetric hyperdense vasculature at the skull base. Skull: Intact  Sinuses/Orbits: No acute finding. Other: Mastoid air cells and middle ear cavities are clear. CT CERVICAL SPINE FINDINGS Alignment: 2-3 mm anterolisthesis of C2-3 C3-4 and C4-5 is likely degenerative in nature. Skull base and vertebrae: Craniocervical alignment is normal. The atlantodental interval is not widened. No acute fracture of the cervical spine. Vertebral body height is preserved. Soft tissues and spinal canal: No prevertebral fluid or swelling. No visible canal hematoma. Disc levels: There is diffuse intervertebral disc space narrowing and endplate remodeling throughout the cervical spine in keeping with changes of advanced degenerative disc disease, most severe at C5-C7. Prevertebral soft tissues are not thickened. Multilevel uncovertebral and facet arthrosis results in multilevel moderate to severe neuroforaminal narrowing, most severe C3-4 and C5-6 Upper chest: Unremarkable Other: None IMPRESSION: Small left frontal subdural hematoma with mild mass effect upon the left frontal cortex. No midline shift. No calvarial fracture. No acute fracture or listhesis of the cervical spine. These results were called by telephone at the time of interpretation on 06/09/2021 at 3:02 am to provider [facility] , who verbally acknowledged these results. Electronically Signed   By: Fidela Salisbury M.D.   On: 06/09/2021 03:06   DG Foot Complete Right  Result Date: 06/09/2021 CLINICAL DATA:  Fall and trauma to the right foot. EXAM: RIGHT FOOT COMPLETE - 3+ VIEW COMPARISON:  None. FINDINGS: Nondisplaced fracture of the proximal phalanx of the great toe. Age indeterminate fracture of the proximal phalanx of the fifth digit. Correlation with clinical exam recommended. No dislocation. The bones are osteopenic. Vascular calcifications noted. There is soft tissue swelling over the dorsum of the foot and fifth digit which may represent ulcer. No radiopaque foreign object or soft tissue gas. IMPRESSION: 1. Nondisplaced fracture of  the proximal phalanx of the great toe. 2. Age indeterminate fracture of the proximal phalanx of the fifth digit. Electronically Signed   By: Anner Crete M.D.   On: 06/09/2021 02:00     PROCEDURES:  Critical Care performed: Yes, see critical care procedure note(s)   CRITICAL CARE Performed by: Pryor Curia   Total critical care time: 60 minutes  Critical care time was exclusive of separately billable  procedures and treating other patients.  Critical care was necessary to treat or prevent imminent or life-threatening deterioration.  Critical care was time spent personally by me on the following activities: development of treatment plan with patient and/or surrogate as well as nursing, discussions with consultants, evaluation of patient's response to treatment, examination of patient, obtaining history from patient or surrogate, ordering and performing treatments and interventions, ordering and review of laboratory studies, ordering and review of radiographic studies, pulse oximetry and re-evaluation of patient's condition.   Marland Kitchen.Laceration Repair  Date/Time: 06/09/2021 7:00 AM Performed by: Frederick Marro, Delice Bison, DO Authorized by: Daralyn Bert, Delice Bison, DO   Consent:    Consent obtained:  Verbal   Consent given by:  Patient   Risks, benefits, and alternatives were discussed: yes     Risks discussed:  Infection, pain, tendon damage, need for additional repair, poor wound healing, nerve damage and vascular damage   Alternatives discussed:  Referral and delayed treatment Universal protocol:    Procedure explained and questions answered to patient or proxy's satisfaction: yes     Relevant documents present and verified: yes     Test results available: yes     Imaging studies available: yes     Required blood products, implants, devices, and special equipment available: yes     Site/side marked: yes     Immediately prior to procedure, a time out was called: yes     Patient identity confirmed:   Verbally with patient Laceration details:    Location:  Foot   Foot location:  Sole of R foot   Length (cm):  5   Depth (mm):  5 Pre-procedure details:    Preparation:  Patient was prepped and draped in usual sterile fashion and imaging obtained to evaluate for foreign bodies Exploration:    Limited defect created (wound extended): no     Hemostasis achieved with:  Direct pressure   Imaging obtained: x-ray     Imaging outcome: foreign body not noted     Wound exploration: wound explored through full range of motion and entire depth of wound visualized     Wound extent: muscle damage and underlying fracture     Wound extent: no foreign bodies/material noted and no tendon damage noted     Contaminated: no   Treatment:    Area cleansed with:  Povidone-iodine   Amount of cleaning:  Extensive   Irrigation solution:  Sterile saline   Irrigation volume:  1 L   Irrigation method:  Syringe   Visualized foreign bodies/material removed: no     Debridement:  None   Undermining:  None   Scar revision: no   Skin repair:    Repair method:  Sutures   Suture size:  4-0   Wound skin closure material used: Vicryl.   Suture technique:  Simple interrupted   Number of sutures:  8 Approximation:    Approximation:  Close Repair type:    Repair type:  Intermediate Post-procedure details:    Dressing:  Antibiotic ointment and non-adherent dressing   Procedure completion:  Tolerated well, no immediate complications    IMPRESSION / MDM / ASSESSMENT AND PLAN / ED COURSE  I reviewed the triage vital signs and the nursing notes.  Patient here after mechanical fall.  Did hit his head but did not lose consciousness.  Has right foot injury with laceration.     DIFFERENTIAL DIAGNOSIS (includes but not limited to):   Intracranial hemorrhage, CVA, foot fracture, less likely ACS,  arrhythmia, infection, UTI, dehydration   PLAN: We will obtain CBC, BMP, urinalysis to rule out any organic causes for his  fall although he states he thinks he tripped.  We will update his tetanus vaccine.  Will obtain x-ray of the right foot and CT of the head and cervical spine.  He declines any medication for pain at this time.  He will need repair of the laceration to the bottom of his foot.   MEDICATIONS GIVEN IN ED: Medications  lidocaine (PF) (XYLOCAINE) 1 % injection 5 mL (has no administration in time range)  cephALEXin (KEFLEX) capsule 500 mg (has no administration in time range)  bacitracin ointment (has no administration in time range)  Tdap (BOOSTRIX) injection 0.5 mL (0.5 mLs Intramuscular Given 06/09/21 0452)  sodium chloride 0.9 % bolus 500 mL (500 mLs Intravenous New Bag/Given 06/09/21 0452)     ED COURSE: X-rays reviewed by myself and radiologist and shows a nondisplaced right great toe fracture.  He also has what looks like a possible old fracture to the right fifth toe.  The laceration that he has appears deep but tendon appears intact.  Given depth and neuropathy we discussed that he would be high risk for infection and there is also possibility that this could be an open fracture. He will need outpatient orthopedic follow-up.  We will clean this wound and repair.  We will start him on antibiotics.  CT of his head reviewed by myself and radiologist shows of the left sided subdural with mass-effect but no midline shift.  He appears to be at his neurologic baseline.  Family reports he is on aspirin but not other antiplatelets or anticoagulants.  Will discuss with neurosurgery.  Labs appear stable.  He has chronic anemia and chronic kidney disease.  Minimally elevated potassium of 5.2.  Given small amount of IV hydration here for this.  EKG shows no changes.  He is in a paced rhythm.  6:50 AM  Pt's laceration was repaired after copious saline irrigation.  Flexor tendon appears to be intact but again laceration is deep and extensive and I am worried that this is an open acute fracture of the fifth toe.   We will start him on antibiotics but have recommended close orthopedic follow-up.  We will place him in a postop shoe.  Continues to be neurologically intact and hemodynamically stable.  I reviewed all nursing notes and pertinent previous records as available.  I have reviewed and interpreted any EKGs, lab and urine results, imaging (as available).   7:30 AM  Signed out to oncoming ED physician to follow-up on repeat head CT and reassess patient.  CONSULTS:  Consulted and discussed patient's case with neurosurgeon on-call, Dr. Izora Ribas.  Consulting physician recommends repeat head CT in 6 hours.  If stable, patient can be discharged with outpatient neurosurgery follow-up.Marland Kitchen  Also sent a secure message to Dr. Posey Pronto with orthopedics to ensure close outpatient follow-up given I am concerned that patient may have an open fracture to his right fifth toe.  He was given antibiotics in the emergency department and wound was irrigated copiously and repaired.  Discussed at length wound care instructions and return precautions with patient and daughter.  Patient may require admission to the hospital if head CT worsening at all or he has any changes in his neurologic condition.   OUTSIDE RECORDS REVIEWED: Reviewed patient's recent hematology and cardiology notes.         FINAL CLINICAL IMPRESSION(S) / ED DIAGNOSES  Final diagnoses:  Fall  SDH (subdural hematoma)  Open fracture of phalanx of right fifth toe, initial encounter  Closed nondisplaced fracture of proximal phalanx of right great toe, initial encounter     Rx / DC Orders   ED Discharge Orders          Ordered    cephALEXin (KEFLEX) 500 MG capsule  4 times daily,   Status:  Discontinued        06/09/21 0735    cephALEXin (KEFLEX) 500 MG capsule  4 times daily        06/09/21 9447             Note:  This document was prepared using Dragon voice recognition software and may include unintentional dictation errors.    Lacee Grey, Delice Bison, DO 06/09/21 [place], Delice Bison, DO 06/09/21 [id number]

## 2021-06-09 NOTE — ED Triage Notes (Addendum)
Pt to ED from home via [facility].  Pt states he tripped and fell hitting right foot on doorway.  Pt has wound to right pinkie toe.  Bleeding controlled, dressing from EMS in place. Pt states he did hit his head, denies LOC.

## 2021-06-21 ENCOUNTER — Other Ambulatory Visit: Payer: Self-pay | Admitting: *Deleted

## 2021-06-21 DIAGNOSIS — D509 Iron deficiency anemia, unspecified: Secondary | ICD-10-CM

## 2021-06-22 ENCOUNTER — Telehealth: Payer: Self-pay | Admitting: Oncology

## 2021-06-22 NOTE — Telephone Encounter (Signed)
Daughter called to cancel pt's appt for 1-23. Will call back if he gets to feeling bad. Feeling good now.

## 2021-06-27 ENCOUNTER — Other Ambulatory Visit: Payer: Medicare Other

## 2021-06-27 ENCOUNTER — Ambulatory Visit: Payer: Medicare Other

## 2021-06-27 ENCOUNTER — Ambulatory Visit: Payer: Medicare Other | Admitting: Oncology

## 2023-02-22 ENCOUNTER — Encounter: Payer: Self-pay | Admitting: Oncology

## 2023-02-22 ENCOUNTER — Inpatient Hospital Stay (HOSPITAL_COMMUNITY)
Admission: EM | Admit: 2023-02-22 | Discharge: 2023-02-26 | DRG: 065 | Disposition: A | Discharge status: Hospice | Payer: Medicare Other | Attending: Internal Medicine | Admitting: Internal Medicine

## 2023-02-22 ENCOUNTER — Emergency Department (HOSPITAL_COMMUNITY): Payer: Medicare Other

## 2023-02-22 DIAGNOSIS — I130 Hypertensive heart and chronic kidney disease with heart failure and stage 1 through stage 4 chronic kidney disease, or unspecified chronic kidney disease: Secondary | ICD-10-CM | POA: Diagnosis not present

## 2023-02-22 DIAGNOSIS — I482 Chronic atrial fibrillation, unspecified: Secondary | ICD-10-CM | POA: Diagnosis not present

## 2023-02-22 DIAGNOSIS — N1832 Chronic kidney disease, stage 3b: Secondary | ICD-10-CM | POA: Diagnosis present

## 2023-02-22 DIAGNOSIS — N179 Acute kidney failure, unspecified: Secondary | ICD-10-CM | POA: Diagnosis present

## 2023-02-22 DIAGNOSIS — Z803 Family history of malignant neoplasm of breast: Secondary | ICD-10-CM

## 2023-02-22 DIAGNOSIS — J9611 Chronic respiratory failure with hypoxia: Secondary | ICD-10-CM | POA: Diagnosis present

## 2023-02-22 DIAGNOSIS — F0390 Unspecified dementia without behavioral disturbance: Secondary | ICD-10-CM | POA: Diagnosis present

## 2023-02-22 DIAGNOSIS — Z79899 Other long term (current) drug therapy: Secondary | ICD-10-CM

## 2023-02-22 DIAGNOSIS — I63411 Cerebral infarction due to embolism of right middle cerebral artery: Secondary | ICD-10-CM | POA: Diagnosis not present

## 2023-02-22 DIAGNOSIS — H518 Other specified disorders of binocular movement: Secondary | ICD-10-CM | POA: Diagnosis present

## 2023-02-22 DIAGNOSIS — Z515 Encounter for palliative care: Secondary | ICD-10-CM

## 2023-02-22 DIAGNOSIS — Z7982 Long term (current) use of aspirin: Secondary | ICD-10-CM

## 2023-02-22 DIAGNOSIS — Z66 Do not resuscitate: Secondary | ICD-10-CM | POA: Diagnosis not present

## 2023-02-22 DIAGNOSIS — R531 Weakness: Secondary | ICD-10-CM

## 2023-02-22 DIAGNOSIS — I495 Sick sinus syndrome: Secondary | ICD-10-CM | POA: Diagnosis not present

## 2023-02-22 DIAGNOSIS — G8104 Flaccid hemiplegia affecting left nondominant side: Secondary | ICD-10-CM | POA: Diagnosis not present

## 2023-02-22 DIAGNOSIS — I10 Essential (primary) hypertension: Secondary | ICD-10-CM | POA: Diagnosis present

## 2023-02-22 DIAGNOSIS — Z8249 Family history of ischemic heart disease and other diseases of the circulatory system: Secondary | ICD-10-CM

## 2023-02-22 DIAGNOSIS — N3289 Other specified disorders of bladder: Secondary | ICD-10-CM | POA: Diagnosis present

## 2023-02-22 DIAGNOSIS — Z87820 Personal history of traumatic brain injury: Secondary | ICD-10-CM

## 2023-02-22 DIAGNOSIS — N136 Pyonephrosis: Secondary | ICD-10-CM | POA: Diagnosis present

## 2023-02-22 DIAGNOSIS — R29718 NIHSS score 18: Secondary | ICD-10-CM | POA: Diagnosis present

## 2023-02-22 DIAGNOSIS — R1312 Dysphagia, oropharyngeal phase: Secondary | ICD-10-CM | POA: Diagnosis present

## 2023-02-22 DIAGNOSIS — D689 Coagulation defect, unspecified: Secondary | ICD-10-CM | POA: Diagnosis not present

## 2023-02-22 DIAGNOSIS — R414 Neurologic neglect syndrome: Secondary | ICD-10-CM | POA: Diagnosis present

## 2023-02-22 DIAGNOSIS — R0902 Hypoxemia: Secondary | ICD-10-CM | POA: Diagnosis present

## 2023-02-22 DIAGNOSIS — Z950 Presence of cardiac pacemaker: Secondary | ICD-10-CM

## 2023-02-22 DIAGNOSIS — Z888 Allergy status to other drugs, medicaments and biological substances status: Secondary | ICD-10-CM

## 2023-02-22 DIAGNOSIS — Z8042 Family history of malignant neoplasm of prostate: Secondary | ICD-10-CM

## 2023-02-22 DIAGNOSIS — D631 Anemia in chronic kidney disease: Secondary | ICD-10-CM | POA: Diagnosis present

## 2023-02-22 DIAGNOSIS — E872 Acidosis, unspecified: Secondary | ICD-10-CM | POA: Diagnosis not present

## 2023-02-22 DIAGNOSIS — I5032 Chronic diastolic (congestive) heart failure: Secondary | ICD-10-CM | POA: Diagnosis not present

## 2023-02-22 DIAGNOSIS — N184 Chronic kidney disease, stage 4 (severe): Secondary | ICD-10-CM | POA: Diagnosis present

## 2023-02-22 DIAGNOSIS — R29810 Facial weakness: Secondary | ICD-10-CM | POA: Diagnosis present

## 2023-02-22 DIAGNOSIS — Z86718 Personal history of other venous thrombosis and embolism: Secondary | ICD-10-CM

## 2023-02-22 DIAGNOSIS — I639 Cerebral infarction, unspecified: Principal | ICD-10-CM

## 2023-02-22 DIAGNOSIS — Z9049 Acquired absence of other specified parts of digestive tract: Secondary | ICD-10-CM

## 2023-02-22 DIAGNOSIS — R471 Dysarthria and anarthria: Secondary | ICD-10-CM | POA: Diagnosis present

## 2023-02-22 DIAGNOSIS — Z8619 Personal history of other infectious and parasitic diseases: Secondary | ICD-10-CM

## 2023-02-22 DIAGNOSIS — Z85820 Personal history of malignant melanoma of skin: Secondary | ICD-10-CM

## 2023-02-22 DIAGNOSIS — N183 Chronic kidney disease, stage 3 unspecified: Secondary | ICD-10-CM | POA: Diagnosis present

## 2023-02-22 DIAGNOSIS — Z8719 Personal history of other diseases of the digestive system: Secondary | ICD-10-CM

## 2023-02-22 DIAGNOSIS — E875 Hyperkalemia: Secondary | ICD-10-CM | POA: Diagnosis present

## 2023-02-22 DIAGNOSIS — I4891 Unspecified atrial fibrillation: Secondary | ICD-10-CM | POA: Diagnosis present

## 2023-02-22 LAB — DIFFERENTIAL
Abs Immature Granulocytes: 0.08 10*3/uL — ABNORMAL HIGH (ref 0.00–0.07)
Basophils Absolute: 0 10*3/uL (ref 0.0–0.1)
Basophils Relative: 0 %
Eosinophils Absolute: 0.3 10*3/uL (ref 0.0–0.5)
Eosinophils Relative: 2 %
Immature Granulocytes: 1 %
Lymphocytes Relative: 13 %
Lymphs Abs: 1.6 10*3/uL (ref 0.7–4.0)
Monocytes Absolute: 1.4 10*3/uL — ABNORMAL HIGH (ref 0.1–1.0)
Monocytes Relative: 11 %
Neutro Abs: 8.9 10*3/uL — ABNORMAL HIGH (ref 1.7–7.7)
Neutrophils Relative %: 73 %

## 2023-02-22 LAB — I-STAT CHEM 8, ED
BUN: 56 mg/dL — ABNORMAL HIGH (ref 8–23)
Calcium, Ion: 1.13 mmol/L — ABNORMAL LOW (ref 1.15–1.40)
Chloride: 116 mmol/L — ABNORMAL HIGH (ref 98–111)
Creatinine, Ser: 4.7 mg/dL — ABNORMAL HIGH (ref 0.61–1.24)
Glucose, Bld: 101 mg/dL — ABNORMAL HIGH (ref 70–99)
HCT: 27 % — ABNORMAL LOW (ref 39.0–52.0)
Hemoglobin: 9.2 g/dL — ABNORMAL LOW (ref 13.0–17.0)
Potassium: 5.4 mmol/L — ABNORMAL HIGH (ref 3.5–5.1)
Sodium: 145 mmol/L (ref 135–145)
TCO2: 18 mmol/L — ABNORMAL LOW (ref 22–32)

## 2023-02-22 LAB — CBC
HCT: 28.8 % — ABNORMAL LOW (ref 39.0–52.0)
Hemoglobin: 9.2 g/dL — ABNORMAL LOW (ref 13.0–17.0)
MCH: 34.2 pg — ABNORMAL HIGH (ref 26.0–34.0)
MCHC: 31.9 g/dL (ref 30.0–36.0)
MCV: 107.1 fL — ABNORMAL HIGH (ref 80.0–100.0)
Platelets: 172 10*3/uL (ref 150–400)
RBC: 2.69 MIL/uL — ABNORMAL LOW (ref 4.22–5.81)
RDW: 15.1 % (ref 11.5–15.5)
WBC: 12.3 10*3/uL — ABNORMAL HIGH (ref 4.0–10.5)
nRBC: 0 % (ref 0.0–0.2)

## 2023-02-22 LAB — PROTIME-INR
INR: 1.3 — ABNORMAL HIGH (ref 0.8–1.2)
Prothrombin Time: 16 seconds — ABNORMAL HIGH (ref 11.4–15.2)

## 2023-02-22 LAB — APTT: aPTT: 36 seconds (ref 24–36)

## 2023-02-22 MED ORDER — SODIUM CHLORIDE 0.9% FLUSH
3.0000 mL | Freq: Once | INTRAVENOUS | Status: AC
  Administered 2023-02-23: 3 mL via INTRAVENOUS

## 2023-02-22 NOTE — ED Provider Notes (Signed)
[facility] Emergency Department Provider Note MRN:  [id number]  Arrival date & time: 02/23/23     Chief Complaint   Code stroke History of Present Illness   Howard Harmon is a 89 y.o. year-old male with a history of A-fib presenting to the ED with chief complaint of code stroke.  Patient suddenly stopped acting normally at home.  Normally speech is very clear and fully conversant.  Suddenly became slurred, started ignoring left side of his visual space.  Caregiver called EMS.  Review of Systems  I was unable to obtain a full/accurate HPI, PMH, or ROS due to the patient's altered mental status.  Patient's Health History    Past Medical History:  Diagnosis Date   Arthritis    Atrial fibrillation (HCC)    Blood clotting tendency (HCC)    Chronic kidney disease    DVT (deep venous thrombosis) (HCC)    Hypertension    Melanoma (HCC)    Pacemaker     Past Surgical History:  Procedure Laterality Date   CHOLECYSTECTOMY     COLONOSCOPY WITH PROPOFOL N/A 10/15/2014   Procedure: COLONOSCOPY WITH PROPOFOL;  Surgeon: Elnita Maxwell, MD;  Location: [facility] ENDOSCOPY;  Service: Endoscopy;  Laterality: N/A;   COLONOSCOPY WITH PROPOFOL N/A 10/23/2017   Procedure: COLONOSCOPY WITH PROPOFOL;  Surgeon: Midge Minium, MD;  Location: [facility] ENDOSCOPY;  Service: Endoscopy;  Laterality: N/A;   ESOPHAGOGASTRODUODENOSCOPY N/A 10/24/2017   Procedure: ESOPHAGOGASTRODUODENOSCOPY (EGD);  Surgeon: Pasty Spillers, MD;  Location: [facility] ENDOSCOPY;  Service: Endoscopy;  Laterality: N/A;   INSERT / REPLACE / REMOVE PACEMAKER      Family History  Problem Relation Age of Onset   CAD Father    Prostate cancer Brother    Breast cancer Sister     Social History   Socioeconomic History   Marital status: Widowed    Spouse name: Not on file   Number of children: Not on file   Years of education: Not on file   Highest education level: Not on file  Occupational History   Not  on file  Tobacco Use   Smoking status: Never   Smokeless tobacco: Never  Vaping Use   Vaping status: Never Used  Substance and Sexual Activity   Alcohol use: No   Drug use: Never   Sexual activity: Not on file  Other Topics Concern   Not on file  Social History Narrative   Not on file   Social Determinants of Health   Financial Resource Strain: Not on file  Food Insecurity: Not on file  Transportation Needs: Not on file  Physical Activity: Not on file  Stress: Not on file  Social Connections: Not on file  Intimate Partner Violence: Not on file     Physical Exam   Vitals:   02/23/23 0100 02/23/23 0342  BP: (!) 154/84 (!) 161/92  Pulse: (!) 57 60  Resp: 13 20  Temp:  (!) 97 F (36.1 C)  SpO2: 99% 100%    CONSTITUTIONAL: Chronically ill-appearing, NAD NEURO/PSYCH: Somnolent, wakes to voice, oriented to name, left-sided neglect EYES:  eyes equal and reactive ENT/NECK:  no LAD, no JVD CARDIO: Regular rate, well-perfused, normal S1 and S2 PULM:  CTAB no wheezing or rhonchi GI/GU:  non-distended, non-tender MSK/SPINE:  No gross deformities, no edema SKIN:  no rash, atraumatic   *Additional and/or pertinent findings included in MDM below  Diagnostic and Interventional Summary    EKG Interpretation Date/Time:  Thursday February 22 2023 23:59:56 EDT Ventricular Rate:  69 PR Interval:  232 QRS Duration:  149 QT Interval:  469 QTC Calculation: 503 R Axis:   -78  Text Interpretation: Sinus rhythm Prolonged PR interval Nonspecific IVCD with LAD LVH with secondary repolarization abnormality Inferior infarct, old Probable anterior infarct, age indeterminate Confirmed by Kennis Carina [phone]) on 02/23/2023 12:01:53 AM       Labs Reviewed  PROTIME-INR - Abnormal; Notable for the following components:      Result Value   Prothrombin Time 16.0 (*)    INR 1.3 (*)    All other components within normal limits  CBC - Abnormal; Notable for the following components:    WBC 12.3 (*)    RBC 2.69 (*)    Hemoglobin 9.2 (*)    HCT 28.8 (*)    MCV 107.1 (*)    MCH 34.2 (*)    All other components within normal limits  DIFFERENTIAL - Abnormal; Notable for the following components:   Neutro Abs 8.9 (*)    Monocytes Absolute 1.4 (*)    Abs Immature Granulocytes 0.08 (*)    All other components within normal limits  COMPREHENSIVE METABOLIC PANEL - Abnormal; Notable for the following components:   Potassium 5.5 (*)    Chloride 113 (*)    CO2 18 (*)    Glucose, Bld 107 (*)    BUN 61 (*)    Creatinine, Ser 4.18 (*)    Calcium 8.7 (*)    Total Protein 6.2 (*)    Albumin 2.6 (*)    GFR, Estimated 13 (*)    All other components within normal limits  LIPID PANEL - Abnormal; Notable for the following components:   HDL 26 (*)    All other components within normal limits  CBC - Abnormal; Notable for the following components:   RBC 2.70 (*)    Hemoglobin 9.0 (*)    HCT 28.6 (*)    MCV 105.9 (*)    All other components within normal limits  I-STAT CHEM 8, ED - Abnormal; Notable for the following components:   Potassium 5.4 (*)    Chloride 116 (*)    BUN 56 (*)    Creatinine, Ser 4.70 (*)    Glucose, Bld 101 (*)    Calcium, Ion 1.13 (*)    TCO2 18 (*)    Hemoglobin 9.2 (*)    HCT 27.0 (*)    All other components within normal limits  APTT  ETHANOL  HEMOGLOBIN A1C  MAGNESIUM  URINALYSIS, COMPLETE (UACMP) WITH MICROSCOPIC  SODIUM, URINE, RANDOM  CREATININE, URINE, RANDOM  UREA NITROGEN, URINE  CBG MONITORING, ED    US RENAL  Final Result    CT HEAD CODE STROKE WO CONTRAST  Final Result    MR ANGIO HEAD WO CONTRAST    (Results Pending)  MR ANGIO NECK WO CONTRAST    (Results Pending)  MR BRAIN WO CONTRAST    (Results Pending)  CT HEAD WO CONTRAST ( )    (Results Pending)    Medications  aspirin chewable tablet 81 mg (has no administration in time range)   stroke: early stages of recovery book (has no administration in time range)   acetaminophen (TYLENOL) tablet 650 mg (has no administration in time range)    Or  acetaminophen (TYLENOL) 160 MG/5ML solution 650 mg (has no administration in time range)    Or  acetaminophen (TYLENOL) suppository 650 mg (has no administration in time range)  senna-docusate (Senokot-S)  tablet 1 tablet (has no administration in time range)  heparin injection 5,000 Units (5,000 Units Subcutaneous Given 02/23/23 0629)  sodium bicarbonate 150 mEq in sterile water 1,150 mL infusion ( Intravenous New Bag/Given 02/23/23 0131)  sodium chloride flush (NS) 0.9 % injection 3 mL (3 mLs Intravenous Given 02/23/23 0016)  sodium chloride 0.9 % bolus 500 mL (0 mLs Intravenous Stopped 02/23/23 0342)  aspirin suppository 300 mg (300 mg Rectal Given 02/23/23 0131)     Procedures  /  Critical Care .Critical Care  Performed by: Sabas Sous, MD Authorized by: Sabas Sous, MD   Critical care provider statement:    Critical care time (minutes):  35   Critical care was necessary to treat or prevent imminent or life-threatening deterioration of the following conditions:  CNS failure or compromise   Critical care was time spent personally by me on the following activities:  Development of treatment plan with patient or surrogate, discussions with consultants, evaluation of patient's response to treatment, examination of patient, ordering and review of laboratory studies, ordering and review of radiographic studies, ordering and performing treatments and interventions, pulse oximetry, re-evaluation of patient's condition and review of old charts   ED Course and Medical Decision Making  Initial Impression and Ddx Presentation concerning for acute ischemic stroke.  Code stroke initiated by EMS.  Past medical/surgical history that increases complexity of ED encounter: History of A-fib  Interpretation of Diagnostics I personally reviewed the EKG and my interpretation is as follows: Sinus rhythm  Labs reveal  worsening kidney function unclear chronicity, otherwise no significant blood count or electrolyte disturbance.  Patient Reassessment and Ultimate Disposition/Management     Admitted to medicine for further care of likely stroke, possible AKI.  Patient management required discussion with the following services or consulting groups:  Hospitalist Service and Neurology  Complexity of Problems Addressed Acute illness or injury that poses threat of life of bodily function  Additional Data Reviewed and Analyzed Further history obtained from: Further history from spouse/family member  Additional Factors Impacting ED Encounter Risk Consideration of hospitalization  Elmer Sow. Pilar Plate, MD [facility] Emergency Medicine [facility] [email or web address]  Final Clinical Impressions(s) / ED Diagnoses     ICD-10-CM   1. Acute ischemic stroke [facility])  I63.9       ED Discharge Orders     None        Discharge Instructions Discussed with and Provided to Patient:   Discharge Instructions   None      Sabas Sous, MD 02/23/23 [phone]

## 2023-02-22 NOTE — Consult Note (Addendum)
NEURO HOSPITALIST CONSULT NOTE   Requestig physician: Dr. Pilar Plate  Reason for Consult: Acute onset of left hemiplegia, slurred speech and right gaze deviation.   History obtained from:  EMS and Chart     HPI:                                                                                                                                          Howard Harmon is an 89 y.o. male with a PMHx of traumatic subdural hematoma in January, fall last week, atrial fibrillation, blood clotting tendency, CKD, DVT, HTN, melanoma and pacemaker placement who presents to the ED via EMS as a Code Stroke after acute onset of left sided weakness, slurred speech, decreased speech output, rightward gaze and leaning to the left. LKN 2215. Caregiver stated to EMS that he was fine and came back from the bathroom saying he was very tired. Patient then slumped to the left side, unable to move left arm, and had slurred speech. EMS states they smelled a strong smell of ammonia at his home, suspicious for a UTI, but his body did not smell strongly. Code Stroke was called in the field.   On arrival to the ED, he continued to have left sided weakness, dysarthria and right gaze preference. He was able to follow some simple commands and answer some orientation questions. BP 140/77, HR 59, RR 18, 100% on RA.   At baseline he ambulates with a walker with assistance. A caretaker helps him with multiple ADLs, including toileting, dressing and food preparation. His home ASA was stopped last week for an unknown reason.   Past Medical History:  Diagnosis Date   Arthritis    Atrial fibrillation (HCC)    Blood clotting tendency (HCC)    Chronic kidney disease    DVT (deep venous thrombosis) (HCC)    Hypertension    Melanoma (HCC)    Pacemaker     Past Surgical History:  Procedure Laterality Date   CHOLECYSTECTOMY     COLONOSCOPY WITH PROPOFOL N/A 10/15/2014   Procedure: COLONOSCOPY WITH PROPOFOL;  Surgeon:  Elnita Maxwell, MD;  Location: [facility] ENDOSCOPY;  Service: Endoscopy;  Laterality: N/A;   COLONOSCOPY WITH PROPOFOL N/A 10/23/2017   Procedure: COLONOSCOPY WITH PROPOFOL;  Surgeon: Midge Minium, MD;  Location: [facility] ENDOSCOPY;  Service: Endoscopy;  Laterality: N/A;   ESOPHAGOGASTRODUODENOSCOPY N/A 10/24/2017   Procedure: ESOPHAGOGASTRODUODENOSCOPY (EGD);  Surgeon: Pasty Spillers, MD;  Location: [facility] ENDOSCOPY;  Service: Endoscopy;  Laterality: N/A;   INSERT / REPLACE / REMOVE PACEMAKER      Family History  Problem Relation Age of Onset   CAD Father    Prostate cancer Brother    Breast cancer Sister  Social History:  reports that he has never smoked. He has never used smokeless tobacco. He reports that he does not drink alcohol and does not use drugs.  Allergies  Allergen Reactions   Warfarin Other (See Comments)    Bleeding diff to stop    HOME MEDICATIONS:                                                                                                                      No current facility-administered medications on file prior to encounter.   Current Outpatient Medications on File Prior to Encounter  Medication Sig Dispense Refill   acetaminophen (TYLENOL) 500 MG tablet Take 500 mg by mouth at bedtime as needed for moderate pain.     aspirin 81 MG chewable tablet Chew 81 mg by mouth daily.     Cholecalciferol (VITAMIN D3 PO) Take 1 tablet by mouth daily.     diclofenac Sodium (VOLTAREN) 1 % GEL Apply 2 g topically 4 (four) times daily. 100 g 0   docusate sodium (COLACE) 100 MG capsule Take 100-200 mg by mouth 2 (two) times daily.      furosemide (LASIX) 20 MG tablet Take 20 mg by mouth daily as needed for fluid.      LACTASE ENZYME PO Take 1 tablet by mouth as needed (for indigestion).      omega-3 acid ethyl esters (LOVAZA) 1 g capsule Take 1-2 g by mouth 2 (two) times daily.     RABEprazole (ACIPHEX) 20 MG tablet Take 20 mg by mouth daily.      SENNA  LEAVES PO Take 1 capsule by mouth 2 (two) times a day.      tetrahydrozoline-zinc (VISINE-AC) 0.05-0.25 % ophthalmic solution Place 1 drop into both eyes 3 (three) times daily as needed.     traMADol (ULTRAM) 50 MG tablet Take 1 tablet (50 mg total) by mouth every 6 (six) hours as needed for moderate pain. 16 tablet 0   vitamin B-12 (CYANOCOBALAMIN) 1000 MCG tablet Take 1,000 mcg by mouth daily.       ROS:                                                                                                                                       As per HPI. Detailed ROS deferred due to acuity of presentation.    Wt 76.2 kg   BMI 23.43 kg/m  General Examination:                                                                                                       Physical Exam  HEENT-  [place]/AT. No neck stiffness. Dry mucous membranes.   Lungs- Respirations unlabored Extremities- Pitting edema to BLE. Purplish discoloration in patches to arms bilaterally, worse on the right.   Neurological Examination Mental Status: Somnolent requiring frequent stimulation to be aroused to a drowsy state. Speech is dysarthric and sparse, with short answers to all questions varying in length from 1-4 words. Able to name his ear, nose and chin when touched, but has difficulty identifying objects by sight. Able to follow only simple commands.  Cranial Nerves: II: Does not blink to threat on right and left. PERRL  III,IV, VI: Gaze preference to the right. Can track an examiner's face briefly to the left, but gaze rapidly moves back to the right. No nystagmus.   V: Insensate on the left.  VII: Smile symmetric VIII: Hearing intact to voice IX,X: No hoarseness XI: Head preferentially rotated to the right.  XII: Dysarthric speech.  Motor/Sensory: RUE with no drift. 4/5 to resistance.  LUE 0/5 with flaccid tone RLE able to elevate antigravity without drift.  LLE drops immediately to bed with flaccid tone, but can  move slightly when noxious is applied to RIGHT leg.  Deep Tendon Reflexes: Hypoactive patellars.  Cerebellar: No ataxia with FNF on the right, but with slow movement.   Gait: Unable to assess  NIHSS: 18   Lab Results: Basic Metabolic Panel: No results for input(s): "NA", "K", "CL", "CO2", "GLUCOSE", "BUN", "CREATININE", "CALCIUM", "MG", "PHOS" in the last 168 hours.  CBC: No results for input(s): "WBC", "NEUTROABS", "HGB", "HCT", "MCV", "PLT" in the last 168 hours.  Cardiac Enzymes: No results for input(s): "CKTOTAL", "CKMB", "CKMBINDEX", "TROPONINI" in the last 168 hours.  Lipid Panel: No results for input(s): "CHOL", "TRIG", "HDL", "CHOLHDL", "VLDL", "LDLCALC" in the last 168 hours.  Imaging: No results found.   Assessment: 89 year old male presenting with acute onset of left hemiplegia, slurred speech and right gaze deviation - Exam reveals a frail-appearing and somnolent elderly male with flaccid left hemiplegia, right gaze preference, severe left hemisensory loss, left neglect and dysarthria. NIHSS 18.  - CT head: No evidence of acute infarction or hemorrhage. Periventricular white matter changes, likely the sequela of chronic small vessel ischemic disease. Previously noted left cerebral convexity subdural collection is no longer seen. Ex vacuo dilatation of the ventricles and widened cortical sulci, consistent with chronic atrophy. No hyperdense vessel. Atherosclerotic calcifications in the intracranial carotid and vertebral arteries. - Unable to obtain CTA due to elevated Cr with estimated GFR well below 30.  - Hypocalcemic and hyperkalemic. Na normal. Glucose 101. Hgb 9.2. Elevated WBC of 12.3. Platelets 172.  - Overall impression: Acute right MCA and ACA stroke.  - Not a TNK candidate due to history of intracranial hemorrhage. Not a thrombectomy candidate due to mRS of 4.   Recommendations: - Has a pacemaker. If compatible, will obtain MRI  brain with MRA of head and neck  (all without contrast) tomorrow. STAT imaging will not change management.  - Repeat CT head in 12 hours (ordered for 1 PM) - TTE - Cardiac telemetry - 300 mg rectal ASA now. Continue daily then switch to 81 mg po every day when passes swallow eval. Will need to review medical records to determine why ASA was stopped. Risks/benefits favor treating with ASA now in a closely monitored environment.  - Correction of electrolyte disturbance and AKI on CKD, per primary team.  - IVF - Workup for possible UTI.  - PT/OT/Speech - HgbA1c, fasting lipid panel - Hold off on statin due to advanced age and debility. Risks outweigh benefits.  - Frequent neuro checks - NPO until passes stroke swallow screen    Electronically signed: Dr. Caryl Pina 02/22/2023, 11:34 PM

## 2023-02-23 ENCOUNTER — Encounter (HOSPITAL_COMMUNITY): Payer: Self-pay | Admitting: Family Medicine

## 2023-02-23 ENCOUNTER — Inpatient Hospital Stay (HOSPITAL_COMMUNITY): Payer: Medicare Other

## 2023-02-23 ENCOUNTER — Other Ambulatory Visit: Payer: Self-pay

## 2023-02-23 DIAGNOSIS — I639 Cerebral infarction, unspecified: Secondary | ICD-10-CM

## 2023-02-23 DIAGNOSIS — R0902 Hypoxemia: Secondary | ICD-10-CM | POA: Diagnosis present

## 2023-02-23 DIAGNOSIS — Z66 Do not resuscitate: Secondary | ICD-10-CM | POA: Diagnosis not present

## 2023-02-23 DIAGNOSIS — R1312 Dysphagia, oropharyngeal phase: Secondary | ICD-10-CM | POA: Diagnosis present

## 2023-02-23 DIAGNOSIS — N184 Chronic kidney disease, stage 4 (severe): Secondary | ICD-10-CM | POA: Diagnosis present

## 2023-02-23 DIAGNOSIS — I4891 Unspecified atrial fibrillation: Secondary | ICD-10-CM

## 2023-02-23 DIAGNOSIS — D689 Coagulation defect, unspecified: Secondary | ICD-10-CM | POA: Diagnosis present

## 2023-02-23 DIAGNOSIS — I482 Chronic atrial fibrillation, unspecified: Secondary | ICD-10-CM

## 2023-02-23 DIAGNOSIS — J9611 Chronic respiratory failure with hypoxia: Secondary | ICD-10-CM | POA: Diagnosis not present

## 2023-02-23 DIAGNOSIS — E875 Hyperkalemia: Secondary | ICD-10-CM | POA: Diagnosis present

## 2023-02-23 DIAGNOSIS — R471 Dysarthria and anarthria: Secondary | ICD-10-CM | POA: Diagnosis present

## 2023-02-23 DIAGNOSIS — R531 Weakness: Secondary | ICD-10-CM

## 2023-02-23 DIAGNOSIS — N136 Pyonephrosis: Secondary | ICD-10-CM | POA: Diagnosis present

## 2023-02-23 DIAGNOSIS — H518 Other specified disorders of binocular movement: Secondary | ICD-10-CM | POA: Diagnosis present

## 2023-02-23 DIAGNOSIS — N1832 Chronic kidney disease, stage 3b: Secondary | ICD-10-CM

## 2023-02-23 DIAGNOSIS — I130 Hypertensive heart and chronic kidney disease with heart failure and stage 1 through stage 4 chronic kidney disease, or unspecified chronic kidney disease: Secondary | ICD-10-CM | POA: Diagnosis present

## 2023-02-23 DIAGNOSIS — I6389 Other cerebral infarction: Secondary | ICD-10-CM | POA: Diagnosis not present

## 2023-02-23 DIAGNOSIS — Z515 Encounter for palliative care: Secondary | ICD-10-CM | POA: Diagnosis not present

## 2023-02-23 DIAGNOSIS — R29810 Facial weakness: Secondary | ICD-10-CM | POA: Diagnosis present

## 2023-02-23 DIAGNOSIS — N179 Acute kidney failure, unspecified: Secondary | ICD-10-CM

## 2023-02-23 DIAGNOSIS — R414 Neurologic neglect syndrome: Secondary | ICD-10-CM | POA: Diagnosis present

## 2023-02-23 DIAGNOSIS — I495 Sick sinus syndrome: Secondary | ICD-10-CM | POA: Diagnosis present

## 2023-02-23 DIAGNOSIS — E872 Acidosis, unspecified: Secondary | ICD-10-CM | POA: Diagnosis present

## 2023-02-23 DIAGNOSIS — G8104 Flaccid hemiplegia affecting left nondominant side: Secondary | ICD-10-CM | POA: Diagnosis present

## 2023-02-23 DIAGNOSIS — F0390 Unspecified dementia without behavioral disturbance: Secondary | ICD-10-CM | POA: Diagnosis present

## 2023-02-23 DIAGNOSIS — I63411 Cerebral infarction due to embolism of right middle cerebral artery: Secondary | ICD-10-CM | POA: Diagnosis present

## 2023-02-23 DIAGNOSIS — R29718 NIHSS score 18: Secondary | ICD-10-CM | POA: Diagnosis present

## 2023-02-23 DIAGNOSIS — I5032 Chronic diastolic (congestive) heart failure: Secondary | ICD-10-CM | POA: Diagnosis present

## 2023-02-23 DIAGNOSIS — N170 Acute kidney failure with tubular necrosis: Secondary | ICD-10-CM | POA: Diagnosis not present

## 2023-02-23 DIAGNOSIS — Z7189 Other specified counseling: Secondary | ICD-10-CM | POA: Diagnosis not present

## 2023-02-23 DIAGNOSIS — I480 Paroxysmal atrial fibrillation: Secondary | ICD-10-CM | POA: Diagnosis not present

## 2023-02-23 DIAGNOSIS — D631 Anemia in chronic kidney disease: Secondary | ICD-10-CM | POA: Diagnosis present

## 2023-02-23 LAB — CBC
HCT: 28.6 % — ABNORMAL LOW (ref 39.0–52.0)
Hemoglobin: 9 g/dL — ABNORMAL LOW (ref 13.0–17.0)
MCH: 33.3 pg (ref 26.0–34.0)
MCHC: 31.5 g/dL (ref 30.0–36.0)
MCV: 105.9 fL — ABNORMAL HIGH (ref 80.0–100.0)
Platelets: 162 10*3/uL (ref 150–400)
RBC: 2.7 MIL/uL — ABNORMAL LOW (ref 4.22–5.81)
RDW: 15.1 % (ref 11.5–15.5)
WBC: 10.5 10*3/uL (ref 4.0–10.5)
nRBC: 0 % (ref 0.0–0.2)

## 2023-02-23 LAB — COMPREHENSIVE METABOLIC PANEL
ALT: 23 U/L (ref 0–44)
AST: 24 U/L (ref 15–41)
Albumin: 2.6 g/dL — ABNORMAL LOW (ref 3.5–5.0)
Alkaline Phosphatase: 62 U/L (ref 38–126)
Anion gap: 14 (ref 5–15)
BUN: 61 mg/dL — ABNORMAL HIGH (ref 8–23)
CO2: 18 mmol/L — ABNORMAL LOW (ref 22–32)
Calcium: 8.7 mg/dL — ABNORMAL LOW (ref 8.9–10.3)
Chloride: 113 mmol/L — ABNORMAL HIGH (ref 98–111)
Creatinine, Ser: 4.18 mg/dL — ABNORMAL HIGH (ref 0.61–1.24)
GFR, Estimated: 13 mL/min — ABNORMAL LOW (ref >60)
Glucose, Bld: 107 mg/dL — ABNORMAL HIGH (ref 70–99)
Potassium: 5.5 mmol/L — ABNORMAL HIGH (ref 3.5–5.1)
Sodium: 145 mmol/L (ref 135–145)
Total Bilirubin: 0.6 mg/dL (ref 0.3–1.2)
Total Protein: 6.2 g/dL — ABNORMAL LOW (ref 6.5–8.1)

## 2023-02-23 LAB — LIPID PANEL
Cholesterol: 84 mg/dL (ref 0–200)
HDL: 26 mg/dL — ABNORMAL LOW (ref >40)
LDL Cholesterol: 49 mg/dL (ref 0–99)
Total CHOL/HDL Ratio: 3.2 RATIO
Triglycerides: 44 mg/dL (ref <150)
VLDL: 9 mg/dL (ref 0–40)

## 2023-02-23 LAB — CBG MONITORING, ED: Glucose-Capillary: 98 mg/dL (ref 70–99)

## 2023-02-23 LAB — BASIC METABOLIC PANEL
Anion gap: 9 (ref 5–15)
Anion gap: 14 (ref 5–15)
BUN: 57 mg/dL — ABNORMAL HIGH (ref 8–23)
BUN: 56 mg/dL — ABNORMAL HIGH (ref 8–23)
CO2: 21 mmol/L — ABNORMAL LOW (ref 22–32)
CO2: 21 mmol/L — ABNORMAL LOW (ref 22–32)
Calcium: 8.1 mg/dL — ABNORMAL LOW (ref 8.9–10.3)
Calcium: 8.2 mg/dL — ABNORMAL LOW (ref 8.9–10.3)
Chloride: 113 mmol/L — ABNORMAL HIGH (ref 98–111)
Chloride: 109 mmol/L (ref 98–111)
Creatinine, Ser: 3.94 mg/dL — ABNORMAL HIGH (ref 0.61–1.24)
Creatinine, Ser: 3.81 mg/dL — ABNORMAL HIGH (ref 0.61–1.24)
GFR, Estimated: 14 mL/min — ABNORMAL LOW (ref >60)
GFR, Estimated: 14 mL/min — ABNORMAL LOW (ref >60)
Glucose, Bld: 87 mg/dL (ref 70–99)
Glucose, Bld: 90 mg/dL (ref 70–99)
Potassium: 5.2 mmol/L — ABNORMAL HIGH (ref 3.5–5.1)
Potassium: 5.6 mmol/L — ABNORMAL HIGH (ref 3.5–5.1)
Sodium: 143 mmol/L (ref 135–145)
Sodium: 144 mmol/L (ref 135–145)

## 2023-02-23 LAB — ECHOCARDIOGRAM COMPLETE
AR max vel: 1.08 cm2
AV Area VTI: 1.07 cm2
AV Area mean vel: 1.02 cm2
AV Mean grad: 26.5 mmHg
AV Peak grad: 48.2 mmHg
Ao pk vel: 3.47 m/s
S' Lateral: 3.5 cm
Weight: 2687.85 oz

## 2023-02-23 LAB — HEMOGLOBIN A1C
Hgb A1c MFr Bld: 5.6 % (ref 4.8–5.6)
Mean Plasma Glucose: 114.02 mg/dL

## 2023-02-23 LAB — MAGNESIUM: Magnesium: 2.2 mg/dL (ref 1.7–2.4)

## 2023-02-23 LAB — ETHANOL: Alcohol, Ethyl (B): <10 mg/dL (ref <10)

## 2023-02-23 MED ORDER — STERILE WATER FOR INJECTION IV SOLN
INTRAVENOUS | Status: DC
  Filled 2023-02-23 (×2): qty 1000

## 2023-02-23 MED ORDER — DEXTROSE-SODIUM CHLORIDE 5-0.45 % IV SOLN: INTRAVENOUS | Status: DC

## 2023-02-23 MED ORDER — ASPIRIN 81 MG PO CHEW
81.0000 mg | CHEWABLE_TABLET | Freq: Every day | ORAL | Status: DC
  Filled 2023-02-23: qty 1

## 2023-02-23 MED ORDER — ACETAMINOPHEN 650 MG RE SUPP: 650.0000 mg | RECTAL | Status: DC | PRN

## 2023-02-23 MED ORDER — ACETAMINOPHEN 160 MG/5ML PO SOLN: 650.0000 mg | ORAL | Status: DC | PRN

## 2023-02-23 MED ORDER — ACETAMINOPHEN 325 MG PO TABS: 650.0000 mg | ORAL_TABLET | ORAL | Status: DC | PRN

## 2023-02-23 MED ORDER — HEPARIN SODIUM (PORCINE) 5000 UNIT/ML IJ SOLN
5000.0000 [IU] | Freq: Three times a day (TID) | INTRAMUSCULAR | Status: DC
  Administered 2023-02-23 (×4): 5000 [IU] via SUBCUTANEOUS
  Filled 2023-02-23 (×4): qty 1

## 2023-02-23 MED ORDER — SENNOSIDES-DOCUSATE SODIUM 8.6-50 MG PO TABS: 1.0000 | ORAL_TABLET | Freq: Every evening | ORAL | Status: DC | PRN

## 2023-02-23 MED ORDER — ASPIRIN 300 MG RE SUPP: 300.0000 mg | Freq: Every day | RECTAL | Status: DC

## 2023-02-23 MED ORDER — SODIUM CHLORIDE 0.9 % IV BOLUS
500.0000 mL | Freq: Once | INTRAVENOUS | Status: AC
  Administered 2023-02-23: 500 mL via INTRAVENOUS

## 2023-02-23 MED ORDER — SODIUM CHLORIDE 0.9 % IV SOLN
1.0000 g | Freq: Once | INTRAVENOUS | Status: AC
  Filled 2023-02-23: qty 10

## 2023-02-23 MED ORDER — ASPIRIN 300 MG RE SUPP
300.0000 mg | Freq: Once | RECTAL | Status: AC
  Administered 2023-02-23: 300 mg via RECTAL
  Filled 2023-02-23: qty 1

## 2023-02-23 MED ORDER — SODIUM CHLORIDE 0.9 % IV SOLN: INTRAVENOUS | Status: DC

## 2023-02-23 MED ORDER — STROKE: EARLY STAGES OF RECOVERY BOOK
Freq: Once | Status: DC
  Filled 2023-02-23: qty 1

## 2023-02-23 MED ORDER — ASPIRIN 300 MG RE SUPP
300.0000 mg | Freq: Every day | RECTAL | Status: DC
  Filled 2023-02-23 (×2): qty 1

## 2023-02-23 NOTE — Consult Note (Signed)
H&P Physician requesting consult: Shonna Chock, MD  Chief Complaint: Urinary retention  History of Present Illness: 88 year old male admitted with concern for CVA.  He was noted to have acute renal insufficiency.  Renal ultrasound revealed severe bilateral hydronephrosis with a distended bladder.  Nursing was unable to place Foley catheter.  Daughter was at bedside.  States that patient has been complaining about some difficulty with urination thought to be secondary to enlarged prostate.  He is apparently not on any medications for his prostate.  Patient not able to participate in the interview.  Past Medical History:  Diagnosis Date   Arthritis    Atrial fibrillation (HCC)    Blood clotting tendency (HCC)    Chronic kidney disease    DVT (deep venous thrombosis) (HCC)    Hypertension    Melanoma (HCC)    Pacemaker    Past Surgical History:  Procedure Laterality Date   CHOLECYSTECTOMY     COLONOSCOPY WITH PROPOFOL N/A 10/15/2014   Procedure: COLONOSCOPY WITH PROPOFOL;  Surgeon: Elnita Maxwell, MD;  Location: [facility] ENDOSCOPY;  Service: Endoscopy;  Laterality: N/A;   COLONOSCOPY WITH PROPOFOL N/A 10/23/2017   Procedure: COLONOSCOPY WITH PROPOFOL;  Surgeon: Midge Minium, MD;  Location: [facility] ENDOSCOPY;  Service: Endoscopy;  Laterality: N/A;   ESOPHAGOGASTRODUODENOSCOPY N/A 10/24/2017   Procedure: ESOPHAGOGASTRODUODENOSCOPY (EGD);  Surgeon: Pasty Spillers, MD;  Location: [facility] ENDOSCOPY;  Service: Endoscopy;  Laterality: N/A;   INSERT / REPLACE / REMOVE PACEMAKER      Home Medications:  (Not in a hospital admission)  Allergies:  Allergies  Allergen Reactions   Warfarin Other (See Comments)    Bleeding diff to stop    Family History  Problem Relation Age of Onset   CAD Father    Prostate cancer Brother    Breast cancer Sister    Social History:  reports that he has never smoked. He has never used smokeless tobacco. He reports that he does not drink alcohol and does not use  drugs.  ROS: A complete review of systems was performed.  All systems are negative except for pertinent findings as noted. ROS   Physical Exam:  Vital signs in last 24 hours: Temp:  [97 F (36.1 C)-98.6 F (37 C)] 97.8 F (36.6 C) (09/20 1120) Pulse Rate:  [57-73] 59 (09/20 1120) Resp:  [13-20] 20 (09/20 1120) BP: (134-170)/(62-97) 170/95 (09/20 1120) SpO2:  [96 %-100 %] 100 % (09/20 1120) Weight:  [76.2 kg] 76.2 kg (09/19 2300) General: Alert but not obviously oriented.  Unable to participate in history Genitourinary: Patient is uncircumcised.  He has phimosis.  Unable to retract foreskin.  Laboratory Data:  Results for orders placed or performed during the hospital encounter of 02/22/23 (from the past 24 hour(s))  Protime-INR     Status: Abnormal   Collection Time: 02/22/23 11:34 PM  Result Value Ref Range   Prothrombin Time 16.0 (H) 11.4 - 15.2 seconds   INR 1.3 (H) 0.8 - 1.2  APTT     Status: None   Collection Time: 02/22/23 11:34 PM  Result Value Ref Range   aPTT 36 24 - 36 seconds  CBC     Status: Abnormal   Collection Time: 02/22/23 11:34 PM  Result Value Ref Range   WBC 12.3 (H) 4.0 - 10.5 K/uL   RBC 2.69 (L) 4.22 - 5.81 MIL/uL   Hemoglobin 9.2 (L) 13.0 - 17.0 g/dL   HCT 60.1 (L) 09.3 - 23.5 %   MCV 107.1 (H) 80.0 -  100.0 fL   MCH 34.2 (H) 26.0 - 34.0 pg   MCHC 31.9 30.0 - 36.0 g/dL   RDW 40.9 81.1 - 91.4 %   Platelets 172 150 - 400 K/uL   nRBC 0.0 0.0 - 0.2 %  Differential     Status: Abnormal   Collection Time: 02/22/23 11:34 PM  Result Value Ref Range   Neutrophils Relative % 73 %   Neutro Abs 8.9 (H) 1.7 - 7.7 K/uL   Lymphocytes Relative 13 %   Lymphs Abs 1.6 0.7 - 4.0 K/uL   Monocytes Relative 11 %   Monocytes Absolute 1.4 (H) 0.1 - 1.0 K/uL   Eosinophils Relative 2 %   Eosinophils Absolute 0.3 0.0 - 0.5 K/uL   Basophils Relative 0 %   Basophils Absolute 0.0 0.0 - 0.1 K/uL   Immature Granulocytes 1 %   Abs Immature Granulocytes 0.08 (H) 0.00 -  0.07 K/uL  Comprehensive metabolic panel     Status: Abnormal   Collection Time: 02/22/23 11:34 PM  Result Value Ref Range   Sodium 145 135 - 145 mmol/L   Potassium 5.5 (H) 3.5 - 5.1 mmol/L   Chloride 113 (H) 98 - 111 mmol/L   CO2 18 (L) 22 - 32 mmol/L   Glucose, Bld 107 (H) 70 - 99 mg/dL   BUN 61 (H) 8 - 23 mg/dL   Creatinine, Ser 7.82 (H) 0.61 - 1.24 mg/dL   Calcium 8.7 (L) 8.9 - 10.3 mg/dL   Total Protein 6.2 (L) 6.5 - 8.1 g/dL   Albumin 2.6 (L) 3.5 - 5.0 g/dL   AST 24 15 - 41 U/L   ALT 23 0 - 44 U/L   Alkaline Phosphatase 62 38 - 126 U/L   Total Bilirubin 0.6 0.3 - 1.2 mg/dL   GFR, Estimated 13 (L) >60 mL/min   Anion gap 14 5 - 15  I-stat chem 8, ED     Status: Abnormal   Collection Time: 02/22/23 11:36 PM  Result Value Ref Range   Sodium 145 135 - 145 mmol/L   Potassium 5.4 (H) 3.5 - 5.1 mmol/L   Chloride 116 (H) 98 - 111 mmol/L   BUN 56 (H) 8 - 23 mg/dL   Creatinine, Ser 9.56 (H) 0.61 - 1.24 mg/dL   Glucose, Bld 213 (H) 70 - 99 mg/dL   Calcium, Ion 0.86 (L) 1.15 - 1.40 mmol/L   TCO2 18 (L) 22 - 32 mmol/L   Hemoglobin 9.2 (L) 13.0 - 17.0 g/dL   HCT 57.8 (L) 46.9 - 62.9 %  Ethanol     Status: None   Collection Time: 02/22/23 11:51 PM  Result Value Ref Range   Alcohol, Ethyl (B) <10 <10 mg/dL  CBG monitoring, ED     Status: None   Collection Time: 02/23/23 12:15 AM  Result Value Ref Range   Glucose-Capillary 98 70 - 99 mg/dL  Lipid panel     Status: Abnormal   Collection Time: 02/23/23  3:04 AM  Result Value Ref Range   Cholesterol 84 0 - 200 mg/dL   Triglycerides 44 <528 mg/dL   HDL 26 (L) >41 mg/dL   Total CHOL/HDL Ratio 3.2 RATIO   VLDL 9 0 - 40 mg/dL   LDL Cholesterol 49 0 - 99 mg/dL  Hemoglobin L2G     Status: None   Collection Time: 02/23/23  3:04 AM  Result Value Ref Range   Hgb A1c MFr Bld 5.6 4.8 - 5.6 %   Mean Plasma  Glucose 114.02 mg/dL  CBC     Status: Abnormal   Collection Time: 02/23/23  3:04 AM  Result Value Ref Range   WBC 10.5 4.0 - 10.5  K/uL   RBC 2.70 (L) 4.22 - 5.81 MIL/uL   Hemoglobin 9.0 (L) 13.0 - 17.0 g/dL   HCT 84.1 (L) 32.4 - 40.1 %   MCV 105.9 (H) 80.0 - 100.0 fL   MCH 33.3 26.0 - 34.0 pg   MCHC 31.5 30.0 - 36.0 g/dL   RDW 02.7 25.3 - 66.4 %   Platelets 162 150 - 400 K/uL   nRBC 0.0 0.0 - 0.2 %  Magnesium     Status: None   Collection Time: 02/23/23  3:04 AM  Result Value Ref Range   Magnesium 2.2 1.7 - 2.4 mg/dL  Basic metabolic panel     Status: Abnormal   Collection Time: 02/23/23  9:24 AM  Result Value Ref Range   Sodium 143 135 - 145 mmol/L   Potassium 5.2 (H) 3.5 - 5.1 mmol/L   Chloride 113 (H) 98 - 111 mmol/L   CO2 21 (L) 22 - 32 mmol/L   Glucose, Bld 87 70 - 99 mg/dL   BUN 57 (H) 8 - 23 mg/dL   Creatinine, Ser 4.03 (H) 0.61 - 1.24 mg/dL   Calcium 8.1 (L) 8.9 - 10.3 mg/dL   GFR, Estimated 14 (L) >60 mL/min   Anion gap 9 5 - 15   No results found for this or any previous visit (from the past 240 hour(s)). Creatinine: Recent Labs    02/22/23 2334 02/22/23 2336 02/23/23 0924  CREATININE 4.18* 4.70* 3.94*   Procedure: Simple Foley catheter placement Penis was prepped and draped.  I was not able to place a 16 [place] latex catheter.  I was however able to ultimately advance an 69 French silicone catheter into the urethra and into the bladder.  There was some tightness at the meatus suggesting meatal stenosis.  I was not able to visualize the meatus due to his phimosis however.  There is no resistance after advancement past the fossa navicularis.  There was return of clear yellow urine.  I attached the catheter to his right leg.   Impression/Assessment:  Urinary retention Acute renal insufficiency Bilateral hydronephrosis likely secondary to urinary retention  Plan:  Maintain Foley catheter.  Can consider starting tamsulosin.  Would maintain catheter for at least 7 days prior to trying a void trial.  Patient should be more mobile prior to attempting voiding trial.  Otherwise can potentially  follow-up in clinic for a trial of depending on his progress and discharge planning.  Ray Church, III 02/23/2023, 3:32 PM

## 2023-02-23 NOTE — ED Notes (Signed)
Three nurses in the emergency room try to place foley was unsuccessful. The nurse from rehab came to place a coude cath and was unable to. Dr Ashok Pall was made aware. Foley was placed by urology.

## 2023-02-23 NOTE — Progress Notes (Addendum)
STROKE TEAM PROGRESS NOTE   BRIEF HPI [name] is a 89 y.o. 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   INTERIM HISTORY/SUBJECTIVE Going for CT head, family to discuss goals of care. Home hospice team took him of ASA a few weeks ago per daughters. Son is POA. No MRI due to PPM Neuro status is stable, able to work with PT and sit at the side of the bed with max assist.   OBJECTIVE  CBC    Component Value Date/Time   WBC 10.5 02/23/2023 0304   RBC 2.70 (L) 02/23/2023 0304   HGB 9.0 (L) 02/23/2023 0304   HGB 11.3 (L) 10/02/2012 1222   HCT 28.6 (L) 02/23/2023 0304   HCT 33.6 (L) 10/02/2012 1222   PLT 162 02/23/2023 0304   PLT 206 10/02/2012 1222   MCV 105.9 (H) 02/23/2023 0304   MCV 100 10/02/2012 1222   MCH 33.3 02/23/2023 0304   MCHC 31.5 02/23/2023 0304   RDW 15.1 02/23/2023 0304   RDW 15.1 (H) 10/02/2012 1222   LYMPHSABS 1.6 02/22/2023 2334   LYMPHSABS 1.0 09/18/2012 0636   MONOABS 1.4 (H) 02/22/2023 2334   MONOABS 1.1 (H) 09/18/2012 0636   EOSABS 0.3 02/22/2023 2334   EOSABS 0.2 09/18/2012 0636   BASOSABS 0.0 02/22/2023 2334   BASOSABS 0.0 09/18/2012 0636    BMET    Component Value Date/Time   NA 145 02/22/2023 2336   NA 144 09/18/2012 0636   K 5.4 (H) 02/22/2023 2336   K 3.9 09/18/2012 0636   CL 116 (H) 02/22/2023 2336   CL 112 (H) 09/18/2012 0636   CO2 18 (L) 02/22/2023 2334   CO2 22 09/18/2012 0636   GLUCOSE 101 (H) 02/22/2023 2336   GLUCOSE 137 (H) 09/18/2012 0636   BUN 56 (H) 02/22/2023 2336   BUN 32 (H) 09/18/2012 0636   CREATININE 4.70 (H) 02/22/2023 2336   CREATININE 2.09 (H) 09/18/2012 0636   CALCIUM 8.7 (L) 02/22/2023 2334   CALCIUM 8.7  09/18/2012 0636   GFRNONAA 13 (L) 02/22/2023 2334   GFRNONAA 29 (L) 09/18/2012 0636    IMAGING past 24 hours US RENAL  Result Date: 02/23/2023 CLINICAL DATA:  [id number] Acute renal failure superimposed on stage 3 chronic kidney disease, unspecified acute renal failure type, unspecified whether stage 3a or 3b CKD (HCC) [id number] EXAM: RENAL / URINARY TRACT ULTRASOUND COMPLETE COMPARISON:  None Available. FINDINGS: Right Kidney: Renal measurements: 10.2 x 6 x 4.2 cm = volume: 132 mL. Echogenicity within normal limits. No mass. Severe hydronephrosis visualized. Left Kidney: Renal measurements: 10.1 x 4.5 x 3.6 cm = volume: 85 mL. Echogenicity within normal limits. No mass. Severe hydronephrosis visualized. Urinary bladder: Debris within the urinary bladder lumen. Distended with urine. Otherwise appears normal for degree of bladder distention. Other: None. IMPRESSION: Urinary bladder lumen distended with urine with bilateral severe hydronephrosis. Electronically Signed   By: Tish Frederickson M.D.   On: 02/23/2023 03:01   CT HEAD CODE STROKE WO CONTRAST  Result Date: 02/22/2023 CLINICAL DATA:  Code stroke.  Stroke suspected EXAM: CT HEAD WITHOUT CONTRAST TECHNIQUE: Contiguous axial images were obtained from the base of the skull through the vertex without intravenous contrast. RADIATION DOSE REDUCTION:  This exam was performed according to the departmental dose-optimization program which includes automated exposure control, adjustment of the mA and/or kV according to patient size and/or use of iterative reconstruction technique. COMPARISON:  06/09/2021 FINDINGS: Brain: No evidence of acute infarction, hemorrhage, mass, mass effect, or midline shift. No hydrocephalus or extra-axial collection. Periventricular white matter changes, likely the sequela of chronic small vessel ischemic disease. Previously noted left cerebral convexity subdural collection is no longer seen. Vascular: No hyperdense vessel. Atherosclerotic  calcifications in the intracranial carotid and vertebral arteries. Skull: Negative for fracture or focal lesion. Sinuses/Orbits: No acute finding. Other: The mastoid air cells are well aerated. ASPECTS [facility] Stroke Program Early CT Score) - Ganglionic level infarction (caudate, lentiform nuclei, internal capsule, insula, M1-M3 cortex): 7 - Supraganglionic infarction (M4-M6 cortex): 3 Total score (0-10 with 10 being normal): 10 IMPRESSION: 1. No acute intracranial process. 2. ASPECTS is 10. Imaging results were communicated on 02/22/2023 at 11:41 pm to provider Dr. Otelia Limes via secure text paging. Electronically Signed   By: Wiliam Ke M.D.   On: 02/22/2023 23:41    Vitals:   02/23/23 0600 02/23/23 0729 02/23/23 0735 02/23/23 0743  BP: (!) 160/97 (!) 140/62 (!) 160/97   Pulse: (!) 59 73 60   Resp: 17 15 16    Temp:    98.6 F (37 C)  TempSrc:    Oral  SpO2: 100% 96% 100%   Weight:         PHYSICAL EXAM General:  Alert, well-nourished, well-developed patient in no acute distress Psych:  Mood and affect appropriate for situation CV: Regular rate and rhythm on monitor Respiratory:  Regular, unlabored respirations on room air GI: Abdomen soft and nontender   Neurological Examination Mental Status: Somnolent requiring frequent stimulation to be aroused to a drowsy state. Speech is dysarthric and sparse, with short answers to all questions varying in length from 1-4 words. Able to name his ear, nose and chin when touched, but has difficulty identifying objects by sight. Able to follow only simple commands.  Cranial Nerves: II: Does not blink to threat on right and left. PERRL  III,IV, VI: Gaze preference to the right. Can track an examiner's face briefly to the left, but gaze rapidly moves back to the right. No nystagmus.   V: Insensate on the left.  VII: Smile symmetric VIII: Hearing intact to voice IX,X: No hoarseness XI: Head preferentially rotated to the right.  XII: Dysarthric  speech.  Motor/Sensory: RUE with no drift. 4/5 to resistance.  LUE 0/5 with flaccid tone RLE able to elevate antigravity without drift.  LLE drops immediately to bed with flaccid tone, but can move slightly when noxious is applied to RIGHT leg.  Deep Tendon Reflexes: Hypoactive patellars.  Cerebellar: No ataxia with FNF on the right, but with slow movement.   Gait: Unable to assess   ASSESSMENT/PLAN  Acute Ischemic Infarct:  right MCA infarct , etiology:  afib not on anticoagulation   Code Stroke CT head No acute abnormality. ASPECTS 10.    Not able to have MRI due to incompatible pacer Repeat CT Head: Interim development of acute right MCA infarct involving the right temporal lobe, right insula/sub insula, and right basal ganglia. Interim development of hyperdense distal right MCA consistent with thrombus. Atrophy and advanced chronic small vessel ischemic changes of the white matter. Carotid Doppler unremarkable  2D Echo EF 50-55%, LA severely dilated, RA mild/mod dilated  LDL 49 HgbA1c 5.6 VTE prophylaxis - Heparin 5000u No antithrombotic prior  to admission, now on aspirin 300 mg suppository daily until PO access and then ASA and Plavix DAPT. May consider continue DAPT indefinitely if tolerable since pt not AC or watchman candidate Therapy recommendations:  pending Disposition:  pending   Hx of SDH Traumatic subdural hematoma in January   Atrial fibrillation  Home Meds: no anticoagulation Hx of GI bleed with coumadin Hx of traumatic SDH with fall as above Not candidate for watchman either now on aspirin 300 mg suppository daily until PO access and then ASA and Plavix DAPT. May consider continue DAPT indefinitely  Hypertension Congestive Heart Failure  S/p PPM Home meds:  Lasix Stable Permissive hypertension for 24-48 hours and then gradually normalize BP in 2-3 days  Lipid management LDL 49, goal less than 70 No high intensity statin needed given LDL at goal  CKD  stage IV with AKI Urinary retention, bladder distention, severe bilateral hydronephrosis  UTI Hyperkalemia  Cr 4.70 -> 3.94 GFR 13 K 5.5 -> 5.4 UA Pending Abx- ceftriaxone  Urology placed foley  Dysphagia Patient has post-stroke dysphagia, SLP consulted Currently NPO Will need cortrak for nutrition and po meds  Other stroke risk factors Advanced age Hx of DVT  Other Active Problems Melanoma   Hospital day # 0   Patient seen and examined by NP/APP with MD. MD to update note as needed.   Elmer Picker, DNP, FNP-BC [facility] Pager: [phone]  ATTENDING NOTE: I reviewed above note and agree with the assessment and plan. Pt was seen and examined.   Daughter at bedside.  Patient lying bed, lethargic, but eyes open on voice, orientated to age and people, not oriented to place or time.  Moderate dysarthria, paucity of speech, able to name 2/2 but did not repeat with me.  Follows simple commands on the right.  Left hemianopia, left neglect, left facial droop.  Right gaze preference, did not pass midline.  Left UE proximal 2/5, distal 0/5.  Left lower extremity flaccid.  Right lower extremity 2/5.  Diminished sensation on the left.  Right finger-to-nose not cooperative.  Gait not tested.  Patient not able to have MRI due to incompatible pacemaker.  CT repeat showed right MCA stroke, likely due to A-fib not on [facility].  Patient not candidate for [facility] given history of GI bleeding and recent traumatic SDH.  Not Watchman device candidate either.  Currently on aspirin 300 PR.  Recommend DAPT indefinitely once p.o. access if able to tolerate.  Currently n.p.o., likely need core Trak for nutrition and p.o. medication.  Found to have AKI on CKD with urinary retention and bilateral hydronephrosis, urology on board, placed a Foley.  Management per primary team.  PT and OT pending.  Will follow.  For detailed assessment and plan, please refer to above/below as I have made changes  wherever appropriate.   Marvel Plan, MD PhD Stroke Neurology 02/23/2023 6:02 PM  I discussed with Dr. Ashok Pall. I spent extensive face-to-face time with the patient and her daughter, more than 50% of which was spent in counseling and coordination of care, reviewing test results, images and medication, and discussing the diagnosis, treatment plan and potential prognosis. This patient's care requiresreview of multiple databases, neurological assessment, discussion with family, other specialists and medical decision making of high complexity.      To contact Stroke Continuity provider, please refer to [email or web address]. After hours, contact General Neurology

## 2023-02-23 NOTE — ED Notes (Signed)
ED TO INPATIENT HANDOFF REPORT  ED Nurse Name and Phone [phone]  S Name/Age/Gender Howard Harmon 89 y.o. male Room/Bed: 037C/037C  Code Status   Code Status: Full Code  Home/SNF/Other Home Patient oriented to: self and place Is this baseline? No   Triage Complete: Triage complete  Chief Complaint Acute left-sided weakness [R53.1]  Triage Note Patient comes in code stroke. Last known well 2215. Caregiver states that he was fine and came back from the bathroom saying he was very tired. Patient then slumped to the left side, unable to move left arm, and had slurred speech. The patient came in Code Stroke from [facility]. Patient able to answer questions but has left sided deficits.    Allergies Allergies  Allergen Reactions   Warfarin Other (See Comments)    Bleeding diff to stop    Level of Care/Admitting Diagnosis ED Disposition     ED Disposition  Admit   Condition  --   Comment  Hospital Area: [facility] [[id number]]  Level of Care: Telemetry Medical [104]  May admit patient to [facility] or [facility] if equivalent level of care is available:: No  Covid Evaluation: Asymptomatic - no recent exposure (last 10 days) testing not required  Diagnosis: Acute left-sided weakness [360757]  Admitting Physician: Briscoe Deutscher [[id number]]  Attending Physician: Briscoe Deutscher [[id number]]  Certification:: I certify this patient will need inpatient services for at least 2 midnights  Expected Medical Readiness: 02/26/2023          B Medical/Surgery History Past Medical History:  Diagnosis Date   Arthritis    Atrial fibrillation (HCC)    Blood clotting tendency (HCC)    Chronic kidney disease    DVT (deep venous thrombosis) (HCC)    Hypertension    Melanoma (HCC)    Pacemaker    Past Surgical History:  Procedure Laterality Date   CHOLECYSTECTOMY     COLONOSCOPY WITH PROPOFOL N/A 10/15/2014   Procedure: COLONOSCOPY WITH PROPOFOL;   Surgeon: Elnita Maxwell, MD;  Location: [facility] ENDOSCOPY;  Service: Endoscopy;  Laterality: N/A;   COLONOSCOPY WITH PROPOFOL N/A 10/23/2017   Procedure: COLONOSCOPY WITH PROPOFOL;  Surgeon: Midge Minium, MD;  Location: [facility] ENDOSCOPY;  Service: Endoscopy;  Laterality: N/A;   ESOPHAGOGASTRODUODENOSCOPY N/A 10/24/2017   Procedure: ESOPHAGOGASTRODUODENOSCOPY (EGD);  Surgeon: Pasty Spillers, MD;  Location: [facility] ENDOSCOPY;  Service: Endoscopy;  Laterality: N/A;   INSERT / REPLACE / REMOVE PACEMAKER       A IV Location/Drains/Wounds Patient Lines/Drains/Airways Status     Active Line/Drains/Airways     Name Placement date Placement time Site Days   Peripheral IV 02/23/23 18 G Anterior;Right Wrist 02/23/23  0002  Wrist  less than 1   Peripheral IV 02/23/23 20 G Right Antecubital 02/23/23  0015  Antecubital  less than 1            Intake/Output Last 24 hours No intake or output data in the 24 hours ending 02/23/23 0524  Labs/Imaging Results for orders placed or performed during the hospital encounter of 02/22/23 (from the past 48 hour(s))  Protime-INR     Status: Abnormal   Collection Time: 02/22/23 11:34 PM  Result Value Ref Range   Prothrombin Time 16.0 (H) 11.4 - 15.2 seconds   INR 1.3 (H) 0.8 - 1.2    Comment: (NOTE) INR goal varies based on device and disease states. Performed at [facility], [address] [place], [place] [address]  APTT     Status: None   Collection Time: 02/22/23 11:34 PM  Result Value Ref Range   aPTT 36 24 - 36 seconds    Comment: Performed at [facility], [address], [place], [place] [address]  CBC     Status: Abnormal   Collection Time: 02/22/23 11:34 PM  Result Value Ref Range   WBC 12.3 (H) 4.0 - 10.5 K/uL   RBC 2.69 (L) 4.22 - 5.81 MIL/uL   Hemoglobin 9.2 (L) 13.0 - 17.0 g/dL   HCT 60.4 (L) 54.0 - 98.1 %   MCV 107.1 (H) 80.0 - 100.0 fL   MCH 34.2 (H) 26.0 - 34.0 pg   MCHC 31.9 30.0 - 36.0 g/dL   RDW 19.1 47.8 -  29.5 %   Platelets 172 150 - 400 K/uL   nRBC 0.0 0.0 - 0.2 %    Comment: Performed at [facility], [address] [place], [place] [address]  Differential     Status: Abnormal   Collection Time: 02/22/23 11:34 PM  Result Value Ref Range   Neutrophils Relative % 73 %   Neutro Abs 8.9 (H) 1.7 - 7.7 K/uL   Lymphocytes Relative 13 %   Lymphs Abs 1.6 0.7 - 4.0 K/uL   Monocytes Relative 11 %   Monocytes Absolute 1.4 (H) 0.1 - 1.0 K/uL   Eosinophils Relative 2 %   Eosinophils Absolute 0.3 0.0 - 0.5 K/uL   Basophils Relative 0 %   Basophils Absolute 0.0 0.0 - 0.1 K/uL   Immature Granulocytes 1 %   Abs Immature Granulocytes 0.08 (H) 0.00 - 0.07 K/uL    Comment: Performed at [facility], [address] [place], [place] [address]  Comprehensive metabolic panel     Status: Abnormal   Collection Time: 02/22/23 11:34 PM  Result Value Ref Range   Sodium 145 135 - 145 mmol/L   Potassium 5.5 (H) 3.5 - 5.1 mmol/L   Chloride 113 (H) 98 - 111 mmol/L   CO2 18 (L) 22 - 32 mmol/L   Glucose, Bld 107 (H) 70 - 99 mg/dL    Comment: Glucose reference range applies only to samples taken after fasting for at least 8 hours.   BUN 61 (H) 8 - 23 mg/dL   Creatinine, Ser 4.69 (H) 0.61 - 1.24 mg/dL   Calcium 8.7 (L) 8.9 - 10.3 mg/dL   Total Protein 6.2 (L) 6.5 - 8.1 g/dL   Albumin 2.6 (L) 3.5 - 5.0 g/dL   AST 24 15 - 41 U/L   ALT 23 0 - 44 U/L   Alkaline Phosphatase 62 38 - 126 U/L   Total Bilirubin 0.6 0.3 - 1.2 mg/dL   GFR, Estimated 13 (L) >60 mL/min    Comment: (NOTE) Calculated using the CKD-EPI Creatinine Equation (2021)    Anion gap 14 5 - 15    Comment: Performed at [facility], [address], [place], [place] [address]  I-stat chem 8, ED     Status: Abnormal   Collection Time: 02/22/23 11:36 PM  Result Value Ref Range   Sodium 145 135 - 145 mmol/L   Potassium 5.4 (H) 3.5 - 5.1 mmol/L   Chloride 116 (H) 98 - 111 mmol/L   BUN 56 (H) 8 - 23 mg/dL   Creatinine, Ser 8.41  (H) 0.61 - 1.24 mg/dL   Glucose, Bld 324 (H) 70 - 99 mg/dL    Comment: Glucose reference range applies  only to samples taken after fasting for at least 8 hours.   Calcium, Ion 1.13 (L) 1.15 - 1.40 mmol/L   TCO2 18 (L) 22 - 32 mmol/L   Hemoglobin 9.2 (L) 13.0 - 17.0 g/dL   HCT 40.1 (L) 02.7 - 25.3 %  Ethanol     Status: None   Collection Time: 02/22/23 11:51 PM  Result Value Ref Range   Alcohol, Ethyl (B) <10 <10 mg/dL    Comment: (NOTE) Lowest detectable limit for serum alcohol is 10 mg/dL.  For medical purposes only. Performed at [facility], [address], [place], [place] [address]   CBG monitoring, ED     Status: None   Collection Time: 02/23/23 12:15 AM  Result Value Ref Range   Glucose-Capillary 98 70 - 99 mg/dL    Comment: Glucose reference range applies only to samples taken after fasting for at least 8 hours.  Hemoglobin A1c     Status: None   Collection Time: 02/23/23  3:04 AM  Result Value Ref Range   Hgb A1c MFr Bld 5.6 4.8 - 5.6 %    Comment: (NOTE) Pre diabetes:          5.7%-6.4%  Diabetes:              >6.4%  Glycemic control for   <7.0% adults with diabetes    Mean Plasma Glucose 114.02 mg/dL    Comment: Performed at [facility], [address], [place], [place] [address]  CBC     Status: Abnormal   Collection Time: 02/23/23  3:04 AM  Result Value Ref Range   WBC 10.5 4.0 - 10.5 K/uL   RBC 2.70 (L) 4.22 - 5.81 MIL/uL   Hemoglobin 9.0 (L) 13.0 - 17.0 g/dL   HCT 59.5 (L) 63.8 - 75.6 %   MCV 105.9 (H) 80.0 - 100.0 fL   MCH 33.3 26.0 - 34.0 pg   MCHC 31.5 30.0 - 36.0 g/dL   RDW 43.3 29.5 - 18.8 %   Platelets 162 150 - 400 K/uL   nRBC 0.0 0.0 - 0.2 %    Comment: Performed at [facility], [address], [place], [place] [address]  Magnesium     Status: None   Collection Time: 02/23/23  3:04 AM  Result Value Ref Range   Magnesium 2.2 1.7 - 2.4 mg/dL    Comment: Performed at [facility], [address], [place], [place]  [address]   US RENAL  Result Date: 02/23/2023 CLINICAL DATA:  [id number] Acute renal failure superimposed on stage 3 chronic kidney disease, unspecified acute renal failure type, unspecified whether stage 3a or 3b CKD (HCC) [id number] EXAM: RENAL / URINARY TRACT ULTRASOUND COMPLETE COMPARISON:  None Available. FINDINGS: Right Kidney: Renal measurements: 10.2 x 6 x 4.2 cm = volume: 132 mL. Echogenicity within normal limits. No mass. Severe hydronephrosis visualized. Left Kidney: Renal measurements: 10.1 x 4.5 x 3.6 cm = volume: 85 mL. Echogenicity within normal limits. No mass. Severe hydronephrosis visualized. Urinary bladder: Debris within the urinary bladder lumen. Distended with urine. Otherwise appears normal for degree of bladder distention. Other: None. IMPRESSION: Urinary bladder lumen distended with urine with bilateral severe hydronephrosis. Electronically Signed   By: Tish Frederickson M.D.   On: 02/23/2023 03:01   CT HEAD CODE STROKE WO CONTRAST  Result Date: 02/22/2023 CLINICAL DATA:  Code stroke.  Stroke suspected EXAM: CT HEAD WITHOUT CONTRAST TECHNIQUE: Contiguous axial images were obtained from  the base of the skull through the vertex without intravenous contrast. RADIATION DOSE REDUCTION: This exam was performed according to the departmental dose-optimization program which includes automated exposure control, adjustment of the mA and/or kV according to patient size and/or use of iterative reconstruction technique. COMPARISON:  06/09/2021 FINDINGS: Brain: No evidence of acute infarction, hemorrhage, mass, mass effect, or midline shift. No hydrocephalus or extra-axial collection. Periventricular white matter changes, likely the sequela of chronic small vessel ischemic disease. Previously noted left cerebral convexity subdural collection is no longer seen. Vascular: No hyperdense vessel. Atherosclerotic calcifications in the intracranial carotid and vertebral arteries. Skull: Negative for fracture or focal  lesion. Sinuses/Orbits: No acute finding. Other: The mastoid air cells are well aerated. ASPECTS [facility] Stroke Program Early CT Score) - Ganglionic level infarction (caudate, lentiform nuclei, internal capsule, insula, M1-M3 cortex): 7 - Supraganglionic infarction (M4-M6 cortex): 3 Total score (0-10 with 10 being normal): 10 IMPRESSION: 1. No acute intracranial process. 2. ASPECTS is 10. Imaging results were communicated on 02/22/2023 at 11:41 pm to provider Dr. Otelia Limes via secure text paging. Electronically Signed   By: Wiliam Ke M.D.   On: 02/22/2023 23:41    Pending Labs Unresulted Labs (From admission, onward)     Start     Ordered   02/23/23 0500  Lipid panel  (Labs)  Tomorrow morning,   R       Comments: Fasting    02/23/23 0055   02/23/23 0500  CBC  (Labs)  Daily,   R      02/23/23 0055   02/23/23 0055  Urinalysis, Complete w Microscopic -Urine, Clean Catch  Once,   R       Question:  Specimen Source  Answer:  Urine, Clean Catch   02/23/23 0055   02/23/23 0055  Sodium, urine, random  Once,   R        02/23/23 0055   02/23/23 0055  Creatinine, urine, random  Once,   R        02/23/23 0055   02/23/23 0055  Urea nitrogen, urine  Once,   R        02/23/23 0055            Vitals/Pain Today's Vitals   02/23/23 0030 02/23/23 0045 02/23/23 0100 02/23/23 0342  BP: (!) 144/86 (!) 155/85 (!) 154/84 (!) 161/92  Pulse: (!) 59 60 (!) 57 60  Resp: 18 14 13 20   Temp:    (!) 97 F (36.1 C)  TempSrc:    Axillary  SpO2: 100% 100% 99% 100%  Weight:      PainSc:    Asleep    Isolation Precautions No active isolations  Medications Medications  aspirin chewable tablet 81 mg (has no administration in time range)   stroke: early stages of recovery book (has no administration in time range)  acetaminophen (TYLENOL) tablet 650 mg (has no administration in time range)    Or  acetaminophen (TYLENOL) 160 MG/5ML solution 650 mg (has no administration in time range)    Or   acetaminophen (TYLENOL) suppository 650 mg (has no administration in time range)  senna-docusate (Senokot-S) tablet 1 tablet (has no administration in time range)  heparin injection 5,000 Units (5,000 Units Subcutaneous Given 02/23/23 0131)  sodium bicarbonate 150 mEq in sterile water 1,150 mL infusion ( Intravenous New Bag/Given 02/23/23 0131)  sodium chloride flush (NS) 0.9 % injection 3 mL (3 mLs Intravenous Given 02/23/23 0016)  sodium chloride 0.9 % bolus 500 mL (  0 mLs Intravenous Stopped 02/23/23 0342)  aspirin suppository 300 mg (300 mg Rectal Given 02/23/23 0131)    Mobility non-ambulatory     Focused Assessments Neuro Assessment Handoff:  Swallow screen pass?  Cardiac Rhythm: Sinus bradycardia NIH Stroke Scale  Dizziness Present: No Headache Present: No Interval: Initial Level of Consciousness (1a.)   : Not alert, but arousable by minor stimulation to obey, answer, or respond LOC Questions (1b. )   : Answers both questions correctly LOC Commands (1c. )   : Performs both tasks correctly Best Gaze (2. )  : Forced deviation Visual (3. )  : Complete hemianopia Facial Palsy (4. )    : Complete paralysis of one or both sides Motor Arm, Left (5a. )   : No movement Motor Arm, Right (5b. ) : No drift Motor Leg, Left (6a. )  : No movement Motor Leg, Right (6b. ) : Drift Limb Ataxia (7. ): Absent Sensory (8. )  : Severe to total sensory loss, patient is not aware of being touched in the face, arm, and leg Best Language (9. )  : Severe aphasia Dysarthria (10. ): Mild-to-moderate dysarthria, patient slurs at least some words and, at worst, can be understood with some difficulty Extinction/Inattention (11.)   : No Abnormality Complete NIHSS TOTAL: 22 Last date known well: 02/22/23 Last time known well: 2215 Neuro Assessment:   Neuro Checks:   Initial (02/23/23 0000)  Has TPA been given? No If patient is a Neuro Trauma and patient is going to OR before floor call report to 4N  Charge nurse: [phone] or [phone]   R Recommendations: See Admitting Provider Note  Report given to:   Additional Notes: Patient was living a home alone during the day and had someone sitting with him at night. Patient is totally flaccid on the left.

## 2023-02-23 NOTE — ED Notes (Signed)
ED TO INPATIENT HANDOFF REPORT  ED Nurse Name and Phone #: [phone]  S Name/Age/Gender Howard Harmon 89 y.o. male Room/Bed: 037C/037C  Code Status   Code Status: Full Code  Home/SNF/Other Home Patient oriented to: self Is this baseline? No   Triage Complete: Triage complete  Chief Complaint Acute left-sided weakness [R53.1]  Triage Note Patient comes in code stroke. Last known well 2215. Caregiver states that he was fine and came back from the bathroom saying he was very tired. Patient then slumped to the left side, unable to move left arm, and had slurred speech. The patient came in Code Stroke from [facility]. Patient able to answer questions but has left sided deficits.    Allergies Allergies  Allergen Reactions   Warfarin Other (See Comments)    Bleeding diff to stop    Level of Care/Admitting Diagnosis ED Disposition     ED Disposition  Admit   Condition  --   Comment  Hospital Area: [facility] [[id number]]  Level of Care: Telemetry Medical [104]  May admit patient to [facility] or [facility] if equivalent level of care is available:: No  Covid Evaluation: Asymptomatic - no recent exposure (last 10 days) testing not required  Diagnosis: Acute left-sided weakness [360757]  Admitting Physician: Briscoe Deutscher [[id number]]  Attending Physician: Briscoe Deutscher [[id number]]  Certification:: I certify this patient will need inpatient services for at least 2 midnights  Expected Medical Readiness: 02/26/2023          B Medical/Surgery History Past Medical History:  Diagnosis Date   Arthritis    Atrial fibrillation (HCC)    Blood clotting tendency (HCC)    Chronic kidney disease    DVT (deep venous thrombosis) (HCC)    Hypertension    Melanoma (HCC)    Pacemaker    Past Surgical History:  Procedure Laterality Date   CHOLECYSTECTOMY     COLONOSCOPY WITH PROPOFOL N/A 10/15/2014   Procedure: COLONOSCOPY WITH PROPOFOL;  Surgeon:  Elnita Maxwell, MD;  Location: [facility] ENDOSCOPY;  Service: Endoscopy;  Laterality: N/A;   COLONOSCOPY WITH PROPOFOL N/A 10/23/2017   Procedure: COLONOSCOPY WITH PROPOFOL;  Surgeon: Midge Minium, MD;  Location: [facility] ENDOSCOPY;  Service: Endoscopy;  Laterality: N/A;   ESOPHAGOGASTRODUODENOSCOPY N/A 10/24/2017   Procedure: ESOPHAGOGASTRODUODENOSCOPY (EGD);  Surgeon: Pasty Spillers, MD;  Location: [facility] ENDOSCOPY;  Service: Endoscopy;  Laterality: N/A;   INSERT / REPLACE / REMOVE PACEMAKER       A IV Location/Drains/Wounds Patient Lines/Drains/Airways Status     Active Line/Drains/Airways     Name Placement date Placement time Site Days   Peripheral IV 02/23/23 18 G Anterior;Right Wrist 02/23/23  0002  Wrist  less than 1   Peripheral IV 02/23/23 20 G Right Antecubital 02/23/23  0015  Antecubital  less than 1            Intake/Output Last 24 hours No intake or output data in the 24 hours ending 02/23/23 1303  Labs/Imaging Results for orders placed or performed during the hospital encounter of 02/22/23 (from the past 48 hour(s))  Protime-INR     Status: Abnormal   Collection Time: 02/22/23 11:34 PM  Result Value Ref Range   Prothrombin Time 16.0 (H) 11.4 - 15.2 seconds   INR 1.3 (H) 0.8 - 1.2    Comment: (NOTE) INR goal varies based on device and disease states. Performed at [facility], [address], [place], [place] [address]  APTT     Status: None   Collection Time: 02/22/23 11:34 PM  Result Value Ref Range   aPTT 36 24 - 36 seconds    Comment: Performed at [facility], [address] [place], [place] [address]  CBC     Status: Abnormal   Collection Time: 02/22/23 11:34 PM  Result Value Ref Range   WBC 12.3 (H) 4.0 - 10.5 K/uL   RBC 2.69 (L) 4.22 - 5.81 MIL/uL   Hemoglobin 9.2 (L) 13.0 - 17.0 g/dL   HCT 69.6 (L) 29.5 - 28.4 %   MCV 107.1 (H) 80.0 - 100.0 fL   MCH 34.2 (H) 26.0 - 34.0 pg   MCHC 31.9 30.0 - 36.0 g/dL   RDW 13.2 44.0 - 10.2 %    Platelets 172 150 - 400 K/uL   nRBC 0.0 0.0 - 0.2 %    Comment: Performed at [facility], [address] [place], [place] [address]  Differential     Status: Abnormal   Collection Time: 02/22/23 11:34 PM  Result Value Ref Range   Neutrophils Relative % 73 %   Neutro Abs 8.9 (H) 1.7 - 7.7 K/uL   Lymphocytes Relative 13 %   Lymphs Abs 1.6 0.7 - 4.0 K/uL   Monocytes Relative 11 %   Monocytes Absolute 1.4 (H) 0.1 - 1.0 K/uL   Eosinophils Relative 2 %   Eosinophils Absolute 0.3 0.0 - 0.5 K/uL   Basophils Relative 0 %   Basophils Absolute 0.0 0.0 - 0.1 K/uL   Immature Granulocytes 1 %   Abs Immature Granulocytes 0.08 (H) 0.00 - 0.07 K/uL    Comment: Performed at [facility], [address], [place], [place] [address]  Comprehensive metabolic panel     Status: Abnormal   Collection Time: 02/22/23 11:34 PM  Result Value Ref Range   Sodium 145 135 - 145 mmol/L   Potassium 5.5 (H) 3.5 - 5.1 mmol/L   Chloride 113 (H) 98 - 111 mmol/L   CO2 18 (L) 22 - 32 mmol/L   Glucose, Bld 107 (H) 70 - 99 mg/dL    Comment: Glucose reference range applies only to samples taken after fasting for at least 8 hours.   BUN 61 (H) 8 - 23 mg/dL   Creatinine, Ser 4.74 (H) 0.61 - 1.24 mg/dL   Calcium 8.7 (L) 8.9 - 10.3 mg/dL   Total Protein 6.2 (L) 6.5 - 8.1 g/dL   Albumin 2.6 (L) 3.5 - 5.0 g/dL   AST 24 15 - 41 U/L   ALT 23 0 - 44 U/L   Alkaline Phosphatase 62 38 - 126 U/L   Total Bilirubin 0.6 0.3 - 1.2 mg/dL   GFR, Estimated 13 (L) >60 mL/min    Comment: (NOTE) Calculated using the CKD-EPI Creatinine Equation (2021)    Anion gap 14 5 - 15    Comment: Performed at [facility], [address], [place], [place] [address]  I-stat chem 8, ED     Status: Abnormal   Collection Time: 02/22/23 11:36 PM  Result Value Ref Range   Sodium 145 135 - 145 mmol/L   Potassium 5.4 (H) 3.5 - 5.1 mmol/L   Chloride 116 (H) 98 - 111 mmol/L   BUN 56 (H) 8 - 23 mg/dL   Creatinine, Ser 3.87 (H) 0.61 -  1.24 mg/dL   Glucose, Bld 564 (H) 70 - 99 mg/dL    Comment: Glucose reference range applies  only to samples taken after fasting for at least 8 hours.   Calcium, Ion 1.13 (L) 1.15 - 1.40 mmol/L   TCO2 18 (L) 22 - 32 mmol/L   Hemoglobin 9.2 (L) 13.0 - 17.0 g/dL   HCT 40.9 (L) 81.1 - 91.4 %  Ethanol     Status: None   Collection Time: 02/22/23 11:51 PM  Result Value Ref Range   Alcohol, Ethyl (B) <10 <10 mg/dL    Comment: (NOTE) Lowest detectable limit for serum alcohol is 10 mg/dL.  For medical purposes only. Performed at [facility], [address], [place], [place] [address]   CBG monitoring, ED     Status: None   Collection Time: 02/23/23 12:15 AM  Result Value Ref Range   Glucose-Capillary 98 70 - 99 mg/dL    Comment: Glucose reference range applies only to samples taken after fasting for at least 8 hours.  Lipid panel     Status: Abnormal   Collection Time: 02/23/23  3:04 AM  Result Value Ref Range   Cholesterol 84 0 - 200 mg/dL   Triglycerides 44 <621 mg/dL   HDL 26 (L) >30 mg/dL   Total CHOL/HDL Ratio 3.2 RATIO   VLDL 9 0 - 40 mg/dL   LDL Cholesterol 49 0 - 99 mg/dL    Comment:        Total Cholesterol/HDL:CHD Risk Coronary Heart Disease Risk Table                     Men   Women  1/2 Average Risk   3.4   3.3  Average Risk       5.0   4.4  2 X Average Risk   9.6   7.1  3 X Average Risk  23.4   11.0        Use the calculated Patient Ratio above and the CHD Risk Table to determine the patient's CHD Risk.        ATP III CLASSIFICATION (LDL):  <100     mg/dL   Optimal  865-784  mg/dL   Near or Above                    Optimal  130-159  mg/dL   Borderline  696-295  mg/dL   High  >284     mg/dL   Very High Performed at [facility], [address], [place], [place] [address]   Hemoglobin A1c     Status: None   Collection Time: 02/23/23  3:04 AM  Result Value Ref Range   Hgb A1c MFr Bld 5.6 4.8 - 5.6 %    Comment: (NOTE) Pre diabetes:           5.7%-6.4%  Diabetes:              >6.4%  Glycemic control for   <7.0% adults with diabetes    Mean Plasma Glucose 114.02 mg/dL    Comment: Performed at [facility], [address] [place], [place] [address]  CBC     Status: Abnormal   Collection Time: 02/23/23  3:04 AM  Result Value Ref Range   WBC 10.5 4.0 - 10.5 K/uL   RBC 2.70 (L) 4.22 - 5.81 MIL/uL   Hemoglobin 9.0 (L) 13.0 - 17.0 g/dL   HCT 25.3 (L) 66.4 - 40.3 %   MCV 105.9 (H) 80.0 - 100.0 fL   MCH 33.3 26.0 - 34.0 pg  MCHC 31.5 30.0 - 36.0 g/dL   RDW 16.1 09.6 - 04.5 %   Platelets 162 150 - 400 K/uL   nRBC 0.0 0.0 - 0.2 %    Comment: Performed at [facility], [address], [place], [place] [address]  Magnesium     Status: None   Collection Time: 02/23/23  3:04 AM  Result Value Ref Range   Magnesium 2.2 1.7 - 2.4 mg/dL    Comment: Performed at [facility], [address] [place], [place] [address]  Basic metabolic panel     Status: Abnormal   Collection Time: 02/23/23  9:24 AM  Result Value Ref Range   Sodium 143 135 - 145 mmol/L   Potassium 5.2 (H) 3.5 - 5.1 mmol/L   Chloride 113 (H) 98 - 111 mmol/L   CO2 21 (L) 22 - 32 mmol/L   Glucose, Bld 87 70 - 99 mg/dL    Comment: Glucose reference range applies only to samples taken after fasting for at least 8 hours.   BUN 57 (H) 8 - 23 mg/dL   Creatinine, Ser 8.29 (H) 0.61 - 1.24 mg/dL   Calcium 8.1 (L) 8.9 - 10.3 mg/dL   GFR, Estimated 14 (L) >60 mL/min    Comment: (NOTE) Calculated using the CKD-EPI Creatinine Equation (2021)    Anion gap 9 5 - 15    Comment: Performed at [facility], [address] [place], [place] [address]   VAS US CAROTID  Result Date: 02/23/2023 Carotid Arterial Duplex Study Patient Name:  Howard Harmon  Date of Exam:   02/23/2023 Medical Rec #: [id number]          Accession #:    [id number] Date of Birth: 08-09-1929           Patient Gender: M Patient Age:   37 years Exam Location:  [facility]  Procedure:      VAS US CAROTID Referring Phys: Elmer Picker --------------------------------------------------------------------------------  Indications:   CVA and Acute onset of left sided weakness, speech difficulty. Risk Factors:  Hypertension, hyperlipidemia, coronary artery disease. Other Factors: Pacemaker, history of DVT. Performing Technologist: Marilynne Halsted RDMS, RVT  Examination Guidelines: A complete evaluation includes B-mode imaging, spectral Doppler, color Doppler, and power Doppler as needed of all accessible portions of each vessel. Bilateral testing is considered an integral part of a complete examination. Limited examinations for reoccurring indications may be performed as noted.  Right Carotid Findings: +----------+--------+--------+--------+------------------+------------------+           PSV cm/sEDV cm/sStenosisPlaque DescriptionComments           +----------+--------+--------+--------+------------------+------------------+ CCA Prox  54      11                                                   +----------+--------+--------+--------+------------------+------------------+ CCA Mid                                             intimal thickening +----------+--------+--------+--------+------------------+------------------+ CCA Distal33      11                                                   +----------+--------+--------+--------+------------------+------------------+  ICA Prox  28      8       1-39%                     intimal thickening +----------+--------+--------+--------+------------------+------------------+ ICA Mid   47      12                                                   +----------+--------+--------+--------+------------------+------------------+ ICA Distal36      12                                                   +----------+--------+--------+--------+------------------+------------------+ ECA       46      9                                                     +----------+--------+--------+--------+------------------+------------------+ +----------+--------+-------+----------------+-------------------+           PSV cm/sEDV cmsDescribe        Arm Pressure (mmHG) +----------+--------+-------+----------------+-------------------+ [id number]             Multiphasic, WNL                    +----------+--------+-------+----------------+-------------------+ +---------+--------+--------+------------+ VertebralPSV cm/sEDV cm/sNot assessed +---------+--------+--------+------------+  Left Carotid Findings: +----------+--------+--------+--------+------------------+------------------+           PSV cm/sEDV cm/sStenosisPlaque DescriptionComments           +----------+--------+--------+--------+------------------+------------------+ CCA Prox  38      16                                                   +----------+--------+--------+--------+------------------+------------------+ CCA Distal49      19                                                   +----------+--------+--------+--------+------------------+------------------+ ICA Prox  56      17      1-39%                     intimal thickening +----------+--------+--------+--------+------------------+------------------+ ICA Mid   58      21                                                   +----------+--------+--------+--------+------------------+------------------+ ICA Distal72      25                                                   +----------+--------+--------+--------+------------------+------------------+  ECA       40      7                                                    +----------+--------+--------+--------+------------------+------------------+ +----------+--------+--------+----------------+-------------------+           PSV cm/sEDV cm/sDescribe        Arm Pressure (mmHG)  +----------+--------+--------+----------------+-------------------+ [id number]              Multiphasic, WNL                    +----------+--------+--------+----------------+-------------------+ +---------+--------+--+--------+--+---------+ VertebralPSV cm/s80EDV cm/s16Antegrade +---------+--------+--+--------+--+---------+   Summary: Right Carotid: Velocities in the right ICA are consistent with a 1-39% stenosis. Left Carotid: Velocities in the left ICA are consistent with a 1-39% stenosis.  *See table(s) above for measurements and observations.     Preliminary    US RENAL  Result Date: 02/23/2023 CLINICAL DATA:  [id number] Acute renal failure superimposed on stage 3 chronic kidney disease, unspecified acute renal failure type, unspecified whether stage 3a or 3b CKD (HCC) 8295621 EXAM: RENAL / URINARY TRACT ULTRASOUND COMPLETE COMPARISON:  None Available. FINDINGS: Right Kidney: Renal measurements: 10.2 x 6 x 4.2 cm = volume: 132 mL. Echogenicity within normal limits. No mass. Severe hydronephrosis visualized. Left Kidney: Renal measurements: 10.1 x 4.5 x 3.6 cm = volume: 85 mL. Echogenicity within normal limits. No mass. Severe hydronephrosis visualized. Urinary bladder: Debris within the urinary bladder lumen. Distended with urine. Otherwise appears normal for degree of bladder distention. Other: None. IMPRESSION: Urinary bladder lumen distended with urine with bilateral severe hydronephrosis. Electronically Signed   By: Tish Frederickson M.D.   On: 02/23/2023 03:01   CT HEAD CODE STROKE WO CONTRAST  Result Date: 02/22/2023 CLINICAL DATA:  Code stroke.  Stroke suspected EXAM: CT HEAD WITHOUT CONTRAST TECHNIQUE: Contiguous axial images were obtained from the base of the skull through the vertex without intravenous contrast. RADIATION DOSE REDUCTION: This exam was performed according to the departmental dose-optimization program which includes automated exposure control, adjustment of the mA  and/or kV according to patient size and/or use of iterative reconstruction technique. COMPARISON:  06/09/2021 FINDINGS: Brain: No evidence of acute infarction, hemorrhage, mass, mass effect, or midline shift. No hydrocephalus or extra-axial collection. Periventricular white matter changes, likely the sequela of chronic small vessel ischemic disease. Previously noted left cerebral convexity subdural collection is no longer seen. Vascular: No hyperdense vessel. Atherosclerotic calcifications in the intracranial carotid and vertebral arteries. Skull: Negative for fracture or focal lesion. Sinuses/Orbits: No acute finding. Other: The mastoid air cells are well aerated. ASPECTS [facility] Stroke Program Early CT Score) - Ganglionic level infarction (caudate, lentiform nuclei, internal capsule, insula, M1-M3 cortex): 7 - Supraganglionic infarction (M4-M6 cortex): 3 Total score (0-10 with 10 being normal): 10 IMPRESSION: 1. No acute intracranial process. 2. ASPECTS is 10. Imaging results were communicated on 02/22/2023 at 11:41 pm to provider Dr. Otelia Limes via secure text paging. Electronically Signed   By: Wiliam Ke M.D.   On: 02/22/2023 23:41    Pending Labs Unresulted Labs (From admission, onward)     Start     Ordered   02/24/23 0500  Basic metabolic panel  Daily,   R      02/23/23 0843   02/23/23 0500  CBC  (Labs)  Daily,   R  02/23/23 0055   02/23/23 0055  Urinalysis, Complete w Microscopic -Urine, Clean Catch  Once,   R       Question:  Specimen Source  Answer:  Urine, Clean Catch   02/23/23 0055   02/23/23 0055  Sodium, urine, random  Once,   R        02/23/23 0055   02/23/23 0055  Creatinine, urine, random  Once,   R        02/23/23 0055   02/23/23 0055  Urea nitrogen, urine  Once,   R        02/23/23 0055            Vitals/Pain Today's Vitals   02/23/23 0729 02/23/23 0735 02/23/23 0743 02/23/23 1120  BP: (!) 140/62 (!) 160/97  (!) 170/95  Pulse: 73 60  (!) 59  Resp: 15 16  20    Temp:   98.6 F (37 C) 97.8 F (36.6 C)  TempSrc:   Oral Oral  SpO2: 96% 100%  100%  Weight:      PainSc:        Isolation Precautions No active isolations  Medications Medications  aspirin chewable tablet 81 mg (81 mg Oral Given 02/23/23 1147)   stroke: early stages of recovery book (has no administration in time range)  acetaminophen (TYLENOL) tablet 650 mg (has no administration in time range)    Or  acetaminophen (TYLENOL) 160 MG/5ML solution 650 mg (has no administration in time range)    Or  acetaminophen (TYLENOL) suppository 650 mg (has no administration in time range)  senna-docusate (Senokot-S) tablet 1 tablet (has no administration in time range)  heparin injection 5,000 Units (5,000 Units Subcutaneous Given 02/23/23 0629)  sodium bicarbonate 150 mEq in sterile water 1,150 mL infusion ( Intravenous New Bag/Given 02/23/23 1151)  sodium chloride flush (NS) 0.9 % injection 3 mL (3 mLs Intravenous Given 02/23/23 0016)  sodium chloride 0.9 % bolus 500 mL (0 mLs Intravenous Stopped 02/23/23 0342)  aspirin suppository 300 mg (300 mg Rectal Given 02/23/23 0131)  cefTRIAXone (ROCEPHIN) 1 g in sodium chloride 0.9 % 100 mL IVPB (0 g Intravenous Stopped 02/23/23 1024)    Mobility non-ambulatory     Focused Assessments Neuro Assessment Handoff:  Swallow screen pass? No  Cardiac Rhythm: Sinus bradycardia NIH Stroke Scale  Dizziness Present: No Headache Present: No Interval: Initial Level of Consciousness (1a.)   : Not alert, but arousable by minor stimulation to obey, answer, or respond LOC Questions (1b. )   : Answers both questions correctly LOC Commands (1c. )   : Performs both tasks correctly Best Gaze (2. )  : Forced deviation Visual (3. )  : Complete hemianopia Facial Palsy (4. )    : Complete paralysis of one or both sides Motor Arm, Left (5a. )   : No movement Motor Arm, Right (5b. ) : No drift Motor Leg, Left (6a. )  : No movement Motor Leg, Right (6b. ) :  Drift Limb Ataxia (7. ): Absent Sensory (8. )  : Severe to total sensory loss, patient is not aware of being touched in the face, arm, and leg Best Language (9. )  : Severe aphasia Dysarthria (10. ): Mild-to-moderate dysarthria, patient slurs at least some words and, at worst, can be understood with some difficulty Extinction/Inattention (11.)   : No Abnormality Complete NIHSS TOTAL: 22 Last date known well: 02/22/23 Last time known well: 2215 Neuro Assessment:   Neuro Checks:   Initial (02/23/23  0000)  Has TPA been given? No If patient is a Neuro Trauma and patient is going to OR before floor call report to 4N Charge nurse: [phone] or [phone]   R Recommendations: See Admitting Provider Note  Report given to:   Additional Notes: Family said patient is normal alert and orient. Patient is weak on left, unable to stand . He is a high fall risk

## 2023-02-23 NOTE — ED Triage Notes (Signed)
Patient comes in code stroke. Last known well 2215. Caregiver states that he was fine and came back from the bathroom saying he was very tired. Patient then slumped to the left side, unable to move left arm, and had slurred speech. The patient came in Code Stroke from [facility]. Patient able to answer questions but has left sided deficits.

## 2023-02-23 NOTE — Progress Notes (Signed)
*  PRELIMINARY RESULTS* Echocardiogram 2D Echocardiogram has been performed.  Howard Harmon 02/23/2023, 3:28 PM

## 2023-02-23 NOTE — H&P (Signed)
History and Physical    Howard Harmon NFA:[id number] DOB: 06-09-1929 DOA: 02/22/2023  PCP: Danella Penton, MD   Patient coming from: Home   Chief Complaint: Left sided weakness, speech difficulty   HPI: Howard Harmon is a 89 y.o. male with medical history significant for sinoatrial node dysfunction with pacemaker, hypertension, CKD 3B, history of DVT, atrial fibrillation, and traumatic SDH in January who presents with acute onset of left-sided weakness and dysarthria.  The patient's caretaker is at the bedside and assists with the history.  The caretaker had last seen the patient 1 week ago before arriving to care for him at around 10 PM last night.  The patient seemed much more fatigued than usual when the caretaker arrived, but was awake and oriented at that time.  Shortly after this, the patient was noted to slump to his left side, appeared unable to move his left arm, was disoriented briefly, and had slurred speech.  The patient denies chest pain, palpitations, headache, recent fever or chills, dysuria, or flank pain.  ED Course: Upon arrival to the ED, patient is found to be saturating well on room air with normal heart rate and stable blood pressure.  EKG demonstrates sinus rhythm and head CT was negative for acute intracranial abnormality.  Labs are most notable for BUN 67, potassium 5.5, creatinine 4.18, WBC 12,300, and hemoglobin 9.2.  Patient was evaluated by neurology in the ED and treated with 500 mL of normal saline and 300 mg aspirin PR.  Review of Systems:  All other systems reviewed and apart from HPI, are negative.  Past Medical History:  Diagnosis Date   Arthritis    Atrial fibrillation (HCC)    Blood clotting tendency (HCC)    Chronic kidney disease    DVT (deep venous thrombosis) (HCC)    Hypertension    Melanoma (HCC)    Pacemaker     Past Surgical History:  Procedure Laterality Date   CHOLECYSTECTOMY     COLONOSCOPY WITH PROPOFOL N/A 10/15/2014    Procedure: COLONOSCOPY WITH PROPOFOL;  Surgeon: Elnita Maxwell, MD;  Location: [facility] ENDOSCOPY;  Service: Endoscopy;  Laterality: N/A;   COLONOSCOPY WITH PROPOFOL N/A 10/23/2017   Procedure: COLONOSCOPY WITH PROPOFOL;  Surgeon: Midge Minium, MD;  Location: [facility] ENDOSCOPY;  Service: Endoscopy;  Laterality: N/A;   ESOPHAGOGASTRODUODENOSCOPY N/A 10/24/2017   Procedure: ESOPHAGOGASTRODUODENOSCOPY (EGD);  Surgeon: Pasty Spillers, MD;  Location: [facility] ENDOSCOPY;  Service: Endoscopy;  Laterality: N/A;   INSERT / REPLACE / REMOVE PACEMAKER      Social History:   reports that he has never smoked. He has never used smokeless tobacco. He reports that he does not drink alcohol and does not use drugs.  Allergies  Allergen Reactions   Warfarin Other (See Comments)    Bleeding diff to stop    Family History  Problem Relation Age of Onset   CAD Father    Prostate cancer Brother    Breast cancer Sister      Prior to Admission medications   Medication Sig Start Date End Date Taking? Authorizing Provider  acetaminophen (TYLENOL) 500 MG tablet Take 500 mg by mouth at bedtime as needed for moderate pain.    [provider]  aspirin 81 MG chewable tablet Chew 81 mg by mouth daily.    [provider]  Cholecalciferol (VITAMIN D3 PO) Take 1 tablet by mouth daily.    [provider]  diclofenac Sodium (VOLTAREN) 1 % GEL Apply 2 g  topically 4 (four) times daily. 11/27/20   Joni Reining, PA-C  docusate sodium (COLACE) 100 MG capsule Take 100-200 mg by mouth 2 (two) times daily.     [provider]  furosemide (LASIX) 20 MG tablet Take 20 mg by mouth daily as needed for fluid.     [provider]  LACTASE ENZYME PO Take 1 tablet by mouth as needed (for indigestion).     [provider]  omega-3 acid ethyl esters (LOVAZA) 1 g capsule Take 1-2 g by mouth 2 (two) times daily.    [provider]  RABEprazole (ACIPHEX) 20 MG tablet Take 20 mg  by mouth daily.     [provider]  SENNA LEAVES PO Take 1 capsule by mouth 2 (two) times a day.     [provider]  tetrahydrozoline-zinc (VISINE-AC) 0.05-0.25 % ophthalmic solution Place 1 drop into both eyes 3 (three) times daily as needed.    [provider]  traMADol (ULTRAM) 50 MG tablet Take 1 tablet (50 mg total) by mouth every 6 (six) hours as needed for moderate pain. 10/26/18   Shaune Pollack, MD  vitamin B-12 (CYANOCOBALAMIN) 1000 MCG tablet Take 1,000 mcg by mouth daily.    [provider]    Physical Exam: Vitals:   02/23/23 0000 02/23/23 0015 02/23/23 0030 02/23/23 0045  BP: (!) 134/91 (!) 148/77 (!) 144/86 (!) 155/85  Pulse: 69 (!) 59 (!) 59 60  Resp: 13 18 18 14   SpO2: 98% 100% 100% 100%  Weight:         Constitutional: NAD, no pallor or diaphoresis Eyes: PERTLA, lids and conjunctivae normal  ENMT: Mucous membranes are moist. Posterior pharynx clear of any exudate or lesions.   Neck: supple, no masses  Respiratory: no wheezing, no crackles. No accessory muscle use.  Cardiovascular: S1 & S2 heard, regular rate and rhythm. No JVD. Abdomen: No distension, no tenderness, soft. Bowel sounds active.  Musculoskeletal: no clubbing / cyanosis. No joint deformity upper and lower extremities.   Skin: no significant rashes, lesions, ulcers. Warm, dry, well-perfused. Neurologic: PERRL, dysarthria, no gross facial asymmetry. Strength 0-1/5 throughout left side and 4-5/5 on right. Sleeping, wakes to voice and is oriented.  Psychiatric: Calm. Cooperative.    Labs and Imaging on Admission: I have personally reviewed following labs and imaging studies  CBC: Recent Labs  Lab 02/22/23 2334 02/22/23 2336  WBC 12.3*  --   NEUTROABS 8.9*  --   HGB 9.2* 9.2*  HCT 28.8* 27.0*  MCV 107.1*  --   PLT 172  --    Basic Metabolic Panel: Recent Labs  Lab 02/22/23 2334 02/22/23 2336  NA 145 145  K 5.5* 5.4*  CL 113* 116*  CO2 18*  --   GLUCOSE  107* 101*  BUN 61* 56*  CREATININE 4.18* 4.70*  CALCIUM 8.7*  --    GFR: CrCl cannot be calculated (Unknown ideal weight.). Liver Function Tests: Recent Labs  Lab 02/22/23 2334  AST 24  ALT 23  ALKPHOS 62  BILITOT 0.6  PROT 6.2*  ALBUMIN 2.6*   No results for input(s): "LIPASE", "AMYLASE" in the last 168 hours. No results for input(s): "AMMONIA" in the last 168 hours. Coagulation Profile: Recent Labs  Lab 02/22/23 2334  INR 1.3*   Cardiac Enzymes: No results for input(s): "CKTOTAL", "CKMB", "CKMBINDEX", "TROPONINI" in the last 168 hours. BNP (last 3 results) No results for input(s): "PROBNP" in the last 8760 hours. HbA1C: No results  for input(s): "HGBA1C" in the last 72 hours. CBG: Recent Labs  Lab 02/23/23 0015  GLUCAP 98   Lipid Profile: No results for input(s): "CHOL", "HDL", "LDLCALC", "TRIG", "CHOLHDL", "LDLDIRECT" in the last 72 hours. Thyroid Function Tests: No results for input(s): "TSH", "T4TOTAL", "FREET4", "T3FREE", "THYROIDAB" in the last 72 hours. Anemia Panel: No results for input(s): "VITAMINB12", "FOLATE", "FERRITIN", "TIBC", "IRON", "RETICCTPCT" in the last 72 hours. Urine analysis:    Component Value Date/Time   COLORURINE YELLOW (A) 10/24/2018 0118   APPEARANCEUR HAZY (A) 10/24/2018 0118   APPEARANCEUR Clear 09/18/2012 2138   LABSPEC 1.014 10/24/2018 0118   LABSPEC 1.014 09/18/2012 2138   PHURINE 5.0 10/24/2018 0118   GLUCOSEU NEGATIVE 10/24/2018 0118   GLUCOSEU Negative 09/18/2012 2138   HGBUR MODERATE (A) 10/24/2018 0118   BILIRUBINUR NEGATIVE 10/24/2018 0118   BILIRUBINUR Negative 09/18/2012 2138   KETONESUR NEGATIVE 10/24/2018 0118   PROTEINUR NEGATIVE 10/24/2018 0118   NITRITE NEGATIVE 10/24/2018 0118   LEUKOCYTESUR SMALL (A) 10/24/2018 0118   LEUKOCYTESUR Trace 09/18/2012 2138   Sepsis Labs: @LABRCNTIP (procalcitonin:4,lacticidven:4) )No results found for this or any previous visit (from the past 240 hour(s)).   Radiological  Exams on Admission: CT HEAD CODE STROKE WO CONTRAST  Result Date: 02/22/2023 CLINICAL DATA:  Code stroke.  Stroke suspected EXAM: CT HEAD WITHOUT CONTRAST TECHNIQUE: Contiguous axial images were obtained from the base of the skull through the vertex without intravenous contrast. RADIATION DOSE REDUCTION: This exam was performed according to the departmental dose-optimization program which includes automated exposure control, adjustment of the mA and/or kV according to patient size and/or use of iterative reconstruction technique. COMPARISON:  06/09/2021 FINDINGS: Brain: No evidence of acute infarction, hemorrhage, mass, mass effect, or midline shift. No hydrocephalus or extra-axial collection. Periventricular white matter changes, likely the sequela of chronic small vessel ischemic disease. Previously noted left cerebral convexity subdural collection is no longer seen. Vascular: No hyperdense vessel. Atherosclerotic calcifications in the intracranial carotid and vertebral arteries. Skull: Negative for fracture or focal lesion. Sinuses/Orbits: No acute finding. Other: The mastoid air cells are well aerated. ASPECTS [facility] Stroke Program Early CT Score) - Ganglionic level infarction (caudate, lentiform nuclei, internal capsule, insula, M1-M3 cortex): 7 - Supraganglionic infarction (M4-M6 cortex): 3 Total score (0-10 with 10 being normal): 10 IMPRESSION: 1. No acute intracranial process. 2. ASPECTS is 10. Imaging results were communicated on 02/22/2023 at 11:41 pm to provider Dr. Otelia Limes via secure text paging. Electronically Signed   By: Wiliam Ke M.D.   On: 02/22/2023 23:41    EKG: Independently reviewed. Sinus rhythm, 1st degree AV block, non-specific IVCD, LAD, LVH with repolarization abnormality.   Assessment/Plan   1. Left-sided weakness and dysarthria; suspected acute CVA  - Appreciate neurology consultation  - Continue cardiac monitoring, frequent neuro checks, and NPO pending swallow screen,  check MRI brain, MRA head and neck, A1c, and lipids, give 300 mg ASA PR now and then 81 mg daily, consult PT/OT/SLP    2. AKI superimposed on CKD 3B; hyperkalemia; metabolic acidosis  - Check UA, urine chemistries, and renal US  - Hold Lasix, hydrate with isotonic bicarbonate, renally-dose medications, repeat chem panel in am    3. Atrial fibrillation  - In SR on admission, not anticoagulate pta  - Continue aspirin     DVT prophylaxis: sq heparin  Code Status: Full  Level of Care: Level of care: Telemetry Medical Family Communication: Caretaker at bedside   Disposition Plan:  Patient is from: home  Anticipated d/c  is to: TBD Anticipated d/c date is: 02/25/23  Patient currently: Pending CVA workup, improved renal function, therapy assessments  Consults called: Neurology  Admission status: Inpatient     Briscoe Deutscher, MD [facility]  02/23/2023, 12:55 AM

## 2023-02-23 NOTE — Plan of Care (Signed)
New admission

## 2023-02-23 NOTE — Code Documentation (Signed)
Stroke Response Nurse Documentation Code Documentation  Howard Harmon is a 89 y.o. male arriving to [facility]  via [facility] on 9/19 with past medical hx of HTN, DVT, afib, melanoma, CKD. On No antithrombotic. Code stroke was activated by EMS.   Patient from home where he was LKW at 2215 and now complaining of left sided weakness and right gaze, slurred speech.   Stroke team at the bedside on patient arrival. Labs drawn and patient cleared for CT by Dr. Pilar Plate. Patient to CT with team. NIHSS 18, see documentation for details and code stroke times. Patient with decreased LOC, right gaze preference , left hemianopia, left arm weakness, bilateral leg weakness, left decreased sensation, and dysarthria  on exam. The following imaging was completed:  CT Head and MRI. Patient is not a candidate for IV Thrombolytic due to prior ICH. Patient is not a candidate for IR due to low MRS.   Care Plan: MRI.   Bedside handoff with ED RN Alycia Rossetti.    Rose Fillers  Rapid Response RN

## 2023-02-23 NOTE — Progress Notes (Addendum)
New admit from the ed. Patient is alert to self only. Mae's x4. Left sided weakness and deficits noted. Left facial drooping noted. Left leg rotates inward and when trying to move left arm turns in. Patient aware of self. No family with patient. Bottom pink blanchable mepiplex applied. Patient has many scattered bruises noted on armas and legs especially the left side. Patient does not appear to be able to see out of the left eye. Right eye blinks and protects. Patient has a scab area on left great toe. Patient has a PPM. MP shows paced.

## 2023-02-23 NOTE — Evaluation (Signed)
Physical Therapy Evaluation Patient Details Name: Howard Harmon MRN: [id number] DOB: 27-Mar-1930 Today's Date: 02/23/2023  History of Present Illness  89 y.o. male presents to [facility] hospital on 02/22/2023 with L weakness and dysarthria. CT head negative, further imaging pending. PMH includes OA, afib, CKD, HTN, melanoma, PPM.  Clinical Impression  Pt presents to PT with deficits in functional mobility, balance, strength, power, endurance, cognition, awareness. Pt with L neglect during session, does not track left of midline at this time. Pt's L side is flaccid, requiring significant physical assistance to perform functional mobility and to maintain sitting balance. Pt will benefit from frequent attempts at mobilization in an effort to improve balance and functional mobility quality. PT provides education to family on increasing pt awareness to left side. PT recommends short term inpatient PT services at the time of discharge, pending family goals of care.        If plan is discharge home, recommend the following: Two people to help with walking and/or transfers;Two people to help with bathing/dressing/bathroom;Assistance with cooking/housework;Assistance with feeding;Direct supervision/assist for medications management;Direct supervision/assist for financial management;Assist for transportation;Help with stairs or ramp for entrance;Supervision due to cognitive status   Can travel by private vehicle   No    Equipment Recommendations Hospital bed;Hoyer lift  Recommendations for Other Services       Functional Status Assessment Patient has had a recent decline in their functional status and demonstrates the ability to make significant improvements in function in a reasonable and predictable amount of time.     Precautions / Restrictions Precautions Precautions: Fall Precaution Comments: L neglect Restrictions Weight Bearing Restrictions: No      Mobility  Bed Mobility Overal bed  mobility: Needs Assistance Bed Mobility: Supine to Sit, Rolling, Sit to Supine Rolling: Max assist   Supine to sit: Max assist, HOB elevated Sit to supine: Total assist, +2 for physical assistance        Transfers Overall transfer level: Needs assistance Equipment used: 1 person hand held assist Transfers: Sit to/from Stand Sit to Stand: Total assist, From elevated surface                Ambulation/Gait                  Stairs            Wheelchair Mobility     Tilt Bed    Modified Rankin (Stroke Patients Only)       Balance Overall balance assessment: Needs assistance Sitting-balance support: Single extremity supported, Feet supported Sitting balance-Leahy Scale: Poor Sitting balance - Comments: mod-maxA, posterior lean initially, later with some pushing noted to L side Postural control: Posterior lean, Left lateral lean Standing balance support: Bilateral upper extremity supported, Reliant on assistive device for balance Standing balance-Leahy Scale: Zero Standing balance comment: totalA                             Pertinent Vitals/Pain Pain Assessment Pain Assessment: No/denies pain    Home Living Family/patient expects to be discharged to:: Private residence Living Arrangements: Alone Available Help at Discharge: Family;Available PRN/intermittently;Other (Comment) (hospice services overnight) Type of Home: House Home Access: Stairs to enter   Entrance Stairs-Number of Steps: 3   Home Layout: One level Home Equipment: Rollator (4 wheels);Wheelchair - manual;BSC/3in1      Prior Function Prior Level of Function : Needs assist  Mobility Comments: transferring from bed to wheelchair/recliner, no ambulation in the last month ADLs Comments: assist for all ADL/IADLs     Extremity/Trunk Assessment   Upper Extremity Assessment Upper Extremity Assessment: LUE deficits/detail LUE Deficits / Details: LUE is  flacid at this time, PROM WFL LUE Sensation: decreased light touch;decreased proprioception    Lower Extremity Assessment Lower Extremity Assessment: LLE deficits/detail LLE Deficits / Details: no AROM noted from LLE, PROM WFL LLE Sensation: decreased light touch    Cervical / Trunk Assessment Cervical / Trunk Assessment: Kyphotic (head flexed and laterally tilted to R)  Communication   Communication Communication: Other (comment) (dysarthria) Cueing Techniques: Verbal cues;Tactile cues  Cognition Arousal: Alert Behavior During Therapy: Impulsive Overall Cognitive Status: Impaired/Different from baseline Area of Impairment: Orientation, Attention, Memory, Following commands, Safety/judgement, Awareness, Problem solving                 Orientation Level: Disoriented to, Place, Time, Situation Current Attention Level: Focused Memory: Decreased short-term memory, Decreased recall of precautions Following Commands: Follows one step commands with increased time, Follows multi-step commands inconsistently Safety/Judgement: Decreased awareness of safety, Decreased awareness of deficits Awareness: Intellectual Problem Solving: Slow processing, Difficulty sequencing, Requires verbal cues General Comments: left neglect        General Comments General comments (skin integrity, edema, etc.): VSS on RA    Exercises     Assessment/Plan    PT Assessment Patient needs continued PT services  PT Problem List Decreased strength;Decreased activity tolerance;Decreased balance;Decreased mobility;Decreased knowledge of use of DME;Decreased cognition;Decreased safety awareness;Decreased knowledge of precautions;Impaired sensation;Impaired tone       PT Treatment Interventions DME instruction;Functional mobility training;Therapeutic activities;Therapeutic exercise;Balance training;Neuromuscular re-education;Cognitive remediation;Patient/family education;Wheelchair mobility training    PT  Goals (Current goals can be found in the Care Plan section)  Acute Rehab PT Goals Patient Stated Goal: to improve functional mobility and awareness PT Goal Formulation: With family Time For Goal Achievement: 03/09/23 Potential to Achieve Goals: Fair    Frequency Min 1X/week     Co-evaluation               AM-PAC PT "6 Clicks" Mobility  Outcome Measure Help needed turning from your back to your side while in a flat bed without using bedrails?: A Lot Help needed moving from lying on your back to sitting on the side of a flat bed without using bedrails?: A Lot Help needed moving to and from a bed to a chair (including a wheelchair)?: Total Help needed standing up from a chair using your arms (e.g., wheelchair or bedside chair)?: Total Help needed to walk in hospital room?: Total Help needed climbing 3-5 steps with a railing? : Total 6 Click Score: 8    End of Session Equipment Utilized During Treatment: Gait belt Activity Tolerance: Patient tolerated treatment well Patient left: in bed;with call bell/phone within reach;with family/visitor present Nurse Communication: Mobility status;Need for lift equipment PT Visit Diagnosis: Other abnormalities of gait and mobility (R26.89);Muscle weakness (generalized) (M62.81);Hemiplegia and hemiparesis Hemiplegia - Right/Left: Left Hemiplegia - dominant/non-dominant: Non-dominant Hemiplegia - caused by:  (pending MRI)    Time: 8413-2440 PT Time Calculation (min) (ACUTE ONLY): 20 min   Charges:   PT Evaluation $PT Eval Moderate Complexity: 1 Mod   PT General Charges $$ ACUTE PT VISIT: 1 Visit         Arlyss Gandy, PT, DPT Acute Rehabilitation Office [phone]   Arlyss Gandy 02/23/2023, 1:06 PM

## 2023-02-23 NOTE — ED Notes (Addendum)
When getting the patients vitals he continued to bite down on the thermometer as if it was a straw and I was able to get one underneath his arm.

## 2023-02-23 NOTE — Progress Notes (Signed)
Interim progress note not for billing  Admitted earlier this morning by my colleague. I have seen and examined the patient, reviewed the chart. Agree with h and p unless otherwise stipulated.  58m hx dementia, ckd 3b, has pacemaker, hx dvt/PE not anticoagulated 2/2 hx GI bleed and subdural, presenting with left hemiplegia slurred speech and right gaze deviation concerning for cva. CT head negative. Neuro is following and stroke w/u with mri/mra, tte is underway. Pt/ot/slp consults have been ordered and patient received aspirin. Also found to have aki with hyperkalemia and renal u/s with distended bladder with severe bilateral hydro so will have nursing place foley for urinary retention as this is probably the cause of his AKI. Will monitor electrolytes closely, AM labs are pending and nursing is aware they need to be drawn. Will f/u ua and will give a dose of ceftriaxone for empiric uti treatment. Confirmed with daughter he is full code but she is aware of his frailty and poor long term prognosis and will discuss code status with her siblings.

## 2023-02-24 DIAGNOSIS — I480 Paroxysmal atrial fibrillation: Secondary | ICD-10-CM | POA: Diagnosis not present

## 2023-02-24 DIAGNOSIS — N170 Acute kidney failure with tubular necrosis: Secondary | ICD-10-CM | POA: Diagnosis not present

## 2023-02-24 DIAGNOSIS — Z515 Encounter for palliative care: Secondary | ICD-10-CM

## 2023-02-24 DIAGNOSIS — I639 Cerebral infarction, unspecified: Secondary | ICD-10-CM | POA: Diagnosis not present

## 2023-02-24 DIAGNOSIS — Z789 Other specified health status: Secondary | ICD-10-CM

## 2023-02-24 DIAGNOSIS — R451 Restlessness and agitation: Secondary | ICD-10-CM

## 2023-02-24 DIAGNOSIS — J9611 Chronic respiratory failure with hypoxia: Secondary | ICD-10-CM | POA: Diagnosis not present

## 2023-02-24 DIAGNOSIS — N1832 Chronic kidney disease, stage 3b: Secondary | ICD-10-CM

## 2023-02-24 DIAGNOSIS — Z7189 Other specified counseling: Secondary | ICD-10-CM

## 2023-02-24 DIAGNOSIS — R531 Weakness: Secondary | ICD-10-CM | POA: Diagnosis not present

## 2023-02-24 DIAGNOSIS — Z66 Do not resuscitate: Secondary | ICD-10-CM

## 2023-02-24 LAB — BASIC METABOLIC PANEL
Anion gap: 13 (ref 5–15)
BUN: 52 mg/dL — ABNORMAL HIGH (ref 8–23)
CO2: 20 mmol/L — ABNORMAL LOW (ref 22–32)
Calcium: 8.5 mg/dL — ABNORMAL LOW (ref 8.9–10.3)
Chloride: 111 mmol/L (ref 98–111)
Creatinine, Ser: 3.29 mg/dL — ABNORMAL HIGH (ref 0.61–1.24)
GFR, Estimated: 17 mL/min — ABNORMAL LOW (ref >60)
Glucose, Bld: 100 mg/dL — ABNORMAL HIGH (ref 70–99)
Potassium: 4.9 mmol/L (ref 3.5–5.1)
Sodium: 144 mmol/L (ref 135–145)

## 2023-02-24 LAB — CBC
HCT: 28.4 % — ABNORMAL LOW (ref 39.0–52.0)
Hemoglobin: 9 g/dL — ABNORMAL LOW (ref 13.0–17.0)
MCH: 32.1 pg (ref 26.0–34.0)
MCHC: 31.7 g/dL (ref 30.0–36.0)
MCV: 101.4 fL — ABNORMAL HIGH (ref 80.0–100.0)
Platelets: 186 10*3/uL (ref 150–400)
RBC: 2.8 MIL/uL — ABNORMAL LOW (ref 4.22–5.81)
RDW: 14.6 % (ref 11.5–15.5)
WBC: 9.2 10*3/uL (ref 4.0–10.5)
nRBC: 0 % (ref 0.0–0.2)

## 2023-02-24 MED ORDER — HYDROMORPHONE HCL 1 MG/ML IJ SOLN
0.5000 mg | INTRAMUSCULAR | Status: DC | PRN
  Administered 2023-02-25 (×3): 0.5 mg via INTRAVENOUS
  Filled 2023-02-24 (×3): qty 0.5

## 2023-02-24 MED ORDER — ONDANSETRON 4 MG PO TBDP: 4.0000 mg | ORAL_TABLET | Freq: Four times a day (QID) | ORAL | Status: DC | PRN

## 2023-02-24 MED ORDER — LORAZEPAM 1 MG PO TABS: 1.0000 mg | ORAL_TABLET | ORAL | Status: DC | PRN

## 2023-02-24 MED ORDER — HALOPERIDOL LACTATE 5 MG/ML IJ SOLN: 0.5000 mg | INTRAMUSCULAR | Status: DC | PRN

## 2023-02-24 MED ORDER — LORAZEPAM 2 MG/ML PO CONC: 1.0000 mg | ORAL | Status: DC | PRN

## 2023-02-24 MED ORDER — SODIUM CHLORIDE 0.9 % IV SOLN: 250.0000 mL | INTRAVENOUS | Status: DC | PRN

## 2023-02-24 MED ORDER — HALOPERIDOL LACTATE 5 MG/ML IJ SOLN: 2.0000 mg | Freq: Four times a day (QID) | INTRAMUSCULAR | Status: DC | PRN

## 2023-02-24 MED ORDER — HALOPERIDOL LACTATE 2 MG/ML PO CONC: 0.5000 mg | ORAL | Status: DC | PRN

## 2023-02-24 MED ORDER — SODIUM CHLORIDE 0.9% FLUSH
3.0000 mL | Freq: Two times a day (BID) | INTRAVENOUS | Status: DC
  Administered 2023-02-24: 3 mL via INTRAVENOUS

## 2023-02-24 MED ORDER — HALOPERIDOL 1 MG PO TABS: 2.0000 mg | ORAL_TABLET | Freq: Four times a day (QID) | ORAL | Status: DC | PRN

## 2023-02-24 MED ORDER — HALOPERIDOL LACTATE 2 MG/ML PO CONC: 2.0000 mg | Freq: Four times a day (QID) | ORAL | Status: DC | PRN

## 2023-02-24 MED ORDER — POLYVINYL ALCOHOL 1.4 % OP SOLN: 1.0000 [drp] | OPHTHALMIC | Status: DC | PRN

## 2023-02-24 MED ORDER — BIOTENE DRY MOUTH MT LIQD
15.0000 mL | Freq: Two times a day (BID) | OROMUCOSAL | Status: DC
  Administered 2023-02-24 – 2023-02-25 (×2): 15 mL via OROMUCOSAL

## 2023-02-24 MED ORDER — ACETAMINOPHEN 650 MG RE SUPP: 650.0000 mg | Freq: Four times a day (QID) | RECTAL | Status: DC | PRN

## 2023-02-24 MED ORDER — SODIUM CHLORIDE 0.9% FLUSH: 3.0000 mL | INTRAVENOUS | Status: DC | PRN

## 2023-02-24 MED ORDER — ONDANSETRON HCL 4 MG/2ML IJ SOLN: 4.0000 mg | Freq: Four times a day (QID) | INTRAMUSCULAR | Status: DC | PRN

## 2023-02-24 MED ORDER — DIPHENHYDRAMINE HCL 50 MG/ML IJ SOLN: 12.5000 mg | INTRAMUSCULAR | Status: DC | PRN

## 2023-02-24 MED ORDER — GLYCOPYRROLATE 0.2 MG/ML IJ SOLN: 0.2000 mg | INTRAMUSCULAR | Status: DC | PRN

## 2023-02-24 MED ORDER — GLYCOPYRROLATE 1 MG PO TABS: 1.0000 mg | ORAL_TABLET | ORAL | Status: DC | PRN

## 2023-02-24 MED ORDER — LORAZEPAM 2 MG/ML IJ SOLN: 1.0000 mg | INTRAMUSCULAR | Status: DC | PRN

## 2023-02-24 MED ORDER — GLYCOPYRROLATE 0.2 MG/ML IJ SOLN
0.2000 mg | INTRAMUSCULAR | Status: DC | PRN
  Administered 2023-02-26: 0.2 mg via INTRAVENOUS
  Filled 2023-02-24: qty 1

## 2023-02-24 MED ORDER — OXYCODONE HCL 20 MG/ML PO CONC: 5.0000 mg | ORAL | Status: DC | PRN

## 2023-02-24 MED ORDER — ENSURE ENLIVE PO LIQD
237.0000 mL | Freq: Two times a day (BID) | ORAL | Status: DC
  Administered 2023-02-24: 237 mL via ORAL

## 2023-02-24 MED ORDER — HALOPERIDOL 0.5 MG PO TABS: 0.5000 mg | ORAL_TABLET | ORAL | Status: DC | PRN

## 2023-02-24 MED ORDER — LORAZEPAM 2 MG/ML IJ SOLN
0.5000 mg | Freq: Four times a day (QID) | INTRAMUSCULAR | Status: DC
  Administered 2023-02-24 – 2023-02-25 (×6): 0.5 mg via INTRAVENOUS
  Filled 2023-02-24 (×6): qty 1

## 2023-02-24 MED ORDER — ACETAMINOPHEN 325 MG PO TABS: 650.0000 mg | ORAL_TABLET | Freq: Four times a day (QID) | ORAL | Status: DC | PRN

## 2023-02-24 NOTE — Progress Notes (Signed)
MD coming to assess IV site that is bleeding.   Devonne Doughty Toniette Devera

## 2023-02-24 NOTE — Progress Notes (Signed)
PROGRESS NOTE        PATIENT DETAILS Name: Howard Harmon Age: 89 y.o. Sex: male Date of Birth: 03-05-1930 Admit Date: 02/22/2023 Admitting Physician Briscoe Deutscher, MD ZOX:WRUEAV, Hardin Negus, MD  Brief Summary: Patient is a 89 y.o.  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  Patient was found to have acute CVA-AKI and acute urinary retention.  See below for further details.    Significant events: 9/19>> admit to [facility] 9/20>> urology placed Foley catheter  Significant studies: 9/19>> CT head: No acute intracranial process 9/20>> renal ultrasound: Severe bilateral hydronephrosis-distended bladder 9/20>> CT head: Interim development of acute right MCA infarct.  Hyperdense distal right MCA consistent with thrombus. 9/20>> carotid Doppler: No significant stenosis 9/20>> echo: EF 50-55%.  Severe mitral valve regurgitation. 9/20>> A1c 5.6 9/20>> LDL: 49  Significant microbiology data: None  Procedures: None  Consults: Neurology Palliative care Urology  Subjective: Caregiver at bedside-daughter is on the way to the hospital-he has dysarthria-but awake/alert-continues to have dense left-sided hemiplegia.  Objective: Vitals: Blood pressure (!) 158/100, pulse 60, temperature 97.8 F (36.6 C), temperature source Axillary, resp. rate 19, weight 76.2 kg, SpO2 98%.   Exam: Gen Exam:Alert awake-not in any distress HEENT:atraumatic, normocephalic Chest: B/L clear to auscultation anteriorly CVS:S1S2 regular Abdomen:soft non tender, non distended Extremities:no edema Neurology: Left-sided hemiplegia-dysarthria Skin: no rash  Pertinent Labs/Radiology:    Latest Ref Rng & Units 02/24/2023    7:41 AM 02/23/2023    3:04 AM 02/22/2023   11:36 PM  CBC  WBC 4.0 - 10.5 K/uL 9.2  10.5    Hemoglobin 13.0 - 17.0  g/dL 9.0  9.0  9.2   Hematocrit 39.0 - 52.0 % 28.4  28.6  27.0   Platelets 150 - 400 K/uL 186  162      Lab Results  Component Value Date   NA 144 02/23/2023   K 5.6 (H) 02/23/2023   CL 109 02/23/2023   CO2 21 (L) 02/23/2023      Assessment/Plan: Acute right MCA territory infarct Likely embolic-A-fib-not on anticoagulation given prior history of SDH Continues to have dense left-sided hemiplegia/dysarthria Workup as above (pacemaker not compatible with MRI) Neurology recommending DAPT indefinitely-Per prior notes-order candidate for Watchman device. PT/OT/SLP eval pending-if felt to have severe dysphagia-May need to talk to family and delineate further goals of care-he will not do well with the PEG tube. Poor overall prognosis-will await arrival of family for goals of care discussion.  Have consulted palliative care as well.  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 labs pending this morning.  Acute urinary retention Foley catheter placed by urology on 9/20 Flomax if able when oral intake possible Per urology-to keep catheter in for at least 1 week before considering voiding trial.  Normocytic anemia Hb stable-close to baseline-likely due to CKD Follow CBC periodically  Chronic atrial fibrillation Paced rhythm Not on any anticoagulation no rate control medications prior to this hospitalization  Sick sinus syndrome-s/p pacemaker implantation Telemetry monitoring  Chronic HFpEF Euvolemic Furosemide on hold due to AKI  Palliative care  PROGRESS NOTE        PATIENT DETAILS Name: Howard Harmon Age: 89 y.o. Sex: male Date of Birth: 03-05-1930 Admit Date: 02/22/2023 Admitting Physician Briscoe Deutscher, MD ZOX:WRUEAV, Hardin Negus, MD  Brief Summary: Patient is a 89 y.o.  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  Patient was found to have acute CVA-AKI and acute urinary retention.  See below for further details.    Significant events: 9/19>> admit to [facility] 9/20>> urology placed Foley catheter  Significant studies: 9/19>> CT head: No acute intracranial process 9/20>> renal ultrasound: Severe bilateral hydronephrosis-distended bladder 9/20>> CT head: Interim development of acute right MCA infarct.  Hyperdense distal right MCA consistent with thrombus. 9/20>> carotid Doppler: No significant stenosis 9/20>> echo: EF 50-55%.  Severe mitral valve regurgitation. 9/20>> A1c 5.6 9/20>> LDL: 49  Significant microbiology data: None  Procedures: None  Consults: Neurology Palliative care Urology  Subjective: Caregiver at bedside-daughter is on the way to the hospital-he has dysarthria-but awake/alert-continues to have dense left-sided hemiplegia.  Objective: Vitals: Blood pressure (!) 158/100, pulse 60, temperature 97.8 F (36.6 C), temperature source Axillary, resp. rate 19, weight 76.2 kg, SpO2 98%.   Exam: Gen Exam:Alert awake-not in any distress HEENT:atraumatic, normocephalic Chest: B/L clear to auscultation anteriorly CVS:S1S2 regular Abdomen:soft non tender, non distended Extremities:no edema Neurology: Left-sided hemiplegia-dysarthria Skin: no rash  Pertinent Labs/Radiology:    Latest Ref Rng & Units 02/24/2023    7:41 AM 02/23/2023    3:04 AM 02/22/2023   11:36 PM  CBC  WBC 4.0 - 10.5 K/uL 9.2  10.5    Hemoglobin 13.0 - 17.0  g/dL 9.0  9.0  9.2   Hematocrit 39.0 - 52.0 % 28.4  28.6  27.0   Platelets 150 - 400 K/uL 186  162      Lab Results  Component Value Date   NA 144 02/23/2023   K 5.6 (H) 02/23/2023   CL 109 02/23/2023   CO2 21 (L) 02/23/2023      Assessment/Plan: Acute right MCA territory infarct Likely embolic-A-fib-not on anticoagulation given prior history of SDH Continues to have dense left-sided hemiplegia/dysarthria Workup as above (pacemaker not compatible with MRI) Neurology recommending DAPT indefinitely-Per prior notes-order candidate for Watchman device. PT/OT/SLP eval pending-if felt to have severe dysphagia-May need to talk to family and delineate further goals of care-he will not do well with the PEG tube. Poor overall prognosis-will await arrival of family for goals of care discussion.  Have consulted palliative care as well.  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 labs pending this morning.  Acute urinary retention Foley catheter placed by urology on 9/20 Flomax if able when oral intake possible Per urology-to keep catheter in for at least 1 week before considering voiding trial.  Normocytic anemia Hb stable-close to baseline-likely due to CKD Follow CBC periodically  Chronic atrial fibrillation Paced rhythm Not on any anticoagulation no rate control medications prior to this hospitalization  Sick sinus syndrome-s/p pacemaker implantation Telemetry monitoring  Chronic HFpEF Euvolemic Furosemide on hold due to AKI  Palliative care  PROGRESS NOTE        PATIENT DETAILS Name: Howard Harmon Age: 89 y.o. Sex: male Date of Birth: 03-05-1930 Admit Date: 02/22/2023 Admitting Physician Briscoe Deutscher, MD ZOX:WRUEAV, Hardin Negus, MD  Brief Summary: Patient is a 89 y.o.  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  Patient was found to have acute CVA-AKI and acute urinary retention.  See below for further details.    Significant events: 9/19>> admit to [facility] 9/20>> urology placed Foley catheter  Significant studies: 9/19>> CT head: No acute intracranial process 9/20>> renal ultrasound: Severe bilateral hydronephrosis-distended bladder 9/20>> CT head: Interim development of acute right MCA infarct.  Hyperdense distal right MCA consistent with thrombus. 9/20>> carotid Doppler: No significant stenosis 9/20>> echo: EF 50-55%.  Severe mitral valve regurgitation. 9/20>> A1c 5.6 9/20>> LDL: 49  Significant microbiology data: None  Procedures: None  Consults: Neurology Palliative care Urology  Subjective: Caregiver at bedside-daughter is on the way to the hospital-he has dysarthria-but awake/alert-continues to have dense left-sided hemiplegia.  Objective: Vitals: Blood pressure (!) 158/100, pulse 60, temperature 97.8 F (36.6 C), temperature source Axillary, resp. rate 19, weight 76.2 kg, SpO2 98%.   Exam: Gen Exam:Alert awake-not in any distress HEENT:atraumatic, normocephalic Chest: B/L clear to auscultation anteriorly CVS:S1S2 regular Abdomen:soft non tender, non distended Extremities:no edema Neurology: Left-sided hemiplegia-dysarthria Skin: no rash  Pertinent Labs/Radiology:    Latest Ref Rng & Units 02/24/2023    7:41 AM 02/23/2023    3:04 AM 02/22/2023   11:36 PM  CBC  WBC 4.0 - 10.5 K/uL 9.2  10.5    Hemoglobin 13.0 - 17.0  g/dL 9.0  9.0  9.2   Hematocrit 39.0 - 52.0 % 28.4  28.6  27.0   Platelets 150 - 400 K/uL 186  162      Lab Results  Component Value Date   NA 144 02/23/2023   K 5.6 (H) 02/23/2023   CL 109 02/23/2023   CO2 21 (L) 02/23/2023      Assessment/Plan: Acute right MCA territory infarct Likely embolic-A-fib-not on anticoagulation given prior history of SDH Continues to have dense left-sided hemiplegia/dysarthria Workup as above (pacemaker not compatible with MRI) Neurology recommending DAPT indefinitely-Per prior notes-order candidate for Watchman device. PT/OT/SLP eval pending-if felt to have severe dysphagia-May need to talk to family and delineate further goals of care-he will not do well with the PEG tube. Poor overall prognosis-will await arrival of family for goals of care discussion.  Have consulted palliative care as well.  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 labs pending this morning.  Acute urinary retention Foley catheter placed by urology on 9/20 Flomax if able when oral intake possible Per urology-to keep catheter in for at least 1 week before considering voiding trial.  Normocytic anemia Hb stable-close to baseline-likely due to CKD Follow CBC periodically  Chronic atrial fibrillation Paced rhythm Not on any anticoagulation no rate control medications prior to this hospitalization  Sick sinus syndrome-s/p pacemaker implantation Telemetry monitoring  Chronic HFpEF Euvolemic Furosemide on hold due to AKI  Palliative care  1.40 cm LV IVS:        1.40 cm LVOT diam:     2.00 cm LV SV:         75 LV SV Index:   39 LVOT Area:     3.14 cm  RIGHT VENTRICLE TAPSE (M-mode): 0.8 cm LEFT ATRIUM              Index        RIGHT  ATRIUM           Index LA diam:        4.70 cm  2.40 cm/m   RA Area:     22.70 cm LA Vol (A2C):   87.5 ml  44.69 ml/m  RA Volume:   67.30 ml  34.37 ml/m LA Vol (A4C):   131.0 ml 66.91 ml/m LA Biplane Vol: 112.0 ml 57.20 ml/m  AORTIC VALVE                     PULMONIC VALVE AV Area (Vmax):    1.08 cm      PV Vmax:       0.86 m/s AV Area (Vmean):   1.02 cm      PV Peak grad:  3.0 mmHg AV Area (VTI):     1.07 cm AV Vmax:           347.00 cm/s AV Vmean:          238.000 cm/s AV VTI:            0.704 m AV Peak Grad:      48.2 mmHg AV Mean Grad:      26.5 mmHg LVOT Vmax:         119.00 cm/s LVOT Vmean:        77.000 cm/s LVOT VTI:          0.240 m LVOT/AV VTI ratio: 0.34  AORTA Ao Root diam: 2.80 cm Ao Asc diam:  3.60 cm TRICUSPID VALVE TR Peak grad:   48.2 mmHg TR Vmax:        347.00 cm/s  SHUNTS Systemic VTI:  0.24 m Systemic Diam: 2.00 cm Aditya Sabharwal Electronically signed by Dorthula Nettles Signature Date/Time: 02/23/2023/3:46:33 PM    Final    VAS US CAROTID  Result Date: 02/23/2023 Carotid Arterial Duplex Study Patient Name:  Howard Harmon  Date of Exam:   02/23/2023 Medical Rec #: [id number]          Accession #:    [id number] Date of Birth: Jul 18, 1929           Patient Gender: M Patient Age:   52 years Exam Location:  [facility] Procedure:      VAS US CAROTID Referring Phys: Elmer Picker --------------------------------------------------------------------------------  Indications:   CVA and Acute onset of left sided weakness, speech difficulty. Risk Factors:  Hypertension, hyperlipidemia, coronary artery disease. Other Factors: Pacemaker, history of DVT. Performing Technologist: Marilynne Halsted RDMS, RVT  Examination Guidelines: A complete evaluation includes B-mode imaging, spectral Doppler, color Doppler, and power Doppler as needed of all accessible portions of each vessel. Bilateral testing is considered an integral part of a complete examination. Limited examinations for reoccurring  indications may be performed as noted.  Right Carotid Findings: +----------+--------+--------+--------+------------------+------------------+           PSV cm/sEDV cm/sStenosisPlaque DescriptionComments           +----------+--------+--------+--------+------------------+------------------+ CCA Prox  54      11                                                   +----------+--------+--------+--------+------------------+------------------+  1.40 cm LV IVS:        1.40 cm LVOT diam:     2.00 cm LV SV:         75 LV SV Index:   39 LVOT Area:     3.14 cm  RIGHT VENTRICLE TAPSE (M-mode): 0.8 cm LEFT ATRIUM              Index        RIGHT  ATRIUM           Index LA diam:        4.70 cm  2.40 cm/m   RA Area:     22.70 cm LA Vol (A2C):   87.5 ml  44.69 ml/m  RA Volume:   67.30 ml  34.37 ml/m LA Vol (A4C):   131.0 ml 66.91 ml/m LA Biplane Vol: 112.0 ml 57.20 ml/m  AORTIC VALVE                     PULMONIC VALVE AV Area (Vmax):    1.08 cm      PV Vmax:       0.86 m/s AV Area (Vmean):   1.02 cm      PV Peak grad:  3.0 mmHg AV Area (VTI):     1.07 cm AV Vmax:           347.00 cm/s AV Vmean:          238.000 cm/s AV VTI:            0.704 m AV Peak Grad:      48.2 mmHg AV Mean Grad:      26.5 mmHg LVOT Vmax:         119.00 cm/s LVOT Vmean:        77.000 cm/s LVOT VTI:          0.240 m LVOT/AV VTI ratio: 0.34  AORTA Ao Root diam: 2.80 cm Ao Asc diam:  3.60 cm TRICUSPID VALVE TR Peak grad:   48.2 mmHg TR Vmax:        347.00 cm/s  SHUNTS Systemic VTI:  0.24 m Systemic Diam: 2.00 cm Aditya Sabharwal Electronically signed by Dorthula Nettles Signature Date/Time: 02/23/2023/3:46:33 PM    Final    VAS US CAROTID  Result Date: 02/23/2023 Carotid Arterial Duplex Study Patient Name:  Howard Harmon  Date of Exam:   02/23/2023 Medical Rec #: [id number]          Accession #:    [id number] Date of Birth: Jul 18, 1929           Patient Gender: M Patient Age:   52 years Exam Location:  [facility] Procedure:      VAS US CAROTID Referring Phys: Elmer Picker --------------------------------------------------------------------------------  Indications:   CVA and Acute onset of left sided weakness, speech difficulty. Risk Factors:  Hypertension, hyperlipidemia, coronary artery disease. Other Factors: Pacemaker, history of DVT. Performing Technologist: Marilynne Halsted RDMS, RVT  Examination Guidelines: A complete evaluation includes B-mode imaging, spectral Doppler, color Doppler, and power Doppler as needed of all accessible portions of each vessel. Bilateral testing is considered an integral part of a complete examination. Limited examinations for reoccurring  indications may be performed as noted.  Right Carotid Findings: +----------+--------+--------+--------+------------------+------------------+           PSV cm/sEDV cm/sStenosisPlaque DescriptionComments           +----------+--------+--------+--------+------------------+------------------+ CCA Prox  54      11                                                   +----------+--------+--------+--------+------------------+------------------+  1.40 cm LV IVS:        1.40 cm LVOT diam:     2.00 cm LV SV:         75 LV SV Index:   39 LVOT Area:     3.14 cm  RIGHT VENTRICLE TAPSE (M-mode): 0.8 cm LEFT ATRIUM              Index        RIGHT  ATRIUM           Index LA diam:        4.70 cm  2.40 cm/m   RA Area:     22.70 cm LA Vol (A2C):   87.5 ml  44.69 ml/m  RA Volume:   67.30 ml  34.37 ml/m LA Vol (A4C):   131.0 ml 66.91 ml/m LA Biplane Vol: 112.0 ml 57.20 ml/m  AORTIC VALVE                     PULMONIC VALVE AV Area (Vmax):    1.08 cm      PV Vmax:       0.86 m/s AV Area (Vmean):   1.02 cm      PV Peak grad:  3.0 mmHg AV Area (VTI):     1.07 cm AV Vmax:           347.00 cm/s AV Vmean:          238.000 cm/s AV VTI:            0.704 m AV Peak Grad:      48.2 mmHg AV Mean Grad:      26.5 mmHg LVOT Vmax:         119.00 cm/s LVOT Vmean:        77.000 cm/s LVOT VTI:          0.240 m LVOT/AV VTI ratio: 0.34  AORTA Ao Root diam: 2.80 cm Ao Asc diam:  3.60 cm TRICUSPID VALVE TR Peak grad:   48.2 mmHg TR Vmax:        347.00 cm/s  SHUNTS Systemic VTI:  0.24 m Systemic Diam: 2.00 cm Aditya Sabharwal Electronically signed by Dorthula Nettles Signature Date/Time: 02/23/2023/3:46:33 PM    Final    VAS US CAROTID  Result Date: 02/23/2023 Carotid Arterial Duplex Study Patient Name:  Howard Harmon  Date of Exam:   02/23/2023 Medical Rec #: [id number]          Accession #:    [id number] Date of Birth: Jul 18, 1929           Patient Gender: M Patient Age:   52 years Exam Location:  [facility] Procedure:      VAS US CAROTID Referring Phys: Elmer Picker --------------------------------------------------------------------------------  Indications:   CVA and Acute onset of left sided weakness, speech difficulty. Risk Factors:  Hypertension, hyperlipidemia, coronary artery disease. Other Factors: Pacemaker, history of DVT. Performing Technologist: Marilynne Halsted RDMS, RVT  Examination Guidelines: A complete evaluation includes B-mode imaging, spectral Doppler, color Doppler, and power Doppler as needed of all accessible portions of each vessel. Bilateral testing is considered an integral part of a complete examination. Limited examinations for reoccurring  indications may be performed as noted.  Right Carotid Findings: +----------+--------+--------+--------+------------------+------------------+           PSV cm/sEDV cm/sStenosisPlaque DescriptionComments           +----------+--------+--------+--------+------------------+------------------+ CCA Prox  54      11                                                   +----------+--------+--------+--------+------------------+------------------+  1.40 cm LV IVS:        1.40 cm LVOT diam:     2.00 cm LV SV:         75 LV SV Index:   39 LVOT Area:     3.14 cm  RIGHT VENTRICLE TAPSE (M-mode): 0.8 cm LEFT ATRIUM              Index        RIGHT  ATRIUM           Index LA diam:        4.70 cm  2.40 cm/m   RA Area:     22.70 cm LA Vol (A2C):   87.5 ml  44.69 ml/m  RA Volume:   67.30 ml  34.37 ml/m LA Vol (A4C):   131.0 ml 66.91 ml/m LA Biplane Vol: 112.0 ml 57.20 ml/m  AORTIC VALVE                     PULMONIC VALVE AV Area (Vmax):    1.08 cm      PV Vmax:       0.86 m/s AV Area (Vmean):   1.02 cm      PV Peak grad:  3.0 mmHg AV Area (VTI):     1.07 cm AV Vmax:           347.00 cm/s AV Vmean:          238.000 cm/s AV VTI:            0.704 m AV Peak Grad:      48.2 mmHg AV Mean Grad:      26.5 mmHg LVOT Vmax:         119.00 cm/s LVOT Vmean:        77.000 cm/s LVOT VTI:          0.240 m LVOT/AV VTI ratio: 0.34  AORTA Ao Root diam: 2.80 cm Ao Asc diam:  3.60 cm TRICUSPID VALVE TR Peak grad:   48.2 mmHg TR Vmax:        347.00 cm/s  SHUNTS Systemic VTI:  0.24 m Systemic Diam: 2.00 cm Aditya Sabharwal Electronically signed by Dorthula Nettles Signature Date/Time: 02/23/2023/3:46:33 PM    Final    VAS US CAROTID  Result Date: 02/23/2023 Carotid Arterial Duplex Study Patient Name:  Howard Harmon  Date of Exam:   02/23/2023 Medical Rec #: [id number]          Accession #:    [id number] Date of Birth: Jul 18, 1929           Patient Gender: M Patient Age:   52 years Exam Location:  [facility] Procedure:      VAS US CAROTID Referring Phys: Elmer Picker --------------------------------------------------------------------------------  Indications:   CVA and Acute onset of left sided weakness, speech difficulty. Risk Factors:  Hypertension, hyperlipidemia, coronary artery disease. Other Factors: Pacemaker, history of DVT. Performing Technologist: Marilynne Halsted RDMS, RVT  Examination Guidelines: A complete evaluation includes B-mode imaging, spectral Doppler, color Doppler, and power Doppler as needed of all accessible portions of each vessel. Bilateral testing is considered an integral part of a complete examination. Limited examinations for reoccurring  indications may be performed as noted.  Right Carotid Findings: +----------+--------+--------+--------+------------------+------------------+           PSV cm/sEDV cm/sStenosisPlaque DescriptionComments           +----------+--------+--------+--------+------------------+------------------+ CCA Prox  54      11                                                   +----------+--------+--------+--------+------------------+------------------+  1.40 cm LV IVS:        1.40 cm LVOT diam:     2.00 cm LV SV:         75 LV SV Index:   39 LVOT Area:     3.14 cm  RIGHT VENTRICLE TAPSE (M-mode): 0.8 cm LEFT ATRIUM              Index        RIGHT  ATRIUM           Index LA diam:        4.70 cm  2.40 cm/m   RA Area:     22.70 cm LA Vol (A2C):   87.5 ml  44.69 ml/m  RA Volume:   67.30 ml  34.37 ml/m LA Vol (A4C):   131.0 ml 66.91 ml/m LA Biplane Vol: 112.0 ml 57.20 ml/m  AORTIC VALVE                     PULMONIC VALVE AV Area (Vmax):    1.08 cm      PV Vmax:       0.86 m/s AV Area (Vmean):   1.02 cm      PV Peak grad:  3.0 mmHg AV Area (VTI):     1.07 cm AV Vmax:           347.00 cm/s AV Vmean:          238.000 cm/s AV VTI:            0.704 m AV Peak Grad:      48.2 mmHg AV Mean Grad:      26.5 mmHg LVOT Vmax:         119.00 cm/s LVOT Vmean:        77.000 cm/s LVOT VTI:          0.240 m LVOT/AV VTI ratio: 0.34  AORTA Ao Root diam: 2.80 cm Ao Asc diam:  3.60 cm TRICUSPID VALVE TR Peak grad:   48.2 mmHg TR Vmax:        347.00 cm/s  SHUNTS Systemic VTI:  0.24 m Systemic Diam: 2.00 cm Aditya Sabharwal Electronically signed by Dorthula Nettles Signature Date/Time: 02/23/2023/3:46:33 PM    Final    VAS US CAROTID  Result Date: 02/23/2023 Carotid Arterial Duplex Study Patient Name:  Howard Harmon  Date of Exam:   02/23/2023 Medical Rec #: [id number]          Accession #:    [id number] Date of Birth: Jul 18, 1929           Patient Gender: M Patient Age:   52 years Exam Location:  [facility] Procedure:      VAS US CAROTID Referring Phys: Elmer Picker --------------------------------------------------------------------------------  Indications:   CVA and Acute onset of left sided weakness, speech difficulty. Risk Factors:  Hypertension, hyperlipidemia, coronary artery disease. Other Factors: Pacemaker, history of DVT. Performing Technologist: Marilynne Halsted RDMS, RVT  Examination Guidelines: A complete evaluation includes B-mode imaging, spectral Doppler, color Doppler, and power Doppler as needed of all accessible portions of each vessel. Bilateral testing is considered an integral part of a complete examination. Limited examinations for reoccurring  indications may be performed as noted.  Right Carotid Findings: +----------+--------+--------+--------+------------------+------------------+           PSV cm/sEDV cm/sStenosisPlaque DescriptionComments           +----------+--------+--------+--------+------------------+------------------+ CCA Prox  54      11                                                   +----------+--------+--------+--------+------------------+------------------+

## 2023-02-24 NOTE — Progress Notes (Signed)
STROKE TEAM PROGRESS NOTE   BRIEF HPI [name] is a 89 y.o. 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   INTERIM HISTORY/SUBJECTIVE Made hospice care decision today by family.  OBJECTIVE  CBC    Component Value Date/Time   WBC 9.2 02/24/2023 0741   RBC 2.80 (L) 02/24/2023 0741   HGB 9.0 (L) 02/24/2023 0741   HGB 11.3 (L) 10/02/2012 1222   HCT 28.4 (L) 02/24/2023 0741   HCT 33.6 (L) 10/02/2012 1222   PLT 186 02/24/2023 0741   PLT 206 10/02/2012 1222   MCV 101.4 (H) 02/24/2023 0741   MCV 100 10/02/2012 1222   MCH 32.1 02/24/2023 0741   MCHC 31.7 02/24/2023 0741   RDW 14.6 02/24/2023 0741   RDW 15.1 (H) 10/02/2012 1222   LYMPHSABS 1.6 02/22/2023 2334   LYMPHSABS 1.0 09/18/2012 0636   MONOABS 1.4 (H) 02/22/2023 2334   MONOABS 1.1 (H) 09/18/2012 0636   EOSABS 0.3 02/22/2023 2334   EOSABS 0.2 09/18/2012 0636   BASOSABS 0.0 02/22/2023 2334   BASOSABS 0.0 09/18/2012 0636    BMET    Component Value Date/Time   NA 144 02/24/2023 0741   NA 144 09/18/2012 0636   K 4.9 02/24/2023 0741   K 3.9 09/18/2012 0636   CL 111 02/24/2023 0741   CL 112 (H) 09/18/2012 0636   CO2 20 (L) 02/24/2023 0741   CO2 22 09/18/2012 0636   GLUCOSE 100 (H) 02/24/2023 0741   GLUCOSE 137 (H) 09/18/2012 0636   BUN 52 (H) 02/24/2023 0741   BUN 32 (H) 09/18/2012 0636   CREATININE 3.29 (H) 02/24/2023 0741   CREATININE 2.09 (H) 09/18/2012 0636   CALCIUM 8.5 (L) 02/24/2023 0741   CALCIUM 8.7 09/18/2012 0636   GFRNONAA 17 (L) 02/24/2023 0741   GFRNONAA 29 (L) 09/18/2012 0636    IMAGING past 24 hours CT HEAD WO CONTRAST ( )  Result Date: 02/23/2023 CLINICAL DATA:  Suspected stroke, according  to epic notes left-sided weakness and slurred speech EXAM: CT HEAD WITHOUT CONTRAST TECHNIQUE: Contiguous axial images were obtained from the base of the skull through the vertex without intravenous contrast. RADIATION DOSE REDUCTION: This exam was performed according to the departmental dose-optimization program which includes automated exposure control, adjustment of the mA and/or kV according to patient size and/or use of iterative reconstruction technique. COMPARISON:  Head CT 02/22/2023, 06/09/2021 FINDINGS: Brain: No hemorrhage or intracranial mass. Interim development of hypodensity with loss of gray-white matter differentiation in the right temporal lobe, series 4, image 13, in addition to hypodensity within the right insula/sub insula and right basal ganglia, consistent with acute right MCA infarct. Atrophy and advanced chronic small vessel ischemic changes of the white matter. Stable ventricle size. No mass effect or midline shift. Vascular: Carotid vascular calcification and vertebral calcification. Dolichoectasia of the left vertebral artery. Interim finding of hyperdense distal right MCA, series 4, image 12 and 13, sagittal series 6, image 21-23. Skull: No fracture or suspicious bone lesion Sinuses/Orbits: No acute finding. Other: None IMPRESSION: 1. Interim development of acute right MCA infarct involving the right temporal lobe, right insula/sub insula, and right basal ganglia. No hemorrhage identified. No significant mass effect or midline shift. 2. Interim development of  hyperdense distal right MCA consistent with thrombus. 3. Atrophy and advanced chronic small vessel ischemic changes of the white matter. Critical Value/emergent results were called by telephone at the time of interpretation on 02/23/2023 at 4:20 pm to provider Wouk, Wilfred Curtis, MD, who verbally acknowledged these results. Electronically Signed   By: Jasmine Pang M.D.   On: 02/23/2023 16:20   ECHOCARDIOGRAM COMPLETE  Result  Date: 02/23/2023    ECHOCARDIOGRAM REPORT   Patient Name:   Howard Harmon Date of Exam: 02/23/2023 Medical Rec #:  [id number]         Height:       71.0 in Accession #:    [id number]        Weight:       168.0 lb Date of Birth:  Oct 09, 1929          BSA:          1.958 m Patient Age:    89 years          BP:           170/95 mmHg Patient Gender: M                 HR:           61 bpm. Exam Location:  Inpatient Procedure: 2D Echo, Cardiac Doppler and Color Doppler Indications:    Stroke I63.9  History:        Patient has no prior history of Echocardiogram examinations.                 CKD, Aortic Valve Disease and Mitral Valve Disease; Risk                 Factors:Hypertension, Dyslipidemia and Non-Smoker.  Sonographer:    Dondra Prader RVT RCS Referring Phys: [id number] TIMOTHY S OPYD  Sonographer Comments: Patient was difficult to scan due to mental status and kept pushing Techs hand away. Unable to obtain all pictures IMPRESSIONS  1. Left ventricular ejection fraction, by estimation, is 50 to 55%. The left ventricle has low normal function. The left ventricle has no regional wall motion abnormalities.  2. Right ventricular systolic function is normal. The right ventricular size is normal.  3. Left atrial size was severely dilated.  4. Right atrial size was mild to moderately dilated.  5. The mitral valve is grossly normal. Severe mitral valve regurgitation.  6. Tricuspid valve regurgitation is mild to moderate.  7. There is severe calcifcation of the aortic valve. There is moderate thickening of the aortic valve. Aortic valve regurgitation is trivial. Mild aortic stenosis by gradients (limited evaluation). Aortic valve mean gradient measures 26.5 mmHg. FINDINGS  Left Ventricle: Left ventricular ejection fraction, by estimation, is 50 to 55%. The left ventricle has low normal function. The left ventricle has no regional wall motion abnormalities. The left ventricular internal cavity size was normal in size. There is no  left ventricular hypertrophy. Left ventricular diastolic function could not be evaluated due to atrial fibrillation. Right Ventricle: The right ventricular size is normal. No increase in right ventricular wall thickness. Right ventricular systolic function is normal. Left Atrium: Left atrial size was severely dilated. Right Atrium: Right atrial size was mild to moderately dilated. Pericardium: There is no evidence of pericardial effusion. Mitral Valve: The mitral valve is grossly normal. Mild mitral annular calcification. Severe mitral valve regurgitation. Tricuspid Valve: The tricuspid valve is grossly normal. Tricuspid valve regurgitation is mild to moderate. Aortic Valve: The aortic valve is  calcified. There is severe calcifcation of the aortic valve. There is moderate thickening of the aortic valve. There is moderate aortic valve annular calcification. Aortic valve regurgitation is trivial. Mild to moderate  aortic stenosis is present. Aortic valve mean gradient measures 26.5 mmHg. Aortic valve peak gradient measures 48.2 mmHg. Aortic valve area, by VTI measures 1.07 cm. Pulmonic Valve: The pulmonic valve was not well visualized. Pulmonic valve regurgitation is not visualized. Aorta: The aortic root is normal in size and structure. IAS/Shunts: No atrial level shunt detected by color flow Doppler. Additional Comments: A device lead is visualized.  LEFT VENTRICLE PLAX 2D LVIDd:         4.60 cm LVIDs:         3.50 cm LV PW:         1.40 cm LV IVS:        1.40 cm LVOT diam:     2.00 cm LV SV:         75 LV SV Index:   39 LVOT Area:     3.14 cm  RIGHT VENTRICLE TAPSE (M-mode): 0.8 cm LEFT ATRIUM              Index        RIGHT ATRIUM           Index LA diam:        4.70 cm  2.40 cm/m   RA Area:     22.70 cm LA Vol (A2C):   87.5 ml  44.69 ml/m  RA Volume:   67.30 ml  34.37 ml/m LA Vol (A4C):   131.0 ml 66.91 ml/m LA Biplane Vol: 112.0 ml 57.20 ml/m  AORTIC VALVE                     PULMONIC VALVE AV Area  (Vmax):    1.08 cm      PV Vmax:       0.86 m/s AV Area (Vmean):   1.02 cm      PV Peak grad:  3.0 mmHg AV Area (VTI):     1.07 cm AV Vmax:           347.00 cm/s AV Vmean:          238.000 cm/s AV VTI:            0.704 m AV Peak Grad:      48.2 mmHg AV Mean Grad:      26.5 mmHg LVOT Vmax:         119.00 cm/s LVOT Vmean:        77.000 cm/s LVOT VTI:          0.240 m LVOT/AV VTI ratio: 0.34  AORTA Ao Root diam: 2.80 cm Ao Asc diam:  3.60 cm TRICUSPID VALVE TR Peak grad:   48.2 mmHg TR Vmax:        347.00 cm/s  SHUNTS Systemic VTI:  0.24 m Systemic Diam: 2.00 cm Aditya Sabharwal Electronically signed by Dorthula Nettles Signature Date/Time: 02/23/2023/3:46:33 PM    Final     Vitals:   02/23/23 2000 02/24/23 0000 02/24/23 0411 02/24/23 0831  BP: (!) 175/88 (!) 172/93 (!) 158/100   Pulse: 60 60 60   Resp: 17 19 19    Temp: 97.8 F (36.6 C) (!) 96.7 F (35.9 C) 98.3 F (36.8 C) 97.8 F (36.6 C)  TempSrc: Oral Axillary Axillary Axillary  SpO2: 99% 99% 98%   Weight:  PHYSICAL EXAM Howard:  Alert, well-nourished, well-developed patient in no acute distress Psych:  Mood and affect appropriate for situation CV: Regular rate and rhythm on monitor Respiratory:  Regular, unlabored respirations on room air GI: Abdomen soft and nontender   Neurological Examination Mental Status: curled up in bed, leaning to the left.  Cranial Nerves: II: PERRL. XII: Dysarthric speech.  Motor/Sensory: RUE with no drift. 4/5 to resistance.  LUE 0/5 with flaccid tone RLE able to elevate antigravity without drift.  LLE drops immediately to bed with flaccid tone, but can move slightly when noxious is applied to RIGHT leg.    ASSESSMENT/PLAN  Acute Ischemic Infarct:  right MCA infarct , etiology:  afib not on anticoagulation   Code Stroke CT head No acute abnormality. ASPECTS 10.    Not able to have MRI due to incompatible pacer Repeat CT Head: Interim development of acute right MCA infarct involving  the right temporal lobe, right insula/sub insula, and right basal ganglia. Interim development of hyperdense distal right MCA consistent with thrombus. Atrophy and advanced chronic small vessel ischemic changes of the white matter. Carotid Doppler unremarkable  2D Echo EF 50-55%, LA severely dilated, RA mild/mod dilated  LDL 49 HgbA1c 5.6 VTE prophylaxis - Heparin 5000u No antithrombotic prior to admission,  Therapy recommendations: hospice. Disposition:  hospice.  Hx of SDH Traumatic subdural hematoma in January   Atrial fibrillation  Home Meds: no anticoagulation Hx of GI bleed with coumadin Hx of traumatic SDH with fall as above Not candidate for watchman either  Hypertension Congestive Heart Failure  S/p PPM Home meds:  Lasix Stable Permissive hypertension for 24-48 hours and then gradually normalize BP in 2-3 days  Lipid management LDL 49, goal less than 70   CKD stage IV with AKI Urinary retention, bladder distention, severe bilateral hydronephrosis  UTI Hyperkalemia  Cr 4.70 -> 3.94 GFR 13 K 5.5 -> 5.4 UA Pending Abx- ceftriaxone  Urology placed foley  Dysphagia NPO-hospice.  Other stroke risk factors Advanced age Hx of DVT  Other Active Problems Melanoma   Hospital day # 1  Pt is now hospice care. Neurology will sign off.      To contact Stroke Continuity provider, please refer to [email or web address]. After hours, contact [facility]

## 2023-02-24 NOTE — Plan of Care (Signed)
Patient made comfort care today family at bedside.

## 2023-02-24 NOTE — Progress Notes (Signed)
Speech Language Pathology Treatment: Dysphagia  Patient Details Name: Howard Harmon MRN: [id number] DOB: August 11, 1929 Today's Date: 02/24/2023 Time: 4098-1191 SLP Time Calculation (min) (ACUTE ONLY): 8 min  Assessment / Plan / Recommendation Clinical Impression  SLP returned to educate the pt and pt's daughter on the risk of aspiration and recommended strategies for comfort care measures. Pt unable to meaningfully participate due to lethargy and overall health status, the pt's daughter was engaged in conversation primarily. SLP briefly reviewed the risks of aspiration (choking, repeated hospitalizations, and death), pt's daughter verbally acknowledges understanding and accepts the risks to opt for comfort measures. The SLP reviewed strategies to increase functional PO intake per pt's abilities (no straws, consistent verbal cueing, spoon sips, no PO intake if there's excessive anterior loss or no attempt to swallow). The pt's daughter verbally acknowledges understanding and is agreeable to these approaches. She disclosed wanting to give mountain dew via swabs to address QOL concerns. Pt's daughter had no further questions at this time. Current diet per comfort measures and approaches left above HOB for nursing staff, education completed at this time. No speech f/u required at this time.   HPI HPI: 89 y.o. male presents to [facility] hospital on 02/22/2023 with L weakness and dysarthria. CT head negative, further imaging pending. PMH includes OA, afib, CKD, HTN, melanoma, PPM. Pt did not pass yale but has been recieving meds NPO. Pt still presenting with severe dysarthria.      SLP Plan         Recommendations for follow up therapy are one component of a multi-disciplinary discharge planning process, led by the attending physician.  Recommendations may be updated based on patient status, additional functional criteria and insurance authorization.    Recommendations  Diet recommendations: Dysphagia 1  (puree);Thin liquid Medication Administration: Crushed with puree Compensations: Slow rate;Small sips/bites Postural Changes and/or Swallow Maneuvers: Seated upright 90 degrees;Upright 30-60 min after meal                  Oral care QID;Oral care prior to ice chip/H20     Dysphagia, unspecified (R13.10)           Dione Housekeeper M.S. CCC-SLP

## 2023-02-24 NOTE — Progress Notes (Signed)
[facility] hospital liaison note   Referral received for patient/family interest in hospice home [place]   Evaluation will take place tomorrow.    Please call with any questions or concerns. Thank you   Dionicio Stall, LCSW [facility] hospital liaison  [phone]

## 2023-02-24 NOTE — Progress Notes (Signed)
Pt keeps bleeding from IV site, which is a new IV. Family is concerned about site still bleeding. IV team worked with it and was unable to get the bleeding to stop from IV site as well. RN messaged MD on call to see if fluids can be stopped and IV removed due to bleeding.   Howard Harmon

## 2023-02-24 NOTE — Evaluation (Signed)
Speech Language Pathology Evaluation Patient Details Name: Howard Harmon MRN: [id number] DOB: [birth year] Today's Date: 02/24/2023 Time: 7829-5621 SLP Time Calculation (min) (ACUTE ONLY): 27 min  Problem List:  Patient Active Problem List   Diagnosis Date Noted   Acute left-sided weakness 02/23/2023   Acute renal failure superimposed on stage 3b chronic kidney disease (HCC) 02/23/2023   Sepsis (HCC) 10/24/2018   Anemia    Grade II hemorrhoids    Acute posthemorrhagic anemia    Hypoxia 10/23/2017   Hematochezia    Benign neoplasm of ascending colon    Atrial fibrillation (HCC) 08/31/2016   Sinoatrial node dysfunction (HCC) 08/31/2016   History of DVT of lower extremity 06/14/2016   Aortic atherosclerosis (HCC) 11/10/2015   Chronic respiratory failure with hypoxia (HCC) 11/09/2015   Lumbar stenosis with neurogenic claudication 04/01/2015   DDD (degenerative disc disease), lumbosacral 01/26/2015   Rectal bleed 01/01/2015   GIB (gastrointestinal bleeding) 10/12/2014   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   CKD (chronic kidney disease), stage III (HCC) 10/12/2014   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   HTN (hypertension) 11/07/2013   OA (osteoarthritis) of knee 11/07/2013   Peripheral sensory neuropathy 11/07/2013   Malignant melanoma of skin of chest (HCC) 08/06/2013   Past Medical History:  Past Medical History:  Diagnosis Date   Arthritis    Atrial fibrillation (HCC)    Blood clotting tendency (HCC)    Chronic kidney disease    DVT (deep venous thrombosis) (HCC)    Hypertension    Melanoma (HCC)    Pacemaker    Past Surgical History:  Past Surgical History:  Procedure Laterality Date   CHOLECYSTECTOMY     COLONOSCOPY WITH PROPOFOL N/A 10/15/2014   Procedure: COLONOSCOPY WITH PROPOFOL;  Surgeon: Elnita Maxwell, MD;  Location: [facility] ENDOSCOPY;  Service: Endoscopy;  Laterality: N/A;   COLONOSCOPY WITH PROPOFOL N/A 10/23/2017    Procedure: COLONOSCOPY WITH PROPOFOL;  Surgeon: Midge Minium, MD;  Location: [facility] ENDOSCOPY;  Service: Endoscopy;  Laterality: N/A;   ESOPHAGOGASTRODUODENOSCOPY N/A 10/24/2017   Procedure: ESOPHAGOGASTRODUODENOSCOPY (EGD);  Surgeon: Pasty Spillers, MD;  Location: [facility] ENDOSCOPY;  Service: Endoscopy;  Laterality: N/A;   INSERT / REPLACE / REMOVE PACEMAKER     HPI:  89 y.o. male [facility] to [facility] hospital on 02/22/[facility]23 with L weakness and dysarthria. CT head negative, further imaging pending. PMH includes OA, afib, CKD, HTN, melanoma, PPM. Pt did not pass yale but has been recieving meds NPO. Pt still presenting with severe dysarthria.   Assessment / Plan / Recommendation Clinical Impression  Pt seen for skilled ST services to determine PO readiness. The pt is currently NPO and was assessed with ice chips, thin liquids, and nectar thick liquid. Upon arrival, the pt was lethargic and left side leaning upright in bed. Pt had reduced intelligibility and required intermittent cueing to remain alert during conversation. Pt given an OME, pt had left side facial, labial, and lingual weakness- with poor oral awareness and general reduced lingual ROM. The pt elicited a weak cough and unable to elicit a throat clearance. Pt given oral care via swabbing from SLP, dry secretions on the tongue and concern for poor oral health. The pt was given ice chips via spoon x3 with total assist, with moderate anterior loss, poor awareness, bolus holding, and delayed and weak swallow trigger. The pt was given thin liquid via spoon, cup, and straw. The pt had poor labial seal for straw sips and moderate-severe anterior loss for  cup and spoon sips. The pt was prompted to take a large sip had an immediate cough. The pt was given nectar thick liquid via spoon and had moderate anterior loss and made no initiation to swallow given moderate verbal cueing and light tactile cueing. PO trials stopped at this time MD and pt's daughter in the  room for majority of the eval. Safest diet rec given health status and success with PO trials is NPO, however pt's daughter agreeable to comfort measures, in which case puree (IDDSI 4)/thin liquid (IDDSI 0) would be recommended to increase liklihood of functional PO intake per pt's current abilities. MD recommending hospice, daughter agreeable to hospice at this time. SLP attempted to fully educate daughter on the risks of aspiration and the protocol for comfort measure, the pt's daughter needed to make a call with her brother so unable to engage in education at this time. The pt's daughter would benefit from f/u education to address aspiration risks and comfort measure protocols.    SLP Assessment  SLP Visit Diagnosis: Dysphagia, unspecified (R13.10)    Recommendations for follow up therapy are one component of a multi-disciplinary discharge planning process, led by the attending physician.  Recommendations may be updated based on patient status, additional functional criteria and insurance authorization.    Follow Up Recommendations  Follow physician's recommendations for discharge plan and follow up therapies    Assistance Recommended at Discharge     Functional Status Assessment    Frequency and Duration min 1 x/week (Will see one more time to complete family education)         SLP Evaluation Cognition  Orientation Level: Oriented to person;Disoriented to time;Disoriented to situation;Disoriented to place       Comprehension       Expression     Oral / Motor  Oral Motor/Sensory Function Overall Oral Motor/Sensory Function: Moderate impairment Facial ROM: Reduced left Facial Symmetry: Abnormal symmetry left Facial Strength: Reduced left Facial Sensation: Reduced left Lingual ROM: Reduced left Lingual Strength: Reduced            Dione Housekeeper M.S. CCC-SLP

## 2023-02-24 NOTE — NC FL2 (Signed)
[place] MEDICAID FL2 LEVEL OF CARE FORM     IDENTIFICATION  Patient Name: Howard Harmon Birthdate: 06-Sep-1929 Sex: male Admission Date (Current Location): 02/22/2023  [facility] and [place] Number:  Producer, television/film/video and Address:  [facility] [place], [address] [place], [place] [address]      Provider Number: [id number]  Attending Physician Name and Address:  Maretta Bees, MD  Relative Name and Phone Number:       Current Level of Care: SNF Recommended Level of Care: Skilled Nursing Facility Prior Approval Number:    Date Approved/Denied:   PASRR Number: [id number]  Discharge Plan: SNF    Current Diagnoses: Patient Active Problem List   Diagnosis Date Noted   Acute left-sided weakness 02/23/2023   Acute renal failure superimposed on stage 3b chronic kidney disease (HCC) 02/23/2023   Sepsis (HCC) 10/24/2018   Anemia    Grade II hemorrhoids    Acute posthemorrhagic anemia    Hypoxia 10/23/2017   Hematochezia    Benign neoplasm of ascending colon    Atrial fibrillation (HCC) 08/31/2016   Sinoatrial node dysfunction (HCC) 08/31/2016   History of DVT of lower extremity 06/14/2016   Aortic atherosclerosis (HCC) 11/10/2015   Chronic respiratory failure with hypoxia (HCC) 11/09/2015   Lumbar stenosis with neurogenic claudication 04/01/2015   DDD (degenerative disc disease), lumbosacral 01/26/2015   Rectal bleed 01/01/2015   GIB (gastrointestinal bleeding) 10/12/2014   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   CKD (chronic kidney disease), stage III (HCC) 10/12/2014   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   HTN (hypertension) 11/07/2013   OA (osteoarthritis) of knee 11/07/2013   Peripheral sensory neuropathy 11/07/2013   Malignant melanoma of skin of chest (HCC) 08/06/2013    Orientation RESPIRATION BLADDER Height & Weight     Self  Normal Incontinent, External catheter Weight: 167 lb 15.9 oz (76.2  kg) Height:     BEHAVIORAL SYMPTOMS/MOOD NEUROLOGICAL BOWEL NUTRITION STATUS      Continent Diet  AMBULATORY STATUS COMMUNICATION OF NEEDS Skin   Extensive Assist Verbally Normal                       Personal Care Assistance Level of Assistance  Bathing, Feeding, Dressing Bathing Assistance: Maximum assistance Feeding assistance: Independent Dressing Assistance: Maximum assistance     Functional Limitations Info  Sight, Hearing, Speech Sight Info:  (Left eye-blind) Hearing Info: Adequate Speech Info: Adequate    SPECIAL CARE FACTORS FREQUENCY  PT (By licensed PT), OT (By licensed OT)     PT Frequency: 5xweek OT Frequency: 5xweek            Contractures Contractures Info: Not present    Additional Factors Info  Code Status, Allergies Code Status Info: Full Allergies Info: Warfarin           Current Medications (02/24/2023):  This is the current hospital active medication list Current Facility-Administered Medications  Medication Dose Route Frequency Provider Last Rate Last Admin    stroke: early stages of recovery book   Does not apply Once Opyd, Lavone Neri, MD       acetaminophen (TYLENOL) tablet 650 mg  650 mg Oral Q4H PRN Opyd, Lavone Neri, MD       Or   acetaminophen (TYLENOL) 160 MG/5ML solution 650 mg  650 mg Per Tube Q4H PRN Opyd, Lavone Neri, MD       Or   acetaminophen (TYLENOL) suppository 650  mg  650 mg Rectal Q4H PRN Opyd, Lavone Neri, MD       aspirin suppository 300 mg  300 mg Rectal Daily Pamalee Leyden, [place], NP       dextrose 5 % and 0.45 % NaCl infusion   Intravenous Continuous Kathrynn Running, MD 75 mL/hr at 02/23/23 2352 New Bag at 02/23/23 2352   heparin injection 5,000 Units  5,000 Units Subcutaneous Q8H Opyd, Lavone Neri, MD   5,000 Units at 02/23/23 2130   senna-docusate (Senokot-S) tablet 1 tablet  1 tablet Oral QHS PRN Opyd, Lavone Neri, MD         Discharge Medications: Please see discharge summary for a list of discharge  medications.  Relevant Imaging Results:  Relevant Lab Results:   Additional Information SSN: [id number]  Oletta Lamas, MSW, [facility] Transitions of Care  Clinical Social Worker I

## 2023-02-24 NOTE — Evaluation (Signed)
Occupational Therapy Evaluation Patient Details Name: Howard Harmon MRN: [id number] DOB: [birth year] Today's Date: 02/24/2023   History of Present Illness 89 y.o. male presents to [facility] hospital on 02/22/2023 with L weakness and dysarthria. CT head negative, further imaging pending. PMH includes OA, afib, CKD, HTN, melanoma, PPM.   Clinical Impression   Pt admitted for above, strong L inattention and needs mod to total A for ADL assistance. Pt did attend to L side with several verbal cues and manual facilitation of his head to the L side, got better with repetition. LUE/LLE producing no AROM, cognition is impaired but with increased time and cueing he can follow commands pertaining to his R side. Pt would benefit from continued acute skilled OT services to address deficits and help transition to next level of care. Patient would benefit from post acute skilled rehab facility with <3 hours of therapy and 24/7 support       If plan is discharge home, recommend the following: Two people to help with walking and/or transfers;A lot of help with bathing/dressing/bathroom;Assistance with cooking/housework;Supervision due to cognitive status;Direct supervision/assist for financial management;Assistance with feeding    Functional Status Assessment  Patient has had a recent decline in their functional status and demonstrates the ability to make significant improvements in function in a reasonable and predictable amount of time.  Equipment Recommendations  None recommended by OT (TBD at next level of care)    Recommendations for Other Services       Precautions / Restrictions Precautions Precautions: Fall Precaution Comments: L neglect Restrictions Weight Bearing Restrictions: No      Mobility Bed Mobility Overal bed mobility: Needs Assistance Bed Mobility: Rolling, Sit to Supine, Sidelying to Sit Rolling: Max assist Sidelying to sit: HOB elevated, Max assist   Sit to supine: Total assist,  HOB elevated        Transfers Overall transfer level: Needs assistance Equipment used: 1 person hand held assist Transfers: Sit to/from Stand Sit to Stand: Total assist           General transfer comment: moved closer to [address]      Balance Overall balance assessment: Needs assistance Sitting-balance support: Single extremity supported, Feet supported Sitting balance-Leahy Scale: Poor Sitting balance - Comments: Max A to maintain static sitting, post lean. Elbow propping L and R Postural control: Posterior lean Standing balance support: Bilateral upper extremity supported, Reliant on assistive device for balance Standing balance-Leahy Scale: Zero Standing balance comment: totalA                           ADL either performed or assessed with clinical judgement   ADL Overall ADL's : Needs assistance/impaired Eating/Feeding: Total assistance;Bed level Eating/Feeding Details (indicate cue type and reason): swallowing impairments Grooming: Bed level;Moderate assistance;Wash/dry face Grooming Details (indicate cue type and reason): Pt washing R side of face, decreased initation. Needs hand over hand assist to wash L side of face Upper Body Bathing: Sitting;Maximal assistance   Lower Body Bathing: Sitting/lateral leans;Maximal assistance   Upper Body Dressing : Sitting;Maximal assistance   Lower Body Dressing: Sitting/lateral leans;Total assistance   Toilet Transfer: Total assistance;Stand-pivot;+2 for physical assistance;+2 for safety/equipment Toilet Transfer Details (indicate cue type and reason): Transferred higher to [facility] with total A, anticipate need for Total +2 with all transfers Toileting- Clothing Manipulation and Hygiene: Total assistance               Vision   Additional Comments: L neglect,  not tracking or turning head past midline     Perception         Praxis         Pertinent Vitals/Pain Pain Assessment Pain Assessment: No/denies  pain     Extremity/Trunk Assessment Upper Extremity Assessment LUE Deficits / Details: LUE is flacid at this time, PROM WFL LUE Sensation: decreased light touch;decreased proprioception   Lower Extremity Assessment LLE Deficits / Details: no AROM noted from LLE, PROM WFL LLE Sensation: decreased light touch   Cervical / Trunk Assessment Cervical / Trunk Assessment: Kyphotic (head flexed)   Communication Communication Communication: Other (comment) (dysarthria) Cueing Techniques: Verbal cues;Tactile cues   Cognition Arousal: Alert Behavior During Therapy: Impulsive   Area of Impairment: Following commands, Safety/judgement, Awareness, Problem solving, Attention                   Current Attention Level: Focused   Following Commands: Follows one step commands with increased time Safety/Judgement: Decreased awareness of safety, Decreased awareness of deficits Awareness: Intellectual Problem Solving: Slow processing, Difficulty sequencing, Requires verbal cues, Decreased initiation General Comments: left neglect     General Comments  VSS, daughther requested to step out of the room for session    Exercises     Shoulder Instructions      Home Living Family/patient expects to be discharged to:: Unsure Living Arrangements: Alone (caretakers taker from 10pm-8 am) Available Help at Discharge: Family;Available PRN/intermittently;Other (Comment) (hospice services overnight) Type of Home: House Home Access: Stairs to enter Entrance Stairs-Number of Steps: 3   Home Layout: One level               Home Equipment: Rollator (4 wheels);Wheelchair - manual;BSC/3in1          Prior Functioning/Environment Prior Level of Function : Needs assist             Mobility Comments: transferring from bed to wheelchair/recliner, no ambulation in the last month ADLs Comments: assist for all ADL/IADLs        OT Problem List: Impaired balance (sitting and/or  standing);Impaired UE functional use;Impaired tone;Impaired vision/perception;Impaired sensation      OT Treatment/Interventions: Self-care/ADL training;Balance training;Therapeutic exercise;Neuromuscular education;Therapeutic activities;Patient/family education;Visual/perceptual remediation/compensation    OT Goals(Current goals can be found in the care plan section) Acute Rehab OT Goals Patient Stated Goal: None stated OT Goal Formulation: Patient unable to participate in goal setting Time For Goal Achievement: 03/10/23 Potential to Achieve Goals: Fair  OT Frequency: Min 1X/week    Co-evaluation              AM-PAC OT "6 Clicks" Daily Activity     Outcome Measure Help from another person eating meals?: Total Help from another person taking care of personal grooming?: A Lot Help from another person toileting, which includes using toliet, bedpan, or urinal?: Total Help from another person bathing (including washing, rinsing, drying)?: A Lot Help from another person to put on and taking off regular upper body clothing?: A Lot Help from another person to put on and taking off regular lower body clothing?: Total 6 Click Score: 9   End of Session Nurse Communication: Mobility status (Total A +2)  Activity Tolerance: Patient tolerated treatment well Patient left: in bed;with call bell/phone within reach;with bed alarm set  OT Visit Diagnosis: Other symptoms and signs involving the nervous system (R29.898);Other symptoms and signs involving cognitive function;Hemiplegia and hemiparesis Hemiplegia - Right/Left: Left Hemiplegia - dominant/non-dominant: Non-Dominant Hemiplegia - caused by: Cerebral infarction  Time: 4696-2952 OT Time Calculation (min): 26 min Charges:  OT General Charges $OT Visit: 1 Visit OT Evaluation $OT Eval Moderate Complexity: 1 Mod OT Treatments $Therapeutic Activity: 8-22 mins  02/24/2023  AB, OTR/L  Acute Rehabilitation  Services  Office: [phone]   Tristan Schroeder 02/24/2023, 10:18 AM

## 2023-02-24 NOTE — Progress Notes (Signed)
Daughter Birchard) at bedside-spoke with son Onalee Hua over the phone (he is the POA).  Family has had a chance to discuss things overnight.  Patient continues to have severe oropharyngeal dysphagia-SLP in the room-n.p.o. status recommended.  Continues to have significant left-sided hemiplegia.  Long discussion with both Onalee Hua over the phone and Kraushaar at bedside-family has decided to pursue comfort measures-and they prefer residential hospice.  Given severity of CVA-severe dysphagia-life expectancy is expected to be less than 2 weeks.  No further workup planned Family okay with comfort feeds-aspiration risks accepted  Will consult social work for residential hospice placement.

## 2023-02-24 NOTE — Consult Note (Signed)
Consultation Note Date: 02/24/2023   Patient Name: Howard Harmon  DOB: [birth year]  MRN: [id number]  Age / Sex: 89 y.o., male  PCP: Howard Penton, MD Referring Physician: Maretta Bees, MD  Reason for Consultation: Establishing goals of care  HPI/Patient Profile: 89 y.o. male  with past medical history of sinoatrial node dysfunction with pacemaker, hypertension, CKD 3B, history of DVT, atrial fibrillation, and traumatic SDH in January 2024 was admitted on 02/22/2023 with acute right MCA and ACA stroke, AKI on CKD 3B, hyperkalemia, metabolic acidosis.  Stroke was likely due to A-fib not on [facility].  Patient not a candidate for [facility] due to history of GI bleed and recent traumatic SDH.  After failed swallow  evaluation and discussions with attending/Dr. Jerral Harmon on 02/24/23 family opted for patient to transition to full comfort care.  Clinical Assessment and Goals of Care: I have reviewed medical records including EPIC notes, labs, and imaging. Discussed case with Dr. Jerral Harmon.   Went to visit patient at bedside -daughter/Howard Harmon present. Patient was lying in bed - his eyes are mostly closed for the duration of my visit. He responds to yes/no questions but otherwise does not verbally engage. He is very restless, weak, and frail appearing. No respiratory distress, increased work of breathing, or secretions noted. Howard Harmon is giving him [facility] via a mouth sponge, which he seems to enjoy.  Met with Howard Harmon  to discuss diagnosis, prognosis, GOC, EOL wishes, disposition, and options.  I introduced Palliative Medicine as specialized medical care for people living with serious illness. It focuses on providing relief from the symptoms and stress of a serious illness. The goal is to improve quality of life for both the patient and the family.  Howard Harmon confirms she and her brother/patient's POA spoke with Dr. Jerral Harmon this morning and  the decision was made for patient's transition to full comfort measures today.   Answered questions regarding next steps: hospice transition, symptom management, how to discuss hospice information with the patient. Also reviewed natural trajectory at EOL.  Family request patient's transfer to [facility] [place] [facility].   Visit also consisted of discussions dealing with the complex and emotionally intense issues of symptom management and palliative care in the setting of end of life care.  Discussed with Howard Harmon the importance of continued conversation with family and the medical providers regarding overall plan of care and treatment options, ensuring decisions are within the context of the patient's values and GOCs.    Questions and concerns were addressed. The patient/family was encouraged to call with questions and/or concerns. PMT card was provided.   Primary Decision Maker: HCPOA - son/Howard Harmon    SUMMARY OF RECOMMENDATIONS   Continue full comfort measures Continue DNR/DNI as previously documented - durable DNR form completed and placed in shadow chart. Copy was made and will be scanned into Vynca/ACP tab Family requesting [facility] [place] with [facility] - TOC consult placed; TOC and hospice liaison notified Added orders for EOL symptom management and to reflect full comfort measures, as well as discontinued orders that were not focused on comfort Unrestricted visitation orders were placed per current [place] EOL visitation policy  Nursing to provide frequent assessments and administer PRN medications as clinically necessary to ensure EOL comfort PMT will continue to follow and support holistically   Symptom Management Dilaudid PRN pain/dyspnea/increased work of breathing/RR>25 Roxicodone PRN moderate pain/distress Tylenol PRN pain/fever Biotin twice daily Benadryl PRN itching Robinul PRN secretions Haldol PRN  agitation/delirium Scheduled Ativan q6h; PRN doses for anxiety/seizure/sleep/distress Zofran PRN nausea/vomiting Liquifilm Tears PRN dry eye   Code Status/Advance Care Planning: DNR  Palliative Prophylaxis:  Aspiration, Bowel Regimen, Delirium Protocol, Frequent Pain Assessment, Oral Care, and Turn Reposition  Additional Recommendations (Limitations, Scope, Preferences): Full Comfort Care  Psycho-social/Spiritual:  Desire for further Chaplaincy support:no Created space and opportunity for patient and family to express thoughts and feelings regarding patient's current medical situation.  Emotional support and therapeutic listening provided.  Prognosis:  < 2 weeks  Discharge Planning: Hospice facility      Primary Diagnoses: Present on Admission:  HTN (hypertension)  Chronic respiratory failure with hypoxia (HCC)  Atrial fibrillation (HCC)  Acute renal failure superimposed on stage 3b chronic kidney disease (HCC)  CKD (chronic kidney disease), stage III (HCC)   I have reviewed the medical record, interviewed the patient and family, and examined the patient. The following aspects are pertinent.  Past Medical History:  Diagnosis Date   Arthritis    Atrial fibrillation (HCC)    Blood clotting tendency (HCC)    Chronic kidney disease    DVT (deep venous thrombosis) (HCC)    Hypertension    Melanoma (HCC)    Pacemaker    Social History   Socioeconomic History   Marital status: Widowed    Spouse name: Not on file   Number of children: Not on file   Years of education: Not on file   Highest education level: Not on file  Occupational History   Not on file  Tobacco Use   Smoking status: Never   Smokeless tobacco: Never  Vaping Use   Vaping status: Never Used  Substance and Sexual Activity   Alcohol use: No   Drug use: Never   Sexual activity: Not on file  Other Topics Concern   Not on file  Social History Narrative   Not on file   Social Determinants of  Health   Financial Resource Strain: Not on file  Food Insecurity: No Food Insecurity (02/23/2023)   Hunger Vital Sign    Worried About Running Out of Food in the Last Year: Never true    Ran Out of Food in the Last Year: Never true  Transportation Needs: No Transportation Needs (02/23/2023)   PRAPARE - Administrator, Civil Service (Medical): No    Lack of Transportation (Non-Medical): No  Physical Activity: Not on file  Stress: Not on file  Social Connections: Not on file   Family History  Problem Relation Age of Onset   CAD Father    Prostate cancer Brother    Breast cancer Sister    Scheduled Meds:   stroke: early stages of recovery book   Does not apply Once   aspirin  300 mg Rectal Daily   heparin  5,000 Units Subcutaneous Q8H   Continuous Infusions:  dextrose 5 % and 0.45 % NaCl 75 mL/hr at 02/23/23 2352   PRN Meds:.acetaminophen **OR** acetaminophen (TYLENOL) oral liquid 160 mg/5 mL **OR** acetaminophen, senna-docusate Medications Prior to Admission:  Prior to Admission medications   Medication Sig Start Date End Date Taking? Authorizing Provider  Cholecalciferol (VITAMIN D3 PO) Take 1 tablet by mouth daily.   Yes [provider]  COENZYME Q10 PO Take 1 tablet by mouth daily.   Yes [provider]  diclofenac Sodium (VOLTAREN) 1 % GEL Apply 2 g topically 4 (four) times daily. 11/27/20  Yes Joni Reining, PA-C  docusate sodium (COLACE) 100 MG capsule  Take 100-200 mg by mouth 2 (two) times daily.    Yes [provider]  furosemide (LASIX) 20 MG tablet Take 20 mg by mouth daily as needed for fluid.    Yes [provider]  omega-3 acid ethyl esters (LOVAZA) 1 g capsule Take 1-2 g by mouth 2 (two) times daily.   Yes [provider]  oxymorphone (OPANA ER) 10 MG 12 hr tablet Take 10 mg by mouth See admin instructions. Take 1 tablet with lunch and take 1 tablet with dessert. Take 1 tablet as needed extra.   Yes [provider]  RABEprazole (ACIPHEX) 20 MG tablet Take 20 mg by mouth in the morning and at bedtime.   Yes [provider]  SENNA LEAVES PO Take 1 capsule by mouth 2 (two) times a day.    Yes [provider]  tetrahydrozoline-zinc (VISINE-AC) 0.05-0.25 % ophthalmic solution Place 1 drop into both eyes 3 (three) times daily as needed.   Yes [provider]  traMADol (ULTRAM) 50 MG tablet Take 1 tablet (50 mg total) by mouth every 6 (six) hours as needed for moderate pain. Patient taking differently: Take 100 mg by mouth 2 (two) times daily. 10/26/18  Yes Shaune Pollack, MD  vitamin B-12 (CYANOCOBALAMIN) 1000 MCG tablet Take 1,000 mcg by mouth daily.   Yes [provider]  aspirin 81 MG chewable tablet Chew 81 mg by mouth daily. Patient not taking: Reported on 02/23/2023    [provider]   Allergies  Allergen Reactions   Warfarin Other (See Comments)    Bleeding diff to stop   Review of Systems  Unable to perform ROS: Acuity of condition    Physical Exam Vitals and nursing note reviewed.  Constitutional:      General: He is not in acute distress.    Appearance: He is ill-appearing.  Pulmonary:     Effort: No respiratory distress.  Skin:    General: Skin is warm and dry.  Neurological:     Motor: Weakness present.  Psychiatric:        Judgment: Judgment is impulsive.     Vital Signs: BP (!) 158/100   Pulse 60   Temp 97.8 F (36.6 C) (Axillary)   Resp 19   Wt 76.2 kg   SpO2 98%   BMI 23.43 kg/m  Pain Scale: 0-10   Pain Score: 0-No pain   SpO2: SpO2: 98 % O2 Device:SpO2: 98 % O2 Flow Rate: .   IO: Intake/output summary:  Intake/Output Summary (Last 24 hours) at 02/24/2023 0942 Last data filed at 02/24/2023 0830 Gross per 24 hour  Intake 219.11 ml  Output 2025 ml  Net -1805.89 ml    LBM:   Baseline Weight: Weight: 76.2 kg Most recent weight: Weight: 76.2 kg     Palliative Assessment/Data: PPS 10-20%     Signed  by: Haskel Khan, NP   Please contact Palliative Medicine Team phone at [phone] for questions and concerns.  For individual provider: See Amion  *Portions of this note are a verbal dictation therefore any spelling and/or grammatical errors are due to the "Dragon Medical One" system interpretation.

## 2023-02-24 NOTE — TOC Initial Note (Signed)
Transition of Care [facility]) - Initial/Assessment Note    Patient Details  Name: Howard Harmon MRN: [id number] Date of Birth: 1929-10-14  Transition of Care [facility]) CM/SW Contact:    Leander Rams, LCSW Phone Number: 02/24/2023, 9:55 AM  Clinical Narrative:                 Pt not fully oriented. CSW completed TOC assessment with pt daughter Howard Harmon. Pt daughter reports pt lives alone but had an overnight aide come to the home. Howard Harmon is agreeable for pt to dc to SNF upon dc and states family prefers to be in the [place] area.   CSW will complete fl2 and fax out. TOC will continue to follow.   Expected Discharge Plan: Skilled Nursing Facility Barriers to Discharge: Continued Medical Work up   Patient Goals and CMS Choice            Expected Discharge Plan and Services       Living arrangements for the past 2 months: Single Family Home                                      Prior Living Arrangements/Services Living arrangements for the past 2 months: Single Family Home Lives with:: Self Patient language and need for interpreter reviewed::  (Assessent  completed with daughter Howard Harmon)        Need for Family Participation in Patient Care: Yes (Comment) Care giver support system in place?: Yes (comment) Current home services: Homehealth aide, DME Criminal Activity/Legal Involvement Pertinent to Current Situation/Hospitalization: No - Comment as needed  Activities of Daily Living Home Assistive Devices/Equipment: Environmental consultant (specify type), Wheelchair, Raised toilet seat with rails, Bedside commode/3-in-1, Cane (specify quad or straight), Brace (specify type), Eyeglasses, Grab bars in shower, Hand-held shower hose, Long-handled shoehorn, Oxygen, Reacher, Shower chair without back, Transfer belt (rollater, striaght and quad cane,knee brace) ADL Screening (condition at time of admission) Patient's cognitive ability adequate to safely complete daily activities?: No Is the patient  deaf or have difficulty hearing?: Yes Does the patient have difficulty seeing, even when wearing glasses/contacts?: No Does the patient have difficulty concentrating, remembering, or making decisions?: Yes (dementia) Patient able to express need for assistance with ADLs?: No Does the patient have difficulty dressing or bathing?: Yes Independently performs ADLs?: No Communication: Independent Dressing (OT): Needs assistance Is this a change from baseline?: Pre-admission baseline Grooming: Dependent Is this a change from baseline?: Pre-admission baseline Feeding: Needs assistance Is this a change from baseline?: Pre-admission baseline Bathing: Dependent Is this a change from baseline?: Pre-admission baseline Toileting: Dependent Is this a change from baseline?: Pre-admission baseline In/Out Bed: Needs assistance Is this a change from baseline?: Pre-admission baseline Walks in Home: Dependent Is this a change from baseline?: Pre-admission baseline Does the patient have difficulty walking or climbing stairs?: Yes Weakness of Legs: Both Weakness of Arms/Hands: Both  Permission Sought/Granted                  Emotional Assessment   Attitude/Demeanor/Rapport: Unable to Assess Affect (typically observed): Unable to Assess Orientation: : Oriented to Self Alcohol / Substance Use: Not Applicable Psych Involvement: No (comment)  Admission diagnosis:  Acute ischemic stroke [facility]) [I63.9] Acute left-sided weakness [R53.1] Patient Active Problem List   Diagnosis Date Noted   Acute left-sided weakness 02/23/2023   Acute renal failure superimposed on stage 3b chronic kidney disease (HCC) 02/23/2023  Sepsis (HCC) 10/24/2018   Anemia    Grade II hemorrhoids    Acute posthemorrhagic anemia    Hypoxia 10/23/2017   Hematochezia    Benign neoplasm of ascending colon    Atrial fibrillation (HCC) 08/31/2016   Sinoatrial node dysfunction (HCC) 08/31/2016   History of DVT of lower  extremity 06/14/2016   Aortic atherosclerosis (HCC) 11/10/2015   Chronic respiratory failure with hypoxia (HCC) 11/09/2015   Lumbar stenosis with neurogenic claudication 04/01/2015   DDD (degenerative disc disease), lumbosacral 01/26/2015   Rectal bleed 01/01/2015   GIB (gastrointestinal bleeding) 10/12/2014   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   CKD (chronic kidney disease), stage III (HCC) 10/12/2014   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 [place] [address] Phone: [phone] Fax: [phone]     Social Determinants of Health (SDOH) Social History: SDOH Screenings   Food Insecurity: No Food Insecurity (02/23/2023)  Housing: Low Risk  (02/23/2023)  Transportation Needs: No Transportation Needs (02/23/2023)  Utilities: Not At Risk (02/23/2023)  Tobacco Use: Low Risk  (02/23/2023)   SDOH Interventions:     Readmission Risk Interventions     No data to display         Howard Harmon, MSW, LCSWA, LCASA Transitions of Care  Clinical Social Worker I

## 2023-02-25 DIAGNOSIS — Z515 Encounter for palliative care: Secondary | ICD-10-CM | POA: Diagnosis not present

## 2023-02-25 DIAGNOSIS — Z7189 Other specified counseling: Secondary | ICD-10-CM | POA: Diagnosis not present

## 2023-02-25 DIAGNOSIS — I639 Cerebral infarction, unspecified: Secondary | ICD-10-CM | POA: Diagnosis not present

## 2023-02-25 DIAGNOSIS — R531 Weakness: Secondary | ICD-10-CM | POA: Diagnosis not present

## 2023-02-25 NOTE — Discharge Instructions (Signed)
Disposition.  Residential hospice °Condition.  Guarded °CODE STATUS.  DNR °Activity.  With assistance as tolerated, full fall precautions. °Diet.  Soft with feeding assistance and aspiration precautions. °Goal of care.  Comfort. ° °

## 2023-02-25 NOTE — Progress Notes (Signed)
Daily Progress Note   Patient Name: Howard Harmon       Date: 02/25/2023 DOB: 1930-01-10  Age: 89 y.o. MRN#: [id number] Attending Physician: Leroy Sea, MD Primary Care Physician: Danella Penton, MD Admit Date: 02/22/2023  Reason for Consultation/Follow-up: Non pain symptom management, Pain control, Psychosocial/spiritual support, and Terminal Care  Subjective: I have reviewed medical records including EPIC notes, MAR, and labs. Received report from primary RN - no acute concerns. RN reports patient's oral intake is minimal sips with mouth sponge.  Went to visit patient at bedside - daughter/Luann present. Patient was lying in bed asleep - did not attempt to wake him to preserve comfort. No signs or non-verbal gestures of pain or discomfort noted. No respiratory distress, increased work of breathing, or secretions noted. Small periods of apnea noted.  Emotional support provided to Luann. Provided updates hospice evaluation should occur today. She expresses appreciation for PMT assistance in answering questions and navigating hospice transfer. Therapeutic listening provided as she expresses relief patient is more comfortable after initiating scheduled ativan. Several family members were able to visit with patient last night as well.  All questions and concerns addressed. Encouraged to call with questions and/or concerns. PMT card previously provided.  Length of Stay: 2  Current Medications: Scheduled Meds:   antiseptic oral rinse  15 mL Mouth Rinse BID   feeding supplement  237 mL Oral BID BM   LORazepam  0.5 mg Intravenous Q6H    Continuous Infusions:   PRN Meds: acetaminophen **OR** acetaminophen, diphenhydrAMINE, glycopyrrolate **OR** glycopyrrolate **OR** glycopyrrolate,  haloperidol **OR** haloperidol **OR** haloperidol lactate, HYDROmorphone (DILAUDID) injection, LORazepam **OR** LORazepam **OR** LORazepam, ondansetron **OR** ondansetron (ZOFRAN) IV, oxyCODONE **OR** oxyCODONE, polyvinyl alcohol  Physical Exam Vitals and nursing note reviewed.  Constitutional:      General: He is not in acute distress.    Appearance: He is ill-appearing.  Pulmonary:     Effort: No respiratory distress.     Comments: Apnea  Skin:    General: Skin is warm and dry.  Neurological:     Motor: Weakness present.     Comments: asleep             Vital Signs: BP (!) 147/107 (BP Location: Right Arm)   Pulse 71   Temp 98.6 F (37 C) (Axillary)  Resp 14   Wt 76.2 kg   SpO2 98%   BMI 23.43 kg/m  SpO2: SpO2: 98 % O2 Device: O2 Device: Room Air O2 Flow Rate:    Intake/output summary:  Intake/Output Summary (Last 24 hours) at 02/25/2023 7253 Last data filed at 02/25/2023 0100 Gross per 24 hour  Intake 100 ml  Output 2600 ml  Net -2500 ml   LBM:   Baseline Weight: Weight: 76.2 kg Most recent weight: Weight: 76.2 kg       Palliative Assessment/Data: PPS 10-20%      Patient Active Problem List   Diagnosis Date Noted   Acute left-sided weakness 02/23/2023   Acute renal failure superimposed on stage 3b chronic kidney disease (HCC) 02/23/2023   Sepsis (HCC) 10/24/2018   Anemia    Grade II hemorrhoids    Acute posthemorrhagic anemia    Hypoxia 10/23/2017   Hematochezia    Benign neoplasm of ascending colon    Atrial fibrillation (HCC) 08/31/2016   Sinoatrial node dysfunction (HCC) 08/31/2016   History of DVT of lower extremity 06/14/2016   Aortic atherosclerosis (HCC) 11/10/2015   Chronic respiratory failure with hypoxia (HCC) 11/09/2015   Lumbar stenosis with neurogenic claudication 04/01/2015   DDD (degenerative disc disease), lumbosacral 01/26/2015   Rectal bleed 01/01/2015   GIB (gastrointestinal bleeding) 10/12/2014   Iron deficiency anemia  secondary to blood loss (chronic) 10/12/2014   CKD (chronic kidney disease), stage III (HCC) 10/12/2014   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   HTN (hypertension) 11/07/2013   OA (osteoarthritis) of knee 11/07/2013   Peripheral sensory neuropathy 11/07/2013   Malignant melanoma of skin of chest (HCC) 08/06/2013    Palliative Care Assessment & Plan   Patient Profile: 88 y.o. male  with past medical history of sinoatrial node dysfunction with pacemaker, hypertension, CKD 3B, history of DVT, atrial fibrillation, and traumatic SDH in January 2024 was admitted on 02/22/2023 with acute right MCA and ACA stroke, AKI on CKD 3B, hyperkalemia, metabolic acidosis.  Stroke was likely due to A-fib not on [facility].  Patient not a candidate for [facility] due to history of GI bleed and recent traumatic SDH.  After failed swallow  evaluation and discussions with attending/Dr. Jerral Ralph on 02/24/23 family opted for patient to transition to full comfort care.   Assessment: Principal Problem:   Acute left-sided weakness Active Problems:   CKD (chronic kidney disease), stage III (HCC)   Atrial fibrillation (HCC)   Chronic respiratory failure with hypoxia (HCC)   History of DVT of lower extremity   HTN (hypertension)   Acute renal failure superimposed on stage 3b chronic kidney disease (HCC)   Terminal care  Recommendations/Plan: Continue full comfort measures Continue DNR/DNI as previously documented [facility] pending confirmation of eligibility and bed availability Continue current comfort focused symptom management regimen - no changes PMT will continue to follow and support holistically  Symptom Management Dilaudid PRN pain/dyspnea/increased work of breathing/RR>25 Roxicodone PRN moderate pain/distress Tylenol PRN pain/fever Biotin twice daily Benadryl PRN itching Robinul PRN secretions Haldol PRN agitation/delirium Scheduled Ativan q6h; PRN doses for  anxiety/seizure/sleep/distress Zofran PRN nausea/vomiting Liquifilm Tears PRN dry eye  Goals of Care and Additional Recommendations: Limitations on Scope of Treatment: Full Comfort Care  Code Status:    Code Status Orders  (From admission, onward)           Start     Ordered   02/24/23 1026  Do not attempt resuscitation (DNR) - Comfort care  Continuous  Question Answer Comment  If patient has no pulse and is not breathing Do Not Attempt Resuscitation   In Pre-Arrest Conditions (Patient Is Breathing and Has a Pulse) Provide comfort measures. Relieve any mechanical airway obstruction. Avoid transfer unless required for comfort.   Consent: Discussion documented in EHR or advanced directives reviewed      02/24/23 1027           Code Status History     Date Active Date Inactive Code Status Order ID Comments User Context   02/24/2023 1021 02/24/2023 1027 Do not attempt resuscitation (DNR) - Comfort care [id number]  Maretta Bees, MD Inpatient   02/23/2023 0055 02/24/2023 1021 Full Code [id number]  Briscoe Deutscher, MD ED   10/24/2018 0150 10/26/2018 1823 Full Code [id number]  Pearletha Alfred, NP ED   10/23/2017 1043 10/25/2017 1923 Full Code [id number]  Ihor Austin, MD Inpatient   01/01/2015 1104 01/02/2015 1539 Full Code [id number]  Altamese Dilling, MD Inpatient   10/12/2014 1731 10/15/2014 1922 Full Code [id number]  Shaune Pollack, MD ED      Advance Directive Documentation    Flowsheet Row Most Recent Value  Type of Advance Directive Healthcare Power of Attorney  Oshay Bjork HPOA]  Pre-existing out of facility DNR order (yellow form or pink MOST form) --  "MOST" Form in Place? --       Prognosis:  < 2 weeks  Discharge Planning: Hospice facility  Care plan was discussed with primary RN, patient's daughter  Thank you for allowing the Palliative Medicine Team to assist in the care of this patient.   Haskel Khan, NP  Please contact Palliative Medicine Team  phone at [phone] for questions and concerns.   *Portions of this note are a verbal dictation therefore any spelling and/or grammatical errors are due to the "[employer]" system interpretation.

## 2023-02-25 NOTE — TOC Transition Note (Signed)
Transition of Care [facility]) - CM/SW Discharge Note   Patient Details  Name: Howard Harmon MRN: [id number] Date of Birth: January 19, 1930  [facility]) CM/SW Contact:  Helene Kelp, LCSW Phone Number: 02/25/2023, 3:18 PM   Clinical Narrative:    CSW was contacted by the clinical team regarding the patient needing transportation to support for the discharge disposition to the hospice provider [facility] in [place], [place]).  This Clinical research associate arranged transportation dwith PTAR to the noted Hospice facility [facility] in [place], [place] ([address] [place], [place])  CSW updated the clinical team to transport information above.   CSW provided the bedside nurse with the information above, and the following below.  Nurse report#: [phone] D/C packet   No other needs identified of this Clinical research associate. Please contact TOC if there is additional disposition support needed.    Final next level of care: Hospice Medical Facility Barriers to Discharge: Continued Medical Work up   Patient Goals and CMS Choice  Discharge Placement Hospice facility ([facility] in [place], [place])         Patient to be transferred to facility by: [facility] on the face sheet and in the room with the patient.   Patient and family notified of of transfer: 02/25/23  Discharge Plan and Services Additional resources added to the After Visit Summary for                                       Social Determinants of Health (SDOH) Interventions SDOH Screenings   Food Insecurity: No Food Insecurity (02/23/2023)  Housing: Low Risk  (02/23/2023)  Transportation Needs: No Transportation Needs (02/23/2023)  Utilities: Not At Risk (02/23/2023)  Tobacco Use: Low Risk  (02/23/2023)     Readmission Risk Interventions     No data to display

## 2023-02-25 NOTE — Progress Notes (Signed)
Urine analysis:    Component Value Date/Time   COLORURINE YELLOW (A) 10/24/2018 0118   APPEARANCEUR HAZY (A) 10/24/2018 0118   APPEARANCEUR Clear 09/18/2012 2138   LABSPEC 1.014 10/24/2018 0118   LABSPEC 1.014 09/18/2012 2138   PHURINE 5.0 10/24/2018 0118   GLUCOSEU NEGATIVE 10/24/2018 0118   GLUCOSEU Negative 09/18/2012 2138   HGBUR MODERATE (A) 10/24/2018 0118   BILIRUBINUR NEGATIVE 10/24/2018 0118   BILIRUBINUR Negative 09/18/2012 2138   KETONESUR NEGATIVE 10/24/2018 0118   PROTEINUR NEGATIVE 10/24/2018 0118   NITRITE NEGATIVE 10/24/2018 0118   LEUKOCYTESUR SMALL (A) 10/24/2018 0118   LEUKOCYTESUR Trace 09/18/2012 2138    Sepsis Labs: Lactic Acid, Venous    Component Value Date/Time   LATICACIDVEN 2.1 (HH) 10/24/2018 0154    MICROBIOLOGY: No results found for this or any previous visit (from the past 240 hour(s)).  RADIOLOGY STUDIES/RESULTS: CT HEAD WO CONTRAST ( )  Result Date: 02/23/2023 CLINICAL DATA:   Suspected stroke, according to epic notes left-sided weakness and slurred speech EXAM: CT HEAD WITHOUT CONTRAST TECHNIQUE: Contiguous axial images were obtained from the base of the skull through the vertex without intravenous contrast. RADIATION DOSE REDUCTION: This exam was performed according to the departmental dose-optimization program which includes automated exposure control, adjustment of the mA and/or kV according to patient size and/or use of iterative reconstruction technique. COMPARISON:  Head CT 02/22/2023, 06/09/2021 FINDINGS: Brain: No hemorrhage or intracranial mass. Interim development of hypodensity with loss of gray-white matter differentiation in the right temporal lobe, series 4, image 13, in addition to hypodensity within the right insula/sub insula and right basal ganglia, consistent with acute right MCA infarct. Atrophy and advanced chronic small vessel ischemic changes of the white matter. Stable ventricle size. No mass effect or midline shift. Vascular: Carotid vascular calcification and vertebral calcification. Dolichoectasia of the left vertebral artery. Interim finding of hyperdense distal right MCA, series 4, image 12 and 13, sagittal series 6, image 21-23. Skull: No fracture or suspicious bone lesion Sinuses/Orbits: No acute finding. Other: None IMPRESSION: 1. Interim development of acute right MCA infarct involving the right temporal lobe, right insula/sub insula, and right basal ganglia. No hemorrhage identified. No significant mass effect or midline shift. 2. Interim development of hyperdense distal right MCA consistent with thrombus. 3. Atrophy and advanced chronic small vessel ischemic changes of the white matter. Critical Value/emergent results were called by telephone at the time of interpretation on 02/23/2023 at 4:20 pm to provider Wouk, Wilfred Curtis, MD, who verbally acknowledged these results. Electronically Signed   By: Jasmine Pang M.D.   On: 02/23/2023 16:20    ECHOCARDIOGRAM COMPLETE  Result Date: 02/23/2023    ECHOCARDIOGRAM REPORT   Patient Name:   Howard Harmon Date of Exam: 02/23/2023 Medical Rec #:  [id number]         Height:       71.0 in Accession #:    [id number]        Weight:       168.0 lb Date of Birth:  04-08-1930          BSA:          1.958 m Patient Age:    89 years          BP:           170/95 mmHg Patient Gender: M                 HR:  PROGRESS NOTE        PATIENT DETAILS Name: Howard Harmon Age: 89 y.o. Sex: male Date of Birth: 1930-03-17 Admit Date: 02/22/2023 Admitting Physician Briscoe Deutscher, MD ZOX:WRUEAV, Hardin Negus, MD  Brief Summary: Patient is a 89 y.o.  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  Patient was found to have acute CVA-AKI and acute urinary retention.  See below for further details.    Significant events: 9/19>> admit to [facility] 9/20>> urology placed Foley catheter  Significant studies: 9/19>> CT head: No acute intracranial process 9/20>> renal ultrasound: Severe bilateral hydronephrosis-distended bladder 9/20>> CT head: Interim development of acute right MCA infarct.  Hyperdense distal right MCA consistent with thrombus. 9/20>> carotid Doppler: No significant stenosis 9/20>> echo: EF 50-55%.  Severe mitral valve regurgitation. 9/20>> A1c 5.6 9/20>> LDL: 49  Significant microbiology data: None  Procedures: None  Consults: Neurology Palliative care Urology  Subjective:  Patient in bed resting comfortably, now on full comfort measures, appears to be in no distress.  Objective: Vitals: Blood pressure (!) 147/107, pulse 71, temperature 98.6 F (37 C), temperature source Axillary, resp. rate 14, weight 76.2 kg, SpO2 98%.   Exam:  In bed appears to be in no distress resting  HEENT:atraumatic, normocephalic Chest: B/L clear to auscultation anteriorly CVS:S1S2 regular Abdomen:soft non tender, non distended Extremities:no edema Neurology: Left-sided hemiplegia-dysarthria Skin: no rash   Assessment/Plan:   On 02/24/2023 patient was seen by palliative care team along with hospitalist treatment team, long discussions with made with patient's family he is now transition to full comfort measures, looking for  residential hospice, goal of care is now comfort directed.  All known comfort medications stopped, other medical problems addressed earlier this admission are below.    Acute right MCA territory infarct Likely embolic-A-fib-not on anticoagulation given prior history of SDH Continues to have dense left-sided hemiplegia/dysarthria Workup as above (pacemaker not compatible with MRI) Neurology recommending DAPT indefinitely-Per prior notes-order candidate for Watchman device.    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 labs pending this morning.  Acute urinary retention Foley catheter placed by urology on 9/20 Flomax if able when oral intake possible Per urology-to keep catheter in for at least 1 week before considering voiding trial.  Normocytic anemia Hb stable-close to baseline-likely due to CKD Follow CBC periodically  Chronic atrial fibrillation Paced rhythm Not on any anticoagulation no rate control medications prior to this hospitalization  Sick sinus syndrome-s/p pacemaker implantation Telemetry monitoring  Chronic HFpEF Euvolemic Furosemide on hold due to AKI  Palliative care No family at bedside-apparently per caregiver-one of his daughters is on the way to the hospital Will have goals of care conversation with family-especially if he is unable to swallow safely-he may be a candidate for transitioning to comfort measures.  Reportedly per caregiver at bedside-he was recently enrolled in hospice.  BMI: Estimated body mass index is 23.43 kg/m as calculated from the following:   Height as of 06/09/21: 5\' 11"  (1.803 m).   Weight as of this encounter: 76.2 kg.   Code status:  PROGRESS NOTE        PATIENT DETAILS Name: Howard Harmon Age: 89 y.o. Sex: male Date of Birth: 1930-03-17 Admit Date: 02/22/2023 Admitting Physician Briscoe Deutscher, MD ZOX:WRUEAV, Hardin Negus, MD  Brief Summary: Patient is a 89 y.o.  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  Patient was found to have acute CVA-AKI and acute urinary retention.  See below for further details.    Significant events: 9/19>> admit to [facility] 9/20>> urology placed Foley catheter  Significant studies: 9/19>> CT head: No acute intracranial process 9/20>> renal ultrasound: Severe bilateral hydronephrosis-distended bladder 9/20>> CT head: Interim development of acute right MCA infarct.  Hyperdense distal right MCA consistent with thrombus. 9/20>> carotid Doppler: No significant stenosis 9/20>> echo: EF 50-55%.  Severe mitral valve regurgitation. 9/20>> A1c 5.6 9/20>> LDL: 49  Significant microbiology data: None  Procedures: None  Consults: Neurology Palliative care Urology  Subjective:  Patient in bed resting comfortably, now on full comfort measures, appears to be in no distress.  Objective: Vitals: Blood pressure (!) 147/107, pulse 71, temperature 98.6 F (37 C), temperature source Axillary, resp. rate 14, weight 76.2 kg, SpO2 98%.   Exam:  In bed appears to be in no distress resting  HEENT:atraumatic, normocephalic Chest: B/L clear to auscultation anteriorly CVS:S1S2 regular Abdomen:soft non tender, non distended Extremities:no edema Neurology: Left-sided hemiplegia-dysarthria Skin: no rash   Assessment/Plan:   On 02/24/2023 patient was seen by palliative care team along with hospitalist treatment team, long discussions with made with patient's family he is now transition to full comfort measures, looking for  residential hospice, goal of care is now comfort directed.  All known comfort medications stopped, other medical problems addressed earlier this admission are below.    Acute right MCA territory infarct Likely embolic-A-fib-not on anticoagulation given prior history of SDH Continues to have dense left-sided hemiplegia/dysarthria Workup as above (pacemaker not compatible with MRI) Neurology recommending DAPT indefinitely-Per prior notes-order candidate for Watchman device.    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 labs pending this morning.  Acute urinary retention Foley catheter placed by urology on 9/20 Flomax if able when oral intake possible Per urology-to keep catheter in for at least 1 week before considering voiding trial.  Normocytic anemia Hb stable-close to baseline-likely due to CKD Follow CBC periodically  Chronic atrial fibrillation Paced rhythm Not on any anticoagulation no rate control medications prior to this hospitalization  Sick sinus syndrome-s/p pacemaker implantation Telemetry monitoring  Chronic HFpEF Euvolemic Furosemide on hold due to AKI  Palliative care No family at bedside-apparently per caregiver-one of his daughters is on the way to the hospital Will have goals of care conversation with family-especially if he is unable to swallow safely-he may be a candidate for transitioning to comfort measures.  Reportedly per caregiver at bedside-he was recently enrolled in hospice.  BMI: Estimated body mass index is 23.43 kg/m as calculated from the following:   Height as of 06/09/21: 5\' 11"  (1.803 m).   Weight as of this encounter: 76.2 kg.   Code status:  Urine analysis:    Component Value Date/Time   COLORURINE YELLOW (A) 10/24/2018 0118   APPEARANCEUR HAZY (A) 10/24/2018 0118   APPEARANCEUR Clear 09/18/2012 2138   LABSPEC 1.014 10/24/2018 0118   LABSPEC 1.014 09/18/2012 2138   PHURINE 5.0 10/24/2018 0118   GLUCOSEU NEGATIVE 10/24/2018 0118   GLUCOSEU Negative 09/18/2012 2138   HGBUR MODERATE (A) 10/24/2018 0118   BILIRUBINUR NEGATIVE 10/24/2018 0118   BILIRUBINUR Negative 09/18/2012 2138   KETONESUR NEGATIVE 10/24/2018 0118   PROTEINUR NEGATIVE 10/24/2018 0118   NITRITE NEGATIVE 10/24/2018 0118   LEUKOCYTESUR SMALL (A) 10/24/2018 0118   LEUKOCYTESUR Trace 09/18/2012 2138    Sepsis Labs: Lactic Acid, Venous    Component Value Date/Time   LATICACIDVEN 2.1 (HH) 10/24/2018 0154    MICROBIOLOGY: No results found for this or any previous visit (from the past 240 hour(s)).  RADIOLOGY STUDIES/RESULTS: CT HEAD WO CONTRAST ( )  Result Date: 02/23/2023 CLINICAL DATA:   Suspected stroke, according to epic notes left-sided weakness and slurred speech EXAM: CT HEAD WITHOUT CONTRAST TECHNIQUE: Contiguous axial images were obtained from the base of the skull through the vertex without intravenous contrast. RADIATION DOSE REDUCTION: This exam was performed according to the departmental dose-optimization program which includes automated exposure control, adjustment of the mA and/or kV according to patient size and/or use of iterative reconstruction technique. COMPARISON:  Head CT 02/22/2023, 06/09/2021 FINDINGS: Brain: No hemorrhage or intracranial mass. Interim development of hypodensity with loss of gray-white matter differentiation in the right temporal lobe, series 4, image 13, in addition to hypodensity within the right insula/sub insula and right basal ganglia, consistent with acute right MCA infarct. Atrophy and advanced chronic small vessel ischemic changes of the white matter. Stable ventricle size. No mass effect or midline shift. Vascular: Carotid vascular calcification and vertebral calcification. Dolichoectasia of the left vertebral artery. Interim finding of hyperdense distal right MCA, series 4, image 12 and 13, sagittal series 6, image 21-23. Skull: No fracture or suspicious bone lesion Sinuses/Orbits: No acute finding. Other: None IMPRESSION: 1. Interim development of acute right MCA infarct involving the right temporal lobe, right insula/sub insula, and right basal ganglia. No hemorrhage identified. No significant mass effect or midline shift. 2. Interim development of hyperdense distal right MCA consistent with thrombus. 3. Atrophy and advanced chronic small vessel ischemic changes of the white matter. Critical Value/emergent results were called by telephone at the time of interpretation on 02/23/2023 at 4:20 pm to provider Wouk, Wilfred Curtis, MD, who verbally acknowledged these results. Electronically Signed   By: Jasmine Pang M.D.   On: 02/23/2023 16:20    ECHOCARDIOGRAM COMPLETE  Result Date: 02/23/2023    ECHOCARDIOGRAM REPORT   Patient Name:   Howard Harmon Date of Exam: 02/23/2023 Medical Rec #:  [id number]         Height:       71.0 in Accession #:    [id number]        Weight:       168.0 lb Date of Birth:  04-08-1930          BSA:          1.958 m Patient Age:    89 years          BP:           170/95 mmHg Patient Gender: M                 HR:  PROGRESS NOTE        PATIENT DETAILS Name: Howard Harmon Age: 89 y.o. Sex: male Date of Birth: 1930-03-17 Admit Date: 02/22/2023 Admitting Physician Briscoe Deutscher, MD ZOX:WRUEAV, Hardin Negus, MD  Brief Summary: Patient is a 89 y.o.  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  Patient was found to have acute CVA-AKI and acute urinary retention.  See below for further details.    Significant events: 9/19>> admit to [facility] 9/20>> urology placed Foley catheter  Significant studies: 9/19>> CT head: No acute intracranial process 9/20>> renal ultrasound: Severe bilateral hydronephrosis-distended bladder 9/20>> CT head: Interim development of acute right MCA infarct.  Hyperdense distal right MCA consistent with thrombus. 9/20>> carotid Doppler: No significant stenosis 9/20>> echo: EF 50-55%.  Severe mitral valve regurgitation. 9/20>> A1c 5.6 9/20>> LDL: 49  Significant microbiology data: None  Procedures: None  Consults: Neurology Palliative care Urology  Subjective:  Patient in bed resting comfortably, now on full comfort measures, appears to be in no distress.  Objective: Vitals: Blood pressure (!) 147/107, pulse 71, temperature 98.6 F (37 C), temperature source Axillary, resp. rate 14, weight 76.2 kg, SpO2 98%.   Exam:  In bed appears to be in no distress resting  HEENT:atraumatic, normocephalic Chest: B/L clear to auscultation anteriorly CVS:S1S2 regular Abdomen:soft non tender, non distended Extremities:no edema Neurology: Left-sided hemiplegia-dysarthria Skin: no rash   Assessment/Plan:   On 02/24/2023 patient was seen by palliative care team along with hospitalist treatment team, long discussions with made with patient's family he is now transition to full comfort measures, looking for  residential hospice, goal of care is now comfort directed.  All known comfort medications stopped, other medical problems addressed earlier this admission are below.    Acute right MCA territory infarct Likely embolic-A-fib-not on anticoagulation given prior history of SDH Continues to have dense left-sided hemiplegia/dysarthria Workup as above (pacemaker not compatible with MRI) Neurology recommending DAPT indefinitely-Per prior notes-order candidate for Watchman device.    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 labs pending this morning.  Acute urinary retention Foley catheter placed by urology on 9/20 Flomax if able when oral intake possible Per urology-to keep catheter in for at least 1 week before considering voiding trial.  Normocytic anemia Hb stable-close to baseline-likely due to CKD Follow CBC periodically  Chronic atrial fibrillation Paced rhythm Not on any anticoagulation no rate control medications prior to this hospitalization  Sick sinus syndrome-s/p pacemaker implantation Telemetry monitoring  Chronic HFpEF Euvolemic Furosemide on hold due to AKI  Palliative care No family at bedside-apparently per caregiver-one of his daughters is on the way to the hospital Will have goals of care conversation with family-especially if he is unable to swallow safely-he may be a candidate for transitioning to comfort measures.  Reportedly per caregiver at bedside-he was recently enrolled in hospice.  BMI: Estimated body mass index is 23.43 kg/m as calculated from the following:   Height as of 06/09/21: 5\' 11"  (1.803 m).   Weight as of this encounter: 76.2 kg.   Code status:  PROGRESS NOTE        PATIENT DETAILS Name: Howard Harmon Age: 89 y.o. Sex: male Date of Birth: 1930-03-17 Admit Date: 02/22/2023 Admitting Physician Briscoe Deutscher, MD ZOX:WRUEAV, Hardin Negus, MD  Brief Summary: Patient is a 89 y.o.  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  Patient was found to have acute CVA-AKI and acute urinary retention.  See below for further details.    Significant events: 9/19>> admit to [facility] 9/20>> urology placed Foley catheter  Significant studies: 9/19>> CT head: No acute intracranial process 9/20>> renal ultrasound: Severe bilateral hydronephrosis-distended bladder 9/20>> CT head: Interim development of acute right MCA infarct.  Hyperdense distal right MCA consistent with thrombus. 9/20>> carotid Doppler: No significant stenosis 9/20>> echo: EF 50-55%.  Severe mitral valve regurgitation. 9/20>> A1c 5.6 9/20>> LDL: 49  Significant microbiology data: None  Procedures: None  Consults: Neurology Palliative care Urology  Subjective:  Patient in bed resting comfortably, now on full comfort measures, appears to be in no distress.  Objective: Vitals: Blood pressure (!) 147/107, pulse 71, temperature 98.6 F (37 C), temperature source Axillary, resp. rate 14, weight 76.2 kg, SpO2 98%.   Exam:  In bed appears to be in no distress resting  HEENT:atraumatic, normocephalic Chest: B/L clear to auscultation anteriorly CVS:S1S2 regular Abdomen:soft non tender, non distended Extremities:no edema Neurology: Left-sided hemiplegia-dysarthria Skin: no rash   Assessment/Plan:   On 02/24/2023 patient was seen by palliative care team along with hospitalist treatment team, long discussions with made with patient's family he is now transition to full comfort measures, looking for  residential hospice, goal of care is now comfort directed.  All known comfort medications stopped, other medical problems addressed earlier this admission are below.    Acute right MCA territory infarct Likely embolic-A-fib-not on anticoagulation given prior history of SDH Continues to have dense left-sided hemiplegia/dysarthria Workup as above (pacemaker not compatible with MRI) Neurology recommending DAPT indefinitely-Per prior notes-order candidate for Watchman device.    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 labs pending this morning.  Acute urinary retention Foley catheter placed by urology on 9/20 Flomax if able when oral intake possible Per urology-to keep catheter in for at least 1 week before considering voiding trial.  Normocytic anemia Hb stable-close to baseline-likely due to CKD Follow CBC periodically  Chronic atrial fibrillation Paced rhythm Not on any anticoagulation no rate control medications prior to this hospitalization  Sick sinus syndrome-s/p pacemaker implantation Telemetry monitoring  Chronic HFpEF Euvolemic Furosemide on hold due to AKI  Palliative care No family at bedside-apparently per caregiver-one of his daughters is on the way to the hospital Will have goals of care conversation with family-especially if he is unable to swallow safely-he may be a candidate for transitioning to comfort measures.  Reportedly per caregiver at bedside-he was recently enrolled in hospice.  BMI: Estimated body mass index is 23.43 kg/m as calculated from the following:   Height as of 06/09/21: 5\' 11"  (1.803 m).   Weight as of this encounter: 76.2 kg.   Code status:  PROGRESS NOTE        PATIENT DETAILS Name: Howard Harmon Age: 89 y.o. Sex: male Date of Birth: 1930-03-17 Admit Date: 02/22/2023 Admitting Physician Briscoe Deutscher, MD ZOX:WRUEAV, Hardin Negus, MD  Brief Summary: Patient is a 89 y.o.  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  Patient was found to have acute CVA-AKI and acute urinary retention.  See below for further details.    Significant events: 9/19>> admit to [facility] 9/20>> urology placed Foley catheter  Significant studies: 9/19>> CT head: No acute intracranial process 9/20>> renal ultrasound: Severe bilateral hydronephrosis-distended bladder 9/20>> CT head: Interim development of acute right MCA infarct.  Hyperdense distal right MCA consistent with thrombus. 9/20>> carotid Doppler: No significant stenosis 9/20>> echo: EF 50-55%.  Severe mitral valve regurgitation. 9/20>> A1c 5.6 9/20>> LDL: 49  Significant microbiology data: None  Procedures: None  Consults: Neurology Palliative care Urology  Subjective:  Patient in bed resting comfortably, now on full comfort measures, appears to be in no distress.  Objective: Vitals: Blood pressure (!) 147/107, pulse 71, temperature 98.6 F (37 C), temperature source Axillary, resp. rate 14, weight 76.2 kg, SpO2 98%.   Exam:  In bed appears to be in no distress resting  HEENT:atraumatic, normocephalic Chest: B/L clear to auscultation anteriorly CVS:S1S2 regular Abdomen:soft non tender, non distended Extremities:no edema Neurology: Left-sided hemiplegia-dysarthria Skin: no rash   Assessment/Plan:   On 02/24/2023 patient was seen by palliative care team along with hospitalist treatment team, long discussions with made with patient's family he is now transition to full comfort measures, looking for  residential hospice, goal of care is now comfort directed.  All known comfort medications stopped, other medical problems addressed earlier this admission are below.    Acute right MCA territory infarct Likely embolic-A-fib-not on anticoagulation given prior history of SDH Continues to have dense left-sided hemiplegia/dysarthria Workup as above (pacemaker not compatible with MRI) Neurology recommending DAPT indefinitely-Per prior notes-order candidate for Watchman device.    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 labs pending this morning.  Acute urinary retention Foley catheter placed by urology on 9/20 Flomax if able when oral intake possible Per urology-to keep catheter in for at least 1 week before considering voiding trial.  Normocytic anemia Hb stable-close to baseline-likely due to CKD Follow CBC periodically  Chronic atrial fibrillation Paced rhythm Not on any anticoagulation no rate control medications prior to this hospitalization  Sick sinus syndrome-s/p pacemaker implantation Telemetry monitoring  Chronic HFpEF Euvolemic Furosemide on hold due to AKI  Palliative care No family at bedside-apparently per caregiver-one of his daughters is on the way to the hospital Will have goals of care conversation with family-especially if he is unable to swallow safely-he may be a candidate for transitioning to comfort measures.  Reportedly per caregiver at bedside-he was recently enrolled in hospice.  BMI: Estimated body mass index is 23.43 kg/m as calculated from the following:   Height as of 06/09/21: 5\' 11"  (1.803 m).   Weight as of this encounter: 76.2 kg.   Code status:

## 2023-02-25 NOTE — Plan of Care (Signed)
Problem: Education: Goal: Knowledge of disease or condition will improve Outcome: Progressing Goal: Knowledge of secondary prevention will improve (MUST DOCUMENT ALL) Outcome: Progressing Goal: Knowledge of patient specific risk factors will improve Loraine Leriche N/A or DELETE if not current risk factor) Outcome: Progressing   Problem: Ischemic Stroke/TIA Tissue Perfusion: Goal: Complications of ischemic stroke/TIA will be minimized Outcome: Progressing   Problem: Coping: Goal: Will verbalize positive feelings about self Outcome: Progressing Goal: Will identify appropriate support needs Outcome: Progressing   Problem: Health Behavior/Discharge Planning: Goal: Ability to manage health-related needs will improve Outcome: Progressing Goal: Goals will be collaboratively established with patient/family Outcome: Progressing   Problem: Self-Care: Goal: Ability to participate in self-care as condition permits will improve Outcome: Progressing Goal: Verbalization of feelings and concerns over difficulty with self-care will improve Outcome: Progressing Goal: Ability to communicate needs accurately will improve Outcome: Progressing   Problem: Nutrition: Goal: Risk of aspiration will decrease Outcome: Progressing Goal: Dietary intake will improve Outcome: Progressing   Problem: Education: Goal: Knowledge of General Education information will improve Description: Including pain rating scale, medication(s)/side effects and non-pharmacologic comfort measures Outcome: Progressing   Problem: Health Behavior/Discharge Planning: Goal: Ability to manage health-related needs will improve Outcome: Progressing   Problem: Clinical Measurements: Goal: Ability to maintain clinical measurements within normal limits will improve Outcome: Progressing Goal: Will remain free from infection Outcome: Progressing Goal: Diagnostic test results will improve Outcome: Progressing Goal: Respiratory  complications will improve Outcome: Progressing Goal: Cardiovascular complication will be avoided Outcome: Progressing   Problem: Activity: Goal: Risk for activity intolerance will decrease Outcome: Progressing   Problem: Nutrition: Goal: Adequate nutrition will be maintained Outcome: Progressing   Problem: Coping: Goal: Level of anxiety will decrease Outcome: Progressing   Problem: Elimination: Goal: Will not experience complications related to bowel motility Outcome: Progressing Goal: Will not experience complications related to urinary retention Outcome: Progressing   Problem: Pain Managment: Goal: General experience of comfort will improve Outcome: Progressing   Problem: Safety: Goal: Ability to remain free from injury will improve Outcome: Progressing   Problem: Skin Integrity: Goal: Risk for impaired skin integrity will decrease Outcome: Progressing   Problem: Education: Goal: Knowledge of the prescribed therapeutic regimen will improve Outcome: Progressing   Problem: Coping: Goal: Ability to identify and develop effective coping behavior will improve Outcome: Progressing   Problem: Clinical Measurements: Goal: Quality of life will improve Outcome: Progressing   Problem: Respiratory: Goal: Verbalizations of increased ease of respirations will increase Outcome: Progressing   Problem: Role Relationship: Goal: Family's ability to cope with current situation will improve Outcome: Progressing Goal: Ability to verbalize concerns, feelings, and thoughts to partner or family member will improve Outcome: Progressing   Problem: Pain Management: Goal: Satisfaction with pain management regimen will improve Outcome: Progressing

## 2023-02-25 NOTE — Plan of Care (Signed)
  Problem: Education: Goal: Knowledge of disease or condition will improve Outcome: Completed/Met Goal: Knowledge of secondary prevention will improve (MUST DOCUMENT ALL) Outcome: Completed/Met Goal: Knowledge of patient specific risk factors will improve Loraine Leriche N/A or DELETE if not current risk factor) Outcome: Completed/Met   Problem: Ischemic Stroke/TIA Tissue Perfusion: Goal: Complications of ischemic stroke/TIA will be minimized Outcome: Completed/Met   Problem: Coping: Goal: Will verbalize positive feelings about self Outcome: Completed/Met Goal: Will identify appropriate support needs Outcome: Completed/Met   Problem: Health Behavior/Discharge Planning: Goal: Ability to manage health-related needs will improve Outcome: Completed/Met Goal: Goals will be collaboratively established with patient/family Outcome: Completed/Met   Problem: Self-Care: Goal: Ability to participate in self-care as condition permits will improve Outcome: Completed/Met Goal: Verbalization of feelings and concerns over difficulty with self-care will improve Outcome: Completed/Met Goal: Ability to communicate needs accurately will improve Outcome: Completed/Met   Problem: Nutrition: Goal: Risk of aspiration will decrease Outcome: Completed/Met Goal: Dietary intake will improve Outcome: Completed/Met   Problem: Education: Goal: Knowledge of General Education information will improve Description: Including pain rating scale, medication(s)/side effects and non-pharmacologic comfort measures Outcome: Completed/Met   Problem: Health Behavior/Discharge Planning: Goal: Ability to manage health-related needs will improve Outcome: Completed/Met   Problem: Clinical Measurements: Goal: Ability to maintain clinical measurements within normal limits will improve Outcome: Completed/Met Goal: Will remain free from infection Outcome: Completed/Met Goal: Diagnostic test results will improve Outcome:  Completed/Met Goal: Respiratory complications will improve Outcome: Completed/Met Goal: Cardiovascular complication will be avoided Outcome: Completed/Met   Problem: Activity: Goal: Risk for activity intolerance will decrease Outcome: Completed/Met   Problem: Nutrition: Goal: Adequate nutrition will be maintained Outcome: Completed/Met   Problem: Coping: Goal: Level of anxiety will decrease Outcome: Completed/Met   Problem: Elimination: Goal: Will not experience complications related to bowel motility Outcome: Completed/Met Goal: Will not experience complications related to urinary retention Outcome: Completed/Met   Problem: Pain Managment: Goal: General experience of comfort will improve Outcome: Completed/Met   Problem: Safety: Goal: Ability to remain free from injury will improve Outcome: Completed/Met   Problem: Skin Integrity: Goal: Risk for impaired skin integrity will decrease Outcome: Completed/Met   Problem: Education: Goal: Knowledge of the prescribed therapeutic regimen will improve Outcome: Completed/Met   Problem: Coping: Goal: Ability to identify and develop effective coping behavior will improve Outcome: Completed/Met   Problem: Clinical Measurements: Goal: Quality of life will improve Outcome: Completed/Met   Problem: Respiratory: Goal: Verbalizations of increased ease of respirations will increase Outcome: Completed/Met   Problem: Role Relationship: Goal: Family's ability to cope with current situation will improve Outcome: Completed/Met Goal: Ability to verbalize concerns, feelings, and thoughts to partner or family member will improve Outcome: Completed/Met   Problem: Pain Management: Goal: Satisfaction with pain management regimen will improve Outcome: Completed/Met

## 2023-02-25 NOTE — Progress Notes (Addendum)
[facility] Liaison Note  Referral received for patient/family interest in [facility]. Chart under review by [facility] physician.   Bed offered and accepted for transfer today to [facility]. Unit RN please call report to [phone] prior to patient leaving the unit. Please send signed DNR and paperwork with patient.   Please leave all IV access and ports in place.   Please call with any questions or concerns. Thank you  Dionicio Stall, LCSW [facility] hospital liaison [phone]

## 2023-02-25 NOTE — Discharge Summary (Signed)
Howard Harmon UJW:[id number] DOB: December 30, 1929 DOA: 02/22/2023  PCP: Danella Penton, MD  Admit date: 02/22/2023  Discharge date: 02/25/2023  Admitted From: Home   Disposition:  Residential Hospice   Recommendations for Outpatient Follow-up:   Follow up with PCP in 1-2 weeks  PCP Please obtain BMP/CBC, 2 view CXR in 1week,  (see Discharge instructions)   PCP Please follow up on the following pending results:   Discharge Condition: Guarded   CODE STATUS: DNR      Chief Complaint  Patient presents with   Code Stroke     Brief history of present illness from the day of admission and additional interim summary    89 y.o.  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  Patient was found to have acute CVA-AKI and acute urinary retention.  See below for further details.     Significant events: 9/19>> admit to [facility] 9/20>> urology placed Foley catheter   Significant studies: 9/19>> CT head: No acute intracranial process 9/20>> renal ultrasound: Severe bilateral hydronephrosis-distended bladder 9/20>> CT head: Interim development of acute right MCA infarct.  Hyperdense distal right MCA consistent with thrombus. 9/20>> carotid Doppler: No significant stenosis 9/20>> echo: EF 50-55%.  Severe mitral valve regurgitation. 9/20>> A1c 5.6 9/20>> LDL: 49   Significant microbiology data: None   Procedures: None   Consults: Neurology Palliative care Urology                                                                 Hospital Course   On 02/24/2023 patient was seen by palliative care team along with hospitalist treatment team, long discussions with made with  patient's family he is now transition to full comfort measures, looking for residential hospice, goal of care is now comfort directed.  All known comfort medications stopped, other medical problems addressed earlier this admission are below.       Acute right MCA territory infarct Likely embolic-A-fib-not on anticoagulation given prior history of SDH Continues to have dense left-sided hemiplegia/dysarthria Workup as above (pacemaker not compatible with MRI) Neurology recommending DAPT indefinitely-Per prior notes-order candidate for Watchman device.       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 labs pending this morning.   Acute urinary retention Foley catheter placed by urology on 9/20 Flomax if able when oral intake possible Per urology-to keep catheter in for at least 1 week before considering voiding trial.   Normocytic anemia Hb stable-close to baseline-likely due to CKD Follow CBC  valve is  calcified. There is severe calcifcation of the aortic valve. There is moderate thickening of the aortic valve. There is moderate aortic valve annular calcification. Aortic valve regurgitation is trivial. Mild to moderate  aortic stenosis is present. Aortic valve mean gradient measures 26.5 mmHg. Aortic valve peak gradient measures 48.2 mmHg. Aortic valve area, by VTI measures 1.07 cm. Pulmonic Valve: The pulmonic valve was not well visualized. Pulmonic valve regurgitation is not visualized. Aorta: The aortic root is normal in size and structure. IAS/Shunts: No atrial level shunt detected by color flow Doppler. Additional Comments: A device lead is visualized.  LEFT VENTRICLE PLAX 2D LVIDd:         4.60 cm LVIDs:         3.50 cm LV PW:         1.40 cm LV IVS:        1.40 cm LVOT diam:     2.00 cm LV SV:         75 LV SV Index:   39 LVOT Area:     3.14 cm  RIGHT VENTRICLE TAPSE (M-mode): 0.8 cm LEFT ATRIUM              Index        RIGHT ATRIUM           Index LA diam:        4.70 cm  2.40 cm/m   RA Area:     22.70 cm LA Vol (A2C):   87.5 ml  44.69 ml/m  RA Volume:   67.30 ml  34.37 ml/m LA Vol (A4C):   131.0 ml 66.91 ml/m LA Biplane Vol: 112.0 ml 57.20 ml/m  AORTIC VALVE                     PULMONIC VALVE AV Area (Vmax):    1.08 cm      PV Vmax:       0.86 m/s AV Area (Vmean):   1.02 cm      PV Peak grad:  3.0 mmHg AV Area (VTI):     1.07 cm AV Vmax:           347.00 cm/s AV Vmean:          238.000 cm/s AV VTI:            0.704 m AV Peak Grad:      48.2 mmHg AV Mean Grad:      26.5 mmHg LVOT Vmax:         119.00 cm/s LVOT Vmean:        77.000 cm/s LVOT VTI:          0.240 m LVOT/AV VTI ratio: 0.34  AORTA Ao Root diam: 2.80 cm Ao Asc diam:  3.60 cm TRICUSPID VALVE TR Peak grad:   48.2 mmHg TR Vmax:        347.00 cm/s  SHUNTS Systemic VTI:  0.24 m Systemic Diam: 2.00 cm Aditya Sabharwal Electronically signed by Dorthula Nettles Signature Date/Time: 02/23/2023/3:46:33 PM    Final    VAS US CAROTID  Result  Date: 02/23/2023 Carotid Arterial Duplex Study Patient Name:  Howard Harmon  Date of Exam:   02/23/2023 Medical Rec #: [id number]          Accession #:    [id number] Date of Birth: May 07, 1930           Patient Gender: M Patient Age:   89 years Exam Location:  [facility]  valve is  calcified. There is severe calcifcation of the aortic valve. There is moderate thickening of the aortic valve. There is moderate aortic valve annular calcification. Aortic valve regurgitation is trivial. Mild to moderate  aortic stenosis is present. Aortic valve mean gradient measures 26.5 mmHg. Aortic valve peak gradient measures 48.2 mmHg. Aortic valve area, by VTI measures 1.07 cm. Pulmonic Valve: The pulmonic valve was not well visualized. Pulmonic valve regurgitation is not visualized. Aorta: The aortic root is normal in size and structure. IAS/Shunts: No atrial level shunt detected by color flow Doppler. Additional Comments: A device lead is visualized.  LEFT VENTRICLE PLAX 2D LVIDd:         4.60 cm LVIDs:         3.50 cm LV PW:         1.40 cm LV IVS:        1.40 cm LVOT diam:     2.00 cm LV SV:         75 LV SV Index:   39 LVOT Area:     3.14 cm  RIGHT VENTRICLE TAPSE (M-mode): 0.8 cm LEFT ATRIUM              Index        RIGHT ATRIUM           Index LA diam:        4.70 cm  2.40 cm/m   RA Area:     22.70 cm LA Vol (A2C):   87.5 ml  44.69 ml/m  RA Volume:   67.30 ml  34.37 ml/m LA Vol (A4C):   131.0 ml 66.91 ml/m LA Biplane Vol: 112.0 ml 57.20 ml/m  AORTIC VALVE                     PULMONIC VALVE AV Area (Vmax):    1.08 cm      PV Vmax:       0.86 m/s AV Area (Vmean):   1.02 cm      PV Peak grad:  3.0 mmHg AV Area (VTI):     1.07 cm AV Vmax:           347.00 cm/s AV Vmean:          238.000 cm/s AV VTI:            0.704 m AV Peak Grad:      48.2 mmHg AV Mean Grad:      26.5 mmHg LVOT Vmax:         119.00 cm/s LVOT Vmean:        77.000 cm/s LVOT VTI:          0.240 m LVOT/AV VTI ratio: 0.34  AORTA Ao Root diam: 2.80 cm Ao Asc diam:  3.60 cm TRICUSPID VALVE TR Peak grad:   48.2 mmHg TR Vmax:        347.00 cm/s  SHUNTS Systemic VTI:  0.24 m Systemic Diam: 2.00 cm Aditya Sabharwal Electronically signed by Dorthula Nettles Signature Date/Time: 02/23/2023/3:46:33 PM    Final    VAS US CAROTID  Result  Date: 02/23/2023 Carotid Arterial Duplex Study Patient Name:  Howard Harmon  Date of Exam:   02/23/2023 Medical Rec #: [id number]          Accession #:    [id number] Date of Birth: May 07, 1930           Patient Gender: M Patient Age:   89 years Exam Location:  [facility]  Howard Harmon UJW:[id number] DOB: December 30, 1929 DOA: 02/22/2023  PCP: Danella Penton, MD  Admit date: 02/22/2023  Discharge date: 02/25/2023  Admitted From: Home   Disposition:  Residential Hospice   Recommendations for Outpatient Follow-up:   Follow up with PCP in 1-2 weeks  PCP Please obtain BMP/CBC, 2 view CXR in 1week,  (see Discharge instructions)   PCP Please follow up on the following pending results:   Discharge Condition: Guarded   CODE STATUS: DNR      Chief Complaint  Patient presents with   Code Stroke     Brief history of present illness from the day of admission and additional interim summary    89 y.o.  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  Patient was found to have acute CVA-AKI and acute urinary retention.  See below for further details.     Significant events: 9/19>> admit to [facility] 9/20>> urology placed Foley catheter   Significant studies: 9/19>> CT head: No acute intracranial process 9/20>> renal ultrasound: Severe bilateral hydronephrosis-distended bladder 9/20>> CT head: Interim development of acute right MCA infarct.  Hyperdense distal right MCA consistent with thrombus. 9/20>> carotid Doppler: No significant stenosis 9/20>> echo: EF 50-55%.  Severe mitral valve regurgitation. 9/20>> A1c 5.6 9/20>> LDL: 49   Significant microbiology data: None   Procedures: None   Consults: Neurology Palliative care Urology                                                                 Hospital Course   On 02/24/2023 patient was seen by palliative care team along with hospitalist treatment team, long discussions with made with  patient's family he is now transition to full comfort measures, looking for residential hospice, goal of care is now comfort directed.  All known comfort medications stopped, other medical problems addressed earlier this admission are below.       Acute right MCA territory infarct Likely embolic-A-fib-not on anticoagulation given prior history of SDH Continues to have dense left-sided hemiplegia/dysarthria Workup as above (pacemaker not compatible with MRI) Neurology recommending DAPT indefinitely-Per prior notes-order candidate for Watchman device.       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 labs pending this morning.   Acute urinary retention Foley catheter placed by urology on 9/20 Flomax if able when oral intake possible Per urology-to keep catheter in for at least 1 week before considering voiding trial.   Normocytic anemia Hb stable-close to baseline-likely due to CKD Follow CBC  Howard Harmon UJW:[id number] DOB: December 30, 1929 DOA: 02/22/2023  PCP: Danella Penton, MD  Admit date: 02/22/2023  Discharge date: 02/25/2023  Admitted From: Home   Disposition:  Residential Hospice   Recommendations for Outpatient Follow-up:   Follow up with PCP in 1-2 weeks  PCP Please obtain BMP/CBC, 2 view CXR in 1week,  (see Discharge instructions)   PCP Please follow up on the following pending results:   Discharge Condition: Guarded   CODE STATUS: DNR      Chief Complaint  Patient presents with   Code Stroke     Brief history of present illness from the day of admission and additional interim summary    89 y.o.  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  Patient was found to have acute CVA-AKI and acute urinary retention.  See below for further details.     Significant events: 9/19>> admit to [facility] 9/20>> urology placed Foley catheter   Significant studies: 9/19>> CT head: No acute intracranial process 9/20>> renal ultrasound: Severe bilateral hydronephrosis-distended bladder 9/20>> CT head: Interim development of acute right MCA infarct.  Hyperdense distal right MCA consistent with thrombus. 9/20>> carotid Doppler: No significant stenosis 9/20>> echo: EF 50-55%.  Severe mitral valve regurgitation. 9/20>> A1c 5.6 9/20>> LDL: 49   Significant microbiology data: None   Procedures: None   Consults: Neurology Palliative care Urology                                                                 Hospital Course   On 02/24/2023 patient was seen by palliative care team along with hospitalist treatment team, long discussions with made with  patient's family he is now transition to full comfort measures, looking for residential hospice, goal of care is now comfort directed.  All known comfort medications stopped, other medical problems addressed earlier this admission are below.       Acute right MCA territory infarct Likely embolic-A-fib-not on anticoagulation given prior history of SDH Continues to have dense left-sided hemiplegia/dysarthria Workup as above (pacemaker not compatible with MRI) Neurology recommending DAPT indefinitely-Per prior notes-order candidate for Watchman device.       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 labs pending this morning.   Acute urinary retention Foley catheter placed by urology on 9/20 Flomax if able when oral intake possible Per urology-to keep catheter in for at least 1 week before considering voiding trial.   Normocytic anemia Hb stable-close to baseline-likely due to CKD Follow CBC  valve is  calcified. There is severe calcifcation of the aortic valve. There is moderate thickening of the aortic valve. There is moderate aortic valve annular calcification. Aortic valve regurgitation is trivial. Mild to moderate  aortic stenosis is present. Aortic valve mean gradient measures 26.5 mmHg. Aortic valve peak gradient measures 48.2 mmHg. Aortic valve area, by VTI measures 1.07 cm. Pulmonic Valve: The pulmonic valve was not well visualized. Pulmonic valve regurgitation is not visualized. Aorta: The aortic root is normal in size and structure. IAS/Shunts: No atrial level shunt detected by color flow Doppler. Additional Comments: A device lead is visualized.  LEFT VENTRICLE PLAX 2D LVIDd:         4.60 cm LVIDs:         3.50 cm LV PW:         1.40 cm LV IVS:        1.40 cm LVOT diam:     2.00 cm LV SV:         75 LV SV Index:   39 LVOT Area:     3.14 cm  RIGHT VENTRICLE TAPSE (M-mode): 0.8 cm LEFT ATRIUM              Index        RIGHT ATRIUM           Index LA diam:        4.70 cm  2.40 cm/m   RA Area:     22.70 cm LA Vol (A2C):   87.5 ml  44.69 ml/m  RA Volume:   67.30 ml  34.37 ml/m LA Vol (A4C):   131.0 ml 66.91 ml/m LA Biplane Vol: 112.0 ml 57.20 ml/m  AORTIC VALVE                     PULMONIC VALVE AV Area (Vmax):    1.08 cm      PV Vmax:       0.86 m/s AV Area (Vmean):   1.02 cm      PV Peak grad:  3.0 mmHg AV Area (VTI):     1.07 cm AV Vmax:           347.00 cm/s AV Vmean:          238.000 cm/s AV VTI:            0.704 m AV Peak Grad:      48.2 mmHg AV Mean Grad:      26.5 mmHg LVOT Vmax:         119.00 cm/s LVOT Vmean:        77.000 cm/s LVOT VTI:          0.240 m LVOT/AV VTI ratio: 0.34  AORTA Ao Root diam: 2.80 cm Ao Asc diam:  3.60 cm TRICUSPID VALVE TR Peak grad:   48.2 mmHg TR Vmax:        347.00 cm/s  SHUNTS Systemic VTI:  0.24 m Systemic Diam: 2.00 cm Aditya Sabharwal Electronically signed by Dorthula Nettles Signature Date/Time: 02/23/2023/3:46:33 PM    Final    VAS US CAROTID  Result  Date: 02/23/2023 Carotid Arterial Duplex Study Patient Name:  Howard Harmon  Date of Exam:   02/23/2023 Medical Rec #: [id number]          Accession #:    [id number] Date of Birth: May 07, 1930           Patient Gender: M Patient Age:   89 years Exam Location:  [facility]  Howard Harmon UJW:[id number] DOB: December 30, 1929 DOA: 02/22/2023  PCP: Danella Penton, MD  Admit date: 02/22/2023  Discharge date: 02/25/2023  Admitted From: Home   Disposition:  Residential Hospice   Recommendations for Outpatient Follow-up:   Follow up with PCP in 1-2 weeks  PCP Please obtain BMP/CBC, 2 view CXR in 1week,  (see Discharge instructions)   PCP Please follow up on the following pending results:   Discharge Condition: Guarded   CODE STATUS: DNR      Chief Complaint  Patient presents with   Code Stroke     Brief history of present illness from the day of admission and additional interim summary    89 y.o.  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  Patient was found to have acute CVA-AKI and acute urinary retention.  See below for further details.     Significant events: 9/19>> admit to [facility] 9/20>> urology placed Foley catheter   Significant studies: 9/19>> CT head: No acute intracranial process 9/20>> renal ultrasound: Severe bilateral hydronephrosis-distended bladder 9/20>> CT head: Interim development of acute right MCA infarct.  Hyperdense distal right MCA consistent with thrombus. 9/20>> carotid Doppler: No significant stenosis 9/20>> echo: EF 50-55%.  Severe mitral valve regurgitation. 9/20>> A1c 5.6 9/20>> LDL: 49   Significant microbiology data: None   Procedures: None   Consults: Neurology Palliative care Urology                                                                 Hospital Course   On 02/24/2023 patient was seen by palliative care team along with hospitalist treatment team, long discussions with made with  patient's family he is now transition to full comfort measures, looking for residential hospice, goal of care is now comfort directed.  All known comfort medications stopped, other medical problems addressed earlier this admission are below.       Acute right MCA territory infarct Likely embolic-A-fib-not on anticoagulation given prior history of SDH Continues to have dense left-sided hemiplegia/dysarthria Workup as above (pacemaker not compatible with MRI) Neurology recommending DAPT indefinitely-Per prior notes-order candidate for Watchman device.       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 labs pending this morning.   Acute urinary retention Foley catheter placed by urology on 9/20 Flomax if able when oral intake possible Per urology-to keep catheter in for at least 1 week before considering voiding trial.   Normocytic anemia Hb stable-close to baseline-likely due to CKD Follow CBC  Howard Harmon UJW:[id number] DOB: December 30, 1929 DOA: 02/22/2023  PCP: Danella Penton, MD  Admit date: 02/22/2023  Discharge date: 02/25/2023  Admitted From: Home   Disposition:  Residential Hospice   Recommendations for Outpatient Follow-up:   Follow up with PCP in 1-2 weeks  PCP Please obtain BMP/CBC, 2 view CXR in 1week,  (see Discharge instructions)   PCP Please follow up on the following pending results:   Discharge Condition: Guarded   CODE STATUS: DNR      Chief Complaint  Patient presents with   Code Stroke     Brief history of present illness from the day of admission and additional interim summary    89 y.o.  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  Patient was found to have acute CVA-AKI and acute urinary retention.  See below for further details.     Significant events: 9/19>> admit to [facility] 9/20>> urology placed Foley catheter   Significant studies: 9/19>> CT head: No acute intracranial process 9/20>> renal ultrasound: Severe bilateral hydronephrosis-distended bladder 9/20>> CT head: Interim development of acute right MCA infarct.  Hyperdense distal right MCA consistent with thrombus. 9/20>> carotid Doppler: No significant stenosis 9/20>> echo: EF 50-55%.  Severe mitral valve regurgitation. 9/20>> A1c 5.6 9/20>> LDL: 49   Significant microbiology data: None   Procedures: None   Consults: Neurology Palliative care Urology                                                                 Hospital Course   On 02/24/2023 patient was seen by palliative care team along with hospitalist treatment team, long discussions with made with  patient's family he is now transition to full comfort measures, looking for residential hospice, goal of care is now comfort directed.  All known comfort medications stopped, other medical problems addressed earlier this admission are below.       Acute right MCA territory infarct Likely embolic-A-fib-not on anticoagulation given prior history of SDH Continues to have dense left-sided hemiplegia/dysarthria Workup as above (pacemaker not compatible with MRI) Neurology recommending DAPT indefinitely-Per prior notes-order candidate for Watchman device.       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 labs pending this morning.   Acute urinary retention Foley catheter placed by urology on 9/20 Flomax if able when oral intake possible Per urology-to keep catheter in for at least 1 week before considering voiding trial.   Normocytic anemia Hb stable-close to baseline-likely due to CKD Follow CBC

## 2023-03-06 DEATH — deceased

## 2023-03-29 NOTE — Plan of Care (Signed)
[facility] Tonsillectomy/Adenoidectomy, Postoperative PEDS care plan entered in error.

## 2023-04-30 ENCOUNTER — Encounter: Payer: Self-pay | Admitting: Oncology
